# Patient Record
Sex: Female | Born: 1945 | Race: White | Hispanic: No | Marital: Married | Smoking: Never smoker
Health system: Southern US, Community
[De-identification: ages and names within clinical notes are randomized; demographics above are authoritative.]

## PROBLEM LIST (undated history)

## (undated) DIAGNOSIS — F0390 Unspecified dementia without behavioral disturbance: Secondary | ICD-10-CM

## (undated) DIAGNOSIS — D61818 Other pancytopenia: Secondary | ICD-10-CM

## (undated) DIAGNOSIS — F319 Bipolar disorder, unspecified: Secondary | ICD-10-CM

## (undated) DIAGNOSIS — I639 Cerebral infarction, unspecified: Secondary | ICD-10-CM

## (undated) HISTORY — PX: CHOLECYSTECTOMY: SHX55

## (undated) HISTORY — DX: Other pancytopenia: D61.818

---

## 1997-11-01 ENCOUNTER — Ambulatory Visit (HOSPITAL_COMMUNITY): Admission: RE | Admit: 1997-11-01 | Discharge: 1997-11-01 | Payer: Self-pay | Admitting: Specialist

## 1997-11-19 ENCOUNTER — Ambulatory Visit (HOSPITAL_COMMUNITY): Admission: RE | Admit: 1997-11-19 | Discharge: 1997-11-19 | Payer: Self-pay | Admitting: Specialist

## 1997-11-22 ENCOUNTER — Ambulatory Visit (HOSPITAL_COMMUNITY): Admission: RE | Admit: 1997-11-22 | Discharge: 1997-11-22 | Payer: Self-pay | Admitting: Specialist

## 1999-12-29 ENCOUNTER — Emergency Department (HOSPITAL_COMMUNITY): Admission: EM | Admit: 1999-12-29 | Discharge: 1999-12-29 | Payer: Self-pay | Admitting: Emergency Medicine

## 1999-12-29 ENCOUNTER — Encounter: Payer: Self-pay | Admitting: Emergency Medicine

## 2002-09-15 ENCOUNTER — Encounter: Payer: Self-pay | Admitting: Internal Medicine

## 2002-09-15 ENCOUNTER — Encounter: Admission: RE | Admit: 2002-09-15 | Discharge: 2002-09-15 | Payer: Self-pay | Admitting: Internal Medicine

## 2002-11-08 ENCOUNTER — Emergency Department (HOSPITAL_COMMUNITY): Admission: EM | Admit: 2002-11-08 | Discharge: 2002-11-08 | Payer: Self-pay | Admitting: Emergency Medicine

## 2003-03-08 ENCOUNTER — Inpatient Hospital Stay (HOSPITAL_COMMUNITY): Admission: EM | Admit: 2003-03-08 | Discharge: 2003-03-19 | Payer: Self-pay | Admitting: Psychiatry

## 2004-10-16 ENCOUNTER — Ambulatory Visit: Payer: Self-pay | Admitting: Oncology

## 2005-10-24 ENCOUNTER — Ambulatory Visit: Payer: Self-pay | Admitting: Oncology

## 2005-11-01 LAB — CBC WITH DIFFERENTIAL/PLATELET
BASO%: 0.2 % (ref 0.0–2.0)
Basophils Absolute: 0 10*3/uL (ref 0.0–0.1)
MCH: 28.7 pg (ref 26.0–34.0)
MCHC: 34.1 g/dL (ref 32.0–36.0)
MCV: 84.1 fL (ref 81.0–101.0)
MONO#: 0.3 10*3/uL (ref 0.1–0.9)
NEUT#: 3.7 10*3/uL (ref 1.5–6.5)
NEUT%: 80.4 % — ABNORMAL HIGH (ref 39.6–76.8)
Platelets: 139 10*3/uL — ABNORMAL LOW (ref 145–400)
RBC: 4.2 10*6/uL (ref 3.70–5.32)
RDW: 14.4 % (ref 11.3–14.5)

## 2005-11-01 LAB — COMPREHENSIVE METABOLIC PANEL
CO2: 24 mEq/L (ref 19–32)
Calcium: 9 mg/dL (ref 8.4–10.5)
Chloride: 102 mEq/L (ref 96–112)
Creatinine, Ser: 0.9 mg/dL (ref 0.4–1.2)
Glucose, Bld: 158 mg/dL — ABNORMAL HIGH (ref 70–99)
Total Bilirubin: 0.4 mg/dL (ref 0.3–1.2)
Total Protein: 6.6 g/dL (ref 6.0–8.3)

## 2005-11-01 LAB — VITAMIN B12: Vitamin B-12: 430 pg/mL (ref 211–911)

## 2005-11-01 LAB — LACTATE DEHYDROGENASE: LDH: 167 U/L (ref 94–250)

## 2005-11-13 ENCOUNTER — Ambulatory Visit (HOSPITAL_BASED_OUTPATIENT_CLINIC_OR_DEPARTMENT_OTHER): Admission: RE | Admit: 2005-11-13 | Discharge: 2005-11-13 | Payer: Self-pay | Admitting: Podiatry

## 2006-10-28 ENCOUNTER — Ambulatory Visit: Payer: Self-pay | Admitting: Oncology

## 2006-10-31 LAB — COMPREHENSIVE METABOLIC PANEL
ALT: 10 U/L (ref 0–35)
AST: 11 U/L (ref 0–37)
Albumin: 4.5 g/dL (ref 3.5–5.2)
Alkaline Phosphatase: 67 U/L (ref 39–117)
BUN: 26 mg/dL — ABNORMAL HIGH (ref 6–23)
Calcium: 9.2 mg/dL (ref 8.4–10.5)
Chloride: 106 mEq/L (ref 96–112)
Potassium: 4.9 mEq/L (ref 3.5–5.3)
Sodium: 143 mEq/L (ref 135–145)
Total Protein: 6.8 g/dL (ref 6.0–8.3)

## 2006-10-31 LAB — CBC WITH DIFFERENTIAL/PLATELET
Basophils Absolute: 0 10*3/uL (ref 0.0–0.1)
EOS%: 0.1 % (ref 0.0–7.0)
HGB: 11.9 g/dL (ref 11.6–15.9)
MCH: 29.3 pg (ref 26.0–34.0)
MONO%: 9.7 % (ref 0.0–13.0)
NEUT#: 1.3 10*3/uL — ABNORMAL LOW (ref 1.5–6.5)
RBC: 4.07 10*6/uL (ref 3.70–5.32)
RDW: 14.5 % (ref 11.3–14.5)
lymph#: 0.5 10*3/uL — ABNORMAL LOW (ref 0.9–3.3)

## 2006-10-31 LAB — VITAMIN B12: Vitamin B-12: 464 pg/mL (ref 211–911)

## 2007-07-28 ENCOUNTER — Encounter: Admission: RE | Admit: 2007-07-28 | Discharge: 2007-07-28 | Payer: Self-pay | Admitting: Internal Medicine

## 2007-10-28 ENCOUNTER — Ambulatory Visit: Payer: Self-pay | Admitting: Oncology

## 2007-10-30 LAB — LACTATE DEHYDROGENASE: LDH: 159 U/L (ref 94–250)

## 2007-10-30 LAB — COMPREHENSIVE METABOLIC PANEL
ALT: 19 U/L (ref 0–35)
AST: 10 U/L (ref 0–37)
Albumin: 4.6 g/dL (ref 3.5–5.2)
CO2: 25 mEq/L (ref 19–32)
Calcium: 9.3 mg/dL (ref 8.4–10.5)
Chloride: 108 mEq/L (ref 96–112)
Potassium: 4 mEq/L (ref 3.5–5.3)
Total Protein: 7 g/dL (ref 6.0–8.3)

## 2007-10-30 LAB — CBC WITH DIFFERENTIAL/PLATELET
BASO%: 0.6 % (ref 0.0–2.0)
EOS%: 0.4 % (ref 0.0–7.0)
HCT: 34 % — ABNORMAL LOW (ref 34.8–46.6)
HGB: 12 g/dL (ref 11.6–15.9)
MCH: 29.3 pg (ref 26.0–34.0)
MCHC: 35.3 g/dL (ref 32.0–36.0)
MONO#: 0.1 10*3/uL (ref 0.1–0.9)
NEUT%: 59.8 % (ref 39.6–76.8)
RDW: 14.5 % (ref 11.3–14.5)
WBC: 1.7 10*3/uL — ABNORMAL LOW (ref 3.9–10.0)
lymph#: 0.5 10*3/uL — ABNORMAL LOW (ref 0.9–3.3)

## 2007-10-30 LAB — VITAMIN B12: Vitamin B-12: 436 pg/mL (ref 211–911)

## 2008-08-17 ENCOUNTER — Ambulatory Visit: Payer: Self-pay | Admitting: Oncology

## 2008-08-19 LAB — CBC WITH DIFFERENTIAL/PLATELET
BASO%: 0.1 % (ref 0.0–2.0)
EOS%: 0.3 % (ref 0.0–7.0)
LYMPH%: 37.9 % (ref 14.0–48.0)
MCH: 28.7 pg (ref 26.0–34.0)
MCHC: 34.4 g/dL (ref 32.0–36.0)
MCV: 83.5 fL (ref 81.0–101.0)
MONO%: 7.8 % (ref 0.0–13.0)
Platelets: 111 10*3/uL — ABNORMAL LOW (ref 145–400)
RBC: 4.02 10*6/uL (ref 3.70–5.32)
RDW: 13.9 % (ref 11.3–14.5)

## 2008-08-19 LAB — COMPREHENSIVE METABOLIC PANEL
AST: 23 U/L (ref 0–37)
Albumin: 4.3 g/dL (ref 3.5–5.2)
Alkaline Phosphatase: 59 U/L (ref 39–117)
Glucose, Bld: 82 mg/dL (ref 70–99)
Potassium: 3.9 mEq/L (ref 3.5–5.3)
Sodium: 144 mEq/L (ref 135–145)
Total Bilirubin: 0.5 mg/dL (ref 0.3–1.2)
Total Protein: 6.6 g/dL (ref 6.0–8.3)

## 2008-08-19 LAB — VITAMIN B12: Vitamin B-12: 393 pg/mL (ref 211–911)

## 2008-09-05 ENCOUNTER — Inpatient Hospital Stay: Payer: Self-pay | Admitting: Unknown Physician Specialty

## 2008-09-15 ENCOUNTER — Ambulatory Visit: Payer: Self-pay | Admitting: Internal Medicine

## 2008-10-04 ENCOUNTER — Ambulatory Visit: Payer: Self-pay | Admitting: Unknown Physician Specialty

## 2008-10-16 ENCOUNTER — Inpatient Hospital Stay: Payer: Self-pay | Admitting: Unknown Physician Specialty

## 2008-10-21 ENCOUNTER — Ambulatory Visit: Payer: Self-pay | Admitting: Unknown Physician Specialty

## 2008-10-27 ENCOUNTER — Ambulatory Visit: Payer: Self-pay | Admitting: Oncology

## 2008-11-15 ENCOUNTER — Ambulatory Visit: Payer: Self-pay | Admitting: Unknown Physician Specialty

## 2008-11-20 ENCOUNTER — Ambulatory Visit: Payer: Self-pay | Admitting: Unknown Physician Specialty

## 2008-12-21 ENCOUNTER — Ambulatory Visit: Payer: Self-pay | Admitting: Unknown Physician Specialty

## 2009-01-26 ENCOUNTER — Ambulatory Visit: Payer: Self-pay | Admitting: Cardiovascular Disease

## 2009-01-26 ENCOUNTER — Inpatient Hospital Stay: Payer: Self-pay | Admitting: Unknown Physician Specialty

## 2009-02-11 ENCOUNTER — Ambulatory Visit: Payer: Self-pay | Admitting: Unknown Physician Specialty

## 2009-02-20 ENCOUNTER — Ambulatory Visit: Payer: Self-pay | Admitting: Unknown Physician Specialty

## 2009-03-23 ENCOUNTER — Ambulatory Visit: Payer: Self-pay | Admitting: Unknown Physician Specialty

## 2009-04-22 ENCOUNTER — Ambulatory Visit: Payer: Self-pay | Admitting: Unknown Physician Specialty

## 2009-05-24 ENCOUNTER — Inpatient Hospital Stay: Payer: Self-pay | Admitting: Unknown Physician Specialty

## 2009-06-15 ENCOUNTER — Ambulatory Visit: Payer: Self-pay | Admitting: Unknown Physician Specialty

## 2009-06-22 ENCOUNTER — Ambulatory Visit: Payer: Self-pay | Admitting: Unknown Physician Specialty

## 2009-07-25 ENCOUNTER — Ambulatory Visit: Payer: Self-pay | Admitting: Unknown Physician Specialty

## 2009-08-17 ENCOUNTER — Ambulatory Visit: Payer: Self-pay | Admitting: Oncology

## 2009-08-19 LAB — CBC WITH DIFFERENTIAL/PLATELET
BASO%: 0.2 % (ref 0.0–2.0)
Basophils Absolute: 0 10*3/uL (ref 0.0–0.1)
EOS%: 0.3 % (ref 0.0–7.0)
Eosinophils Absolute: 0 10*3/uL (ref 0.0–0.5)
HCT: 33 % — ABNORMAL LOW (ref 34.8–46.6)
HGB: 11.4 g/dL — ABNORMAL LOW (ref 11.6–15.9)
LYMPH%: 28 % (ref 14.0–49.7)
MCH: 29.8 pg (ref 25.1–34.0)
MCHC: 34.4 g/dL (ref 31.5–36.0)
MCV: 86.5 fL (ref 79.5–101.0)
MONO#: 0.1 10*3/uL (ref 0.1–0.9)
MONO%: 8.8 % (ref 0.0–14.0)
NEUT#: 1.1 10*3/uL — ABNORMAL LOW (ref 1.5–6.5)
NEUT%: 62.7 % (ref 38.4–76.8)
Platelets: 113 10*3/uL — ABNORMAL LOW (ref 145–400)
RBC: 3.82 10*6/uL (ref 3.70–5.45)
RDW: 13.8 % (ref 11.2–14.5)
WBC: 1.7 10*3/uL — ABNORMAL LOW (ref 3.9–10.3)
lymph#: 0.5 10*3/uL — ABNORMAL LOW (ref 0.9–3.3)

## 2009-08-19 LAB — COMPREHENSIVE METABOLIC PANEL
ALT: 29 U/L (ref 0–35)
AST: 14 U/L (ref 0–37)
Albumin: 4.5 g/dL (ref 3.5–5.2)
CO2: 26 mEq/L (ref 19–32)
Calcium: 9.1 mg/dL (ref 8.4–10.5)
Chloride: 106 mEq/L (ref 96–112)
Creatinine, Ser: 0.77 mg/dL (ref 0.40–1.20)
Potassium: 4.2 mEq/L (ref 3.5–5.3)

## 2009-08-19 LAB — LACTATE DEHYDROGENASE: LDH: 127 U/L (ref 94–250)

## 2009-08-23 ENCOUNTER — Ambulatory Visit: Payer: Self-pay | Admitting: Unknown Physician Specialty

## 2009-09-20 ENCOUNTER — Ambulatory Visit: Payer: Self-pay | Admitting: Unknown Physician Specialty

## 2009-10-21 ENCOUNTER — Ambulatory Visit: Payer: Self-pay | Admitting: Unknown Physician Specialty

## 2009-11-22 ENCOUNTER — Ambulatory Visit: Payer: Self-pay | Admitting: Unknown Physician Specialty

## 2009-12-21 ENCOUNTER — Ambulatory Visit: Payer: Self-pay | Admitting: Unknown Physician Specialty

## 2010-02-20 ENCOUNTER — Ambulatory Visit: Payer: Self-pay | Admitting: Unknown Physician Specialty

## 2010-04-24 ENCOUNTER — Ambulatory Visit: Payer: Self-pay | Admitting: Unknown Physician Specialty

## 2010-06-22 ENCOUNTER — Ambulatory Visit: Payer: Self-pay | Admitting: Unknown Physician Specialty

## 2010-08-13 ENCOUNTER — Encounter: Payer: Self-pay | Admitting: Internal Medicine

## 2010-08-16 ENCOUNTER — Ambulatory Visit: Payer: Self-pay | Admitting: Oncology

## 2010-08-18 LAB — CBC WITH DIFFERENTIAL/PLATELET
BASO%: 0.2 % (ref 0.0–2.0)
Basophils Absolute: 0 10*3/uL (ref 0.0–0.1)
EOS%: 0.6 % (ref 0.0–7.0)
Eosinophils Absolute: 0 10*3/uL (ref 0.0–0.5)
HCT: 35.6 % (ref 34.8–46.6)
HGB: 12.1 g/dL (ref 11.6–15.9)
LYMPH%: 33.8 % (ref 14.0–49.7)
MCH: 28.5 pg (ref 25.1–34.0)
MCHC: 34 g/dL (ref 31.5–36.0)
MCV: 83.8 fL (ref 79.5–101.0)
MONO#: 0.2 10*3/uL (ref 0.1–0.9)
MONO%: 7.3 % (ref 0.0–14.0)
NEUT#: 1.3 10*3/uL — ABNORMAL LOW (ref 1.5–6.5)
NEUT%: 58.1 % (ref 38.4–76.8)
Platelets: 96 10*3/uL — ABNORMAL LOW (ref 145–400)
RBC: 4.24 10*6/uL (ref 3.70–5.45)
RDW: 14.7 % — ABNORMAL HIGH (ref 11.2–14.5)
WBC: 2.2 10*3/uL — ABNORMAL LOW (ref 3.9–10.3)
lymph#: 0.7 10*3/uL — ABNORMAL LOW (ref 0.9–3.3)

## 2010-08-18 LAB — COMPREHENSIVE METABOLIC PANEL
ALT: 18 U/L (ref 0–35)
AST: 14 U/L (ref 0–37)
Albumin: 4.4 g/dL (ref 3.5–5.2)
Alkaline Phosphatase: 82 U/L (ref 39–117)
BUN: 24 mg/dL — ABNORMAL HIGH (ref 6–23)
CO2: 25 mEq/L (ref 19–32)
Calcium: 9.5 mg/dL (ref 8.4–10.5)
Chloride: 105 mEq/L (ref 96–112)
Creatinine, Ser: 0.85 mg/dL (ref 0.40–1.20)
Glucose, Bld: 103 mg/dL — ABNORMAL HIGH (ref 70–99)
Potassium: 4.3 mEq/L (ref 3.5–5.3)
Sodium: 140 mEq/L (ref 135–145)
Total Bilirubin: 0.3 mg/dL (ref 0.3–1.2)
Total Protein: 6.8 g/dL (ref 6.0–8.3)

## 2010-08-18 LAB — VITAMIN B12: Vitamin B-12: 263 pg/mL (ref 211–911)

## 2010-08-18 LAB — LACTATE DEHYDROGENASE: LDH: 126 U/L (ref 94–250)

## 2010-08-23 ENCOUNTER — Ambulatory Visit: Payer: Self-pay | Admitting: Unknown Physician Specialty

## 2010-11-21 ENCOUNTER — Ambulatory Visit: Payer: Self-pay | Admitting: Unknown Physician Specialty

## 2010-12-08 NOTE — Op Note (Signed)
[name], [name]                  ACCOUNT NO.:  [id number]   MEDICAL RECORD NO.:  [id number]          PATIENT TYPE:  AMB   LOCATION:  DSC                          FACILITY:  [facility]   PHYSICIAN:  Ezequiel Kayser. Ajlouny, D.P.M.DATE OF BIRTH:  1946/01/25   DATE OF PROCEDURE:  11/13/2005  DATE OF DISCHARGE:                                 OPERATIVE REPORT   PREOPERATIVE DIAGNOSIS:  Hallux limitus, left foot.   POSTOPERATIVE DIAGNOSIS:  Hallux limitus, left foot.   OPERATION PERFORMED:  First metatarsophalangeal joint implant, replacement,  left foot.   SURGEON:  Ezequiel Kayser. Ajlouny, D.P.M.   ANESTHESIA:  MAC with local.   COMPLICATIONS:  None.   DESCRIPTION OF PROCEDURE:  The patient was brought to the operating room and  placed in supine position at which time monitored anesthesia care was  administered.  The anesthesiologist administered local block.  A well-padded  pneumatic tourniquet was applied superior to the medial malleolus.  The  patient was prepped and draped in the usual aseptic manner.  The foot was  exsanguinated with an Esmarch bandage and the previously applied tourniquet  inflated to 250 mmHg.   Attention was directed first right where a dorsilinear incision was made.  The incision was deepened via sharp and blunt modality, taking care to clamp  and cauterize all bleeding vessels and ensuring retraction of all  neurovascular structures encountered.  The deep and superficial fascia were  separated medially and laterally the length of the incision.  Care was taken  to clamp, cauterize all bleeding vessels and ensure retraction of all  neurovascular structures throughout the entire case.  A linear incision was  made through the capsule.  The capsule was thin and in poor condition.  This  was reflected off the head of the first metatarsal and the base of the  proximal phalanx.  There was a moderate amount of hypertrophic bone lipping  both at the head of the first  metatarsal and even more so at the base of the  proximal phalanx.  There were erosions noted, the first MTP joint.  Once  periosteum and capsule were reflected, the medial eminence of the head of  the metatarsal  was resected.  Next, one third of the proximal phalanx at  the proximal end was resected and care was taken not to disturb the flexor  apparatus.  Next, the head of the first metatarsal was remodeled to be round  and congruous with the implant.  Using a Biopro hemi implant, a size small  implant was determined after the sizers were used to find the appropriate  size.  This was predrilled and placement of the size small Biopro implant  was performed using intraoperative radiographs for confirming placement of  the implant.  Range of motion was found to be excellent and there were no  points of crepitus.  The surgical wound was irrigated before and after  placement of the implant.  The remaining capsule was reapproximated  utilizing 3-0 Vicryl although much of it was very thin and atrophic.  Subcutaneous closure was accomplished using  4-0 Vicryl and skin closure was  accomplished using 4-0 Monocryl.  Postoperatively 5 mL of 0.5% Marcaine  plain and 1 mL of Depo-Medrol was injected around the surgical site.  Foot  was dressed with Steri-Strips, Adaptic, 4 x 4s, Kling and Coban.  The tourniquet was deflated and vascular status returned to all digits.  The  patient was then transferred to the recovery room with vital signs stable  with capillary refill time __________ .  Both written and oral postoperative  instructions were given.  Postoperative shoe was dispensed.  No guarantees  given.  All questions answered.           ______________________________  Ezequiel Kayser. Harriet Pho, D.P.M.     MJA/MEDQ  D:  11/13/2005  T:  11/14/2005  Job:  [id number]
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
## 2011-02-21 ENCOUNTER — Ambulatory Visit: Payer: Self-pay | Admitting: Unknown Physician Specialty

## 2011-05-24 ENCOUNTER — Ambulatory Visit: Payer: Self-pay | Admitting: Unknown Physician Specialty

## 2011-07-16 ENCOUNTER — Telehealth: Payer: Self-pay | Admitting: Oncology

## 2011-07-16 NOTE — Telephone Encounter (Signed)
1/28 appt converted to epic and moved to 1/31 @ 10 am. Not able to reach pt re appt. Jan schedule mailed today.

## 2011-07-25 ENCOUNTER — Ambulatory Visit: Payer: Self-pay | Admitting: Unknown Physician Specialty

## 2011-08-23 ENCOUNTER — Ambulatory Visit: Payer: Self-pay | Admitting: Physician Assistant

## 2011-08-23 ENCOUNTER — Other Ambulatory Visit: Payer: Self-pay | Admitting: Lab

## 2011-08-24 ENCOUNTER — Ambulatory Visit: Payer: Self-pay | Admitting: Unknown Physician Specialty

## 2011-09-21 ENCOUNTER — Ambulatory Visit: Payer: Self-pay | Admitting: Unknown Physician Specialty

## 2011-10-22 ENCOUNTER — Ambulatory Visit: Payer: Self-pay | Admitting: Unknown Physician Specialty

## 2011-11-21 ENCOUNTER — Ambulatory Visit: Payer: Self-pay | Admitting: Unknown Physician Specialty

## 2012-01-21 ENCOUNTER — Ambulatory Visit: Payer: Self-pay | Admitting: Unknown Physician Specialty

## 2012-02-21 ENCOUNTER — Ambulatory Visit: Payer: Self-pay | Admitting: Unknown Physician Specialty

## 2013-03-23 ENCOUNTER — Ambulatory Visit: Payer: Self-pay | Admitting: Psychiatry

## 2013-12-10 ENCOUNTER — Emergency Department (HOSPITAL_COMMUNITY): Payer: Medicare Other

## 2013-12-10 ENCOUNTER — Emergency Department (INDEPENDENT_AMBULATORY_CARE_PROVIDER_SITE_OTHER): Payer: Medicare Other

## 2013-12-10 ENCOUNTER — Emergency Department (INDEPENDENT_AMBULATORY_CARE_PROVIDER_SITE_OTHER)
Admission: EM | Admit: 2013-12-10 | Discharge: 2013-12-10 | Disposition: A | Discharge status: Home / Self Care | Payer: Medicare Other | Source: Home / Self Care | Attending: Family Medicine | Admitting: Family Medicine

## 2013-12-10 ENCOUNTER — Encounter (HOSPITAL_COMMUNITY): Payer: Self-pay | Admitting: Emergency Medicine

## 2013-12-10 DIAGNOSIS — W19XXXA Unspecified fall, initial encounter: Secondary | ICD-10-CM

## 2013-12-10 DIAGNOSIS — Y92009 Unspecified place in unspecified non-institutional (private) residence as the place of occurrence of the external cause: Secondary | ICD-10-CM

## 2013-12-10 DIAGNOSIS — M549 Dorsalgia, unspecified: Secondary | ICD-10-CM

## 2013-12-10 HISTORY — DX: Unspecified dementia, unspecified severity, without behavioral disturbance, psychotic disturbance, mood disturbance, and anxiety: F03.90

## 2013-12-10 HISTORY — DX: Bipolar disorder, unspecified: F31.9

## 2013-12-10 MED ORDER — HYDROCODONE-ACETAMINOPHEN 5-325 MG PO TABS: 0.5000 | ORAL_TABLET | Freq: Four times a day (QID) | ORAL | Status: DC | PRN

## 2013-12-10 NOTE — Discharge Instructions (Signed)
Back Pain, Adult °Low back pain is very common. About 1 in 5 people have back pain. The cause of low back pain is rarely dangerous. The pain often gets better over time. About half of people with a sudden onset of back pain feel better in just 2 weeks. About 8 in 10 people feel better by 6 weeks.  °CAUSES °Some common causes of back pain include: °· Strain of the muscles or ligaments supporting the spine. °· Wear and tear (degeneration) of the spinal discs. °· Arthritis. °· Direct injury to the back. °DIAGNOSIS °Most of the time, the direct cause of low back pain is not known. However, back pain can be treated effectively even when the exact cause of the pain is unknown. Answering your caregiver's questions about your overall health and symptoms is one of the most accurate ways to make sure the cause of your pain is not dangerous. If your caregiver needs more information, he or she may order lab work or imaging tests (X-rays or MRIs). However, even if imaging tests show changes in your back, this usually does not require surgery. °HOME CARE INSTRUCTIONS °For many people, back pain returns. Since low back pain is rarely dangerous, it is often a condition that people can learn to manage on their own.  °· Remain active. It is stressful on the back to sit or stand in one place. Do not sit, drive, or stand in one place for more than 30 minutes at a time. Take short walks on level surfaces as soon as pain allows. Try to increase the length of time you walk each day. °· Do not stay in bed. Resting more than 1 or 2 days can delay your recovery. °· Do not avoid exercise or work. Your body is made to move. It is not dangerous to be active, even though your back may hurt. Your back will likely heal faster if you return to being active before your pain is gone. °· Pay attention to your body when you  bend and lift. Many people have less discomfort when lifting if they bend their knees, keep the load close to their bodies, and  avoid twisting. Often, the most comfortable positions are those that put less stress on your recovering back. °· Find a comfortable position to sleep. Use a firm mattress and lie on your side with your knees slightly bent. If you lie on your back, put a pillow under your knees. °· Only take over-the-counter or prescription medicines as directed by your caregiver. Over-the-counter medicines to reduce pain and inflammation are often the most helpful. Your caregiver may prescribe muscle relaxant drugs. These medicines help dull your pain so you can more quickly return to your normal activities and healthy exercise. °· Put ice on the injured area. °· Put ice in a plastic bag. °· Place a towel between your skin and the bag. °· Leave the ice on for 15-20 minutes, 03-04 times a day for the first 2 to 3 days. After that, ice and heat may be alternated to reduce pain and spasms. °· Ask your caregiver about trying back exercises and gentle massage. This may be of some benefit. °· Avoid feeling anxious or stressed. Stress increases muscle tension and can worsen back pain. It is important to recognize when you are anxious or stressed and learn ways to manage it. Exercise is a great option. °SEEK MEDICAL CARE IF: °· You have pain that is not relieved with rest or medicine. °· You have pain that does not improve in 1 week. °· You have new symptoms. °· You are generally not feeling well. °SEEK   IMMEDIATE MEDICAL CARE IF:  °· You have pain that radiates from your back into your legs. °· You develop new bowel or bladder control problems. °· You have unusual weakness or numbness in your arms or legs. °· You develop nausea or vomiting. °· You develop abdominal pain. °· You feel faint. °Document Released: 07/09/2005 Document Revised: 01/08/2012 Document Reviewed: 11/27/2010 °ExitCare® Patient Information ©2014 [employer]. ° ° °Fall Prevention and Home Safety °Falls cause injuries and can affect all age groups. It is possible to use  preventive measures to significantly decrease the likelihood of falls. There are many simple measures which can make your home safer and prevent falls. °OUTDOORS °· Repair cracks and edges of walkways and driveways. °· Remove high doorway thresholds. °· Trim shrubbery on the main path into your home. °· Have good outside lighting. °· Clear walkways of tools, rocks, debris, and clutter. °· Check that handrails are not broken and are securely fastened. Both sides of steps should have handrails. °· Have leaves, snow, and ice cleared regularly. °· Use sand or salt on walkways during winter months. °· In the garage, clean up grease or oil spills. °BATHROOM °· Install night lights. °· Install grab bars by the toilet and in the tub and shower. °· Use non-skid mats or decals in the tub or shower. °· Place a plastic non-slip stool in the shower to sit on, if needed. °· Keep floors dry and clean up all water on the floor immediately. °· Remove soap buildup in the tub or shower on a regular basis. °· Secure bath mats with non-slip, double-sided rug tape. °· Remove throw rugs and tripping hazards from the floors. °BEDROOMS °· Install night lights. °· Make sure a bedside light is easy to reach. °· Do not use oversized bedding. °· Keep a telephone by your bedside. °· Have a firm chair with side arms to use for getting dressed. °· Remove throw rugs and tripping hazards from the floor. °KITCHEN °· Keep handles on pots and pans turned toward the center of the stove. Use back burners when possible. °· Clean up spills quickly and allow time for drying. °· Avoid walking on wet floors. °· Avoid hot utensils and knives. °· Position shelves so they are not too high or low. °· Place commonly used objects within easy reach. °· If necessary, use a sturdy step stool with a grab bar when reaching. °· Keep electrical cables out of the way. °· Do not use floor polish or wax that makes floors slippery. If you must use wax, use non-skid floor  wax. °· Remove throw rugs and tripping hazards from the floor. °STAIRWAYS °· Never leave objects on stairs. °· Place handrails on both sides of stairways and use them. Fix any loose handrails. Make sure handrails on both sides of the stairways are as long as the stairs. °· Check carpeting to make sure it is firmly attached along stairs. Make repairs to worn or loose carpet promptly. °· Avoid placing throw rugs at the top or bottom of stairways, or properly secure the rug with carpet tape to prevent slippage. Get rid of throw rugs, if possible. °· Have an electrician put in a light switch at the top and bottom of the stairs. °OTHER FALL PREVENTION TIPS °· Wear low-heel or rubber-soled shoes that are supportive and fit well. Wear closed toe shoes. °· When using a stepladder, make sure it is fully opened and both spreaders are firmly locked. Do not climb a closed stepladder. °·   Add color or contrast paint or tape to grab bars and handrails in your home. Place contrasting color strips on first and last steps. °· Learn and use mobility aids as needed. Install an electrical emergency response system. °· Turn on lights to avoid dark areas. Replace light bulbs that burn out immediately. Get light switches that glow. °· Arrange furniture to create clear pathways. Keep furniture in the same place. °· Firmly attach carpet with non-skid or double-sided tape. °· Eliminate uneven floor surfaces. °· Select a carpet pattern that does not visually hide the edge of steps. °· Be aware of all pets. °OTHER HOME SAFETY TIPS °· Set the water temperature for 120° F (48.8° C). °· Keep emergency numbers on or near the telephone. °· Keep smoke detectors on every level of the home and near sleeping areas. °Document Released: 06/29/2002 Document Revised: 01/08/2012 Document Reviewed: 09/28/2011 °ExitCare® Patient Information ©2014 [employer]. ° °

## 2013-12-10 NOTE — ED Notes (Signed)
Fall reported 5/20.  Reports pain in mid back and pain across waist line.  Patient has been using heating pad, soaking in hot water, and lying down

## 2013-12-10 NOTE — ED Provider Notes (Signed)
CSN: [id number]     Arrival date & time 12/10/13  1610 History   First MD Initiated Contact with Patient 12/10/13 1716     Chief Complaint  Patient presents with  . Fall   (Consider location/radiation/quality/duration/timing/severity/associated sxs/prior Treatment) HPI Comments: 68 year old female with history of polio, bipolar disorder, dementia is brought in by her husband for evaluation of back pain after a fall. She had an unwitnessed fall in her kitchen 2 days ago. Since then she has constant pain. It is worse with any weightbearing activity. No loss of bowel or bladder control, no extremity weakness or numbness.  Patient is a 68 y.o. female presenting with fall.  Fall    Past Medical History  Diagnosis Date  . Bipolar 1 disorder   . Dementia    Past Surgical History  Procedure Laterality Date  . Cholecystectomy     No family history on file. History  Substance Use Topics  . Smoking status: Never Smoker   . Smokeless tobacco: Not on file  . Alcohol Use: No   OB History   Grav Para Term Preterm Abortions TAB SAB Ect Mult Living                 Review of Systems  Unable to perform ROS: Dementia  Musculoskeletal: Positive for back pain and gait problem.  All other systems reviewed and are negative.   Allergies  Review of patient's allergies indicates no known allergies.  Home Medications   Prior to Admission medications   Medication Sig Start Date End Date Taking? Authorizing Provider  OVER THE COUNTER MEDICATION "don't have them with us"- "to many to recall"   Yes Historical Provider, MD   BP 121/60  Pulse 79  Temp(Src) 98.8 F (37.1 C) (Oral)  Resp 12  SpO2 99% Physical Exam  Nursing note and vitals reviewed. Constitutional: She is oriented to person, place, and time. Vital signs are normal. She appears well-developed and well-nourished. No distress.  HENT:  Head: Normocephalic and atraumatic.  Pulmonary/Chest: Effort normal. No respiratory distress.   Musculoskeletal:       Thoracic back: She exhibits bony tenderness (diffuse). She exhibits no deformity.       Lumbar back: She exhibits bony tenderness (Diffuse, nonfocal). She exhibits no deformity.  Neurological: She is alert and oriented to person, place, and time. She has normal strength. Coordination normal.  Skin: Skin is warm and dry. No rash noted. She is not diaphoretic.  Psychiatric: She has a normal mood and affect. Judgment normal.    ED Course  Procedures (including critical care time) Labs Review Labs Reviewed - No data to display  Imaging Review Dg Thoracic Spine 2 View  12/10/2013   CLINICAL DATA:  Pain post trauma  EXAM: THORACIC SPINE - 3 VIEW  COMPARISON:  July 28, 2007  FINDINGS: Frontal, lateral, and swimmer's views were obtained. There is chronic anterior wedging of the T5 vertebral body. There is mild endplate concavity at several lower thoracic and upper lumbar levels consistent with osteoporosis. No new fracture is seen. There is no spondylolisthesis. There is upper thoracic levoscoliosis. There is disk space narrowing at all levels. No erosive change.  IMPRESSION: Changes of osteoporosis. Stable anterior wedging the T5 vertebral body. No acute fracture or spondylolisthesis. Multilevel osteoarthritic change.   Electronically Signed   By: Bretta BangWilliam  Woodruff M.D.   On: 12/10/2013 18:27   Dg Lumbar Spine Complete  12/10/2013   CLINICAL DATA:  Pain status post trauma  EXAM: LUMBAR  SPINE - COMPLETE 4+ VIEW  COMPARISON:  None.  FINDINGS: There is no evidence of lumbar spine fracture. Alignment is normal. Intervertebral disc spaces are maintained. Atherosclerotic calcification in the aorta.  IMPRESSION: Negative.   Electronically Signed   By: Salome HolmesHector  Cooper M.D.   On: 12/10/2013 18:36     MDM   1. Fall   2. Back pain     Patient is ambulatory, making any acute fracture involving the hips or pelvis very unlikely. We'll get x-rays to rule out any type of compression  fracture.  X-rays negative. We'll treat with a low dose of Norco as needed for pain. Followup with primary care as needed  Meds ordered this encounter  Medications  . OVER THE COUNTER MEDICATION    Sig: "don't have them with us"- "to many to recall"  . HYDROcodone-acetaminophen (NORCO) 5-325 MG per tablet    Sig: Take 0.5-1 tablets by mouth every 6 (six) hours as needed for moderate pain.    Dispense:  20 tablet    Refill:  0    Order Specific Question:  Supervising Provider    Answer:  Clementeen GrahamOREY, EVAN, Kathie RhodesS [[id number]]     Graylon GoodZachary H Chadwick Reiswig, PA-C 12/10/13 [phone]

## 2013-12-14 NOTE — ED Provider Notes (Signed)
Medical screening examination/treatment/procedure(s) were performed by a resident physician or non-physician practitioner and as the supervising physician I was immediately available for consultation/collaboration.  Emersyn Wyss, MD    Bryanne Riquelme S Daneka Lantigua, MD 12/14/13 0926 

## 2014-01-12 ENCOUNTER — Other Ambulatory Visit: Payer: Self-pay | Admitting: Family Medicine

## 2014-01-12 DIAGNOSIS — R140 Abdominal distension (gaseous): Secondary | ICD-10-CM

## 2014-01-12 DIAGNOSIS — R634 Abnormal weight loss: Secondary | ICD-10-CM

## 2014-01-14 ENCOUNTER — Encounter (INDEPENDENT_AMBULATORY_CARE_PROVIDER_SITE_OTHER): Payer: Self-pay

## 2014-01-14 ENCOUNTER — Other Ambulatory Visit: Payer: Self-pay | Admitting: Family Medicine

## 2014-01-14 ENCOUNTER — Ambulatory Visit
Admission: RE | Admit: 2014-01-14 | Discharge: 2014-01-14 | Disposition: A | Discharge status: Home / Self Care | Payer: Medicare Other | Source: Ambulatory Visit | Attending: Family Medicine | Admitting: Family Medicine

## 2014-01-14 DIAGNOSIS — R140 Abdominal distension (gaseous): Secondary | ICD-10-CM

## 2014-01-14 DIAGNOSIS — R634 Abnormal weight loss: Secondary | ICD-10-CM

## 2014-04-20 ENCOUNTER — Other Ambulatory Visit (HOSPITAL_COMMUNITY): Payer: Self-pay | Admitting: Respiratory Therapy

## 2014-04-20 ENCOUNTER — Other Ambulatory Visit: Payer: Self-pay | Admitting: Internal Medicine

## 2014-04-20 DIAGNOSIS — R2681 Unsteadiness on feet: Secondary | ICD-10-CM

## 2014-04-20 DIAGNOSIS — R569 Unspecified convulsions: Secondary | ICD-10-CM

## 2014-04-20 DIAGNOSIS — R296 Repeated falls: Secondary | ICD-10-CM

## 2014-04-21 ENCOUNTER — Ambulatory Visit
Admission: RE | Admit: 2014-04-21 | Discharge: 2014-04-21 | Disposition: A | Discharge status: Home / Self Care | Payer: Medicare Other | Source: Ambulatory Visit | Attending: Internal Medicine | Admitting: Internal Medicine

## 2014-04-21 DIAGNOSIS — R2681 Unsteadiness on feet: Secondary | ICD-10-CM

## 2014-04-21 DIAGNOSIS — R296 Repeated falls: Secondary | ICD-10-CM

## 2014-04-30 ENCOUNTER — Ambulatory Visit (HOSPITAL_COMMUNITY)
Admission: RE | Admit: 2014-04-30 | Discharge: 2014-04-30 | Disposition: A | Discharge status: Home / Self Care | Payer: Medicare Other | Source: Ambulatory Visit | Attending: Internal Medicine | Admitting: Internal Medicine

## 2014-04-30 DIAGNOSIS — R569 Unspecified convulsions: Secondary | ICD-10-CM

## 2014-04-30 NOTE — Progress Notes (Signed)
EEG completed; results pending.    

## 2014-04-30 NOTE — Procedures (Signed)
ELECTROENCEPHALOGRAM REPORT   Patient: Sharon [name]       Room #: OP EEG No. ID: [id number] Age: 68 y.o.        Sex: female Referring Physician: Timothy Lassousso Report Date:  04/30/2014        Interpreting Physician: Thana FarrEYNOLDS,Traquan Duarte D  History: Sharon [name] is an 68 y.o. female with repetitive falls of unknown etiology  Medications:  Tropium chloride, Quetiapine fumarate, Tamsulosin, Citalopram  Conditions of Recording:  This is a 16 channel EEG carried out with the patient in the awake and drowsy states.  Description:  The waking background activity consists of a low voltage, symmetrical, fairly well organized, 10 Hz alpha activity, seen from the parieto-occipital and posterior temporal regions.  Low voltage fast activity, poorly organized, is seen anteriorly and is at times superimposed on more posterior regions.  A mixture of theta and alpha rhythms are seen from the central and temporal regions.  Frontal activity is marred by motion artifact from persistent eye movements.  They are present throughout the recording.   The patient drowses with slowing to irregular, low voltage theta and beta activity.   Stage II sleep is not obtained. Hyperventilation and intermittent photic stimulation were not performed.  IMPRESSION: This is a normal awake and drowsy electroencephalogram despite artifact.  No epileptiform activity is noted.      Thana FarrLeslie Domonick Sittner, MD [facility] [phone] 04/30/2014, 3:59 PM

## 2014-06-03 ENCOUNTER — Telehealth: Payer: Self-pay | Admitting: Hematology

## 2014-06-03 ENCOUNTER — Encounter: Payer: Self-pay | Admitting: Hematology

## 2014-06-03 ENCOUNTER — Ambulatory Visit (HOSPITAL_BASED_OUTPATIENT_CLINIC_OR_DEPARTMENT_OTHER): Payer: Medicare Other | Admitting: Hematology

## 2014-06-03 ENCOUNTER — Ambulatory Visit (HOSPITAL_BASED_OUTPATIENT_CLINIC_OR_DEPARTMENT_OTHER): Payer: Medicare Other

## 2014-06-03 VITALS — BP 111/51 | HR 76 | Temp 98.0°F | Resp 18 | Ht 64.0 in | Wt 112.3 lb

## 2014-06-03 DIAGNOSIS — D72819 Decreased white blood cell count, unspecified: Secondary | ICD-10-CM

## 2014-06-03 DIAGNOSIS — F319 Bipolar disorder, unspecified: Secondary | ICD-10-CM

## 2014-06-03 DIAGNOSIS — D649 Anemia, unspecified: Secondary | ICD-10-CM

## 2014-06-03 DIAGNOSIS — D61818 Other pancytopenia: Secondary | ICD-10-CM

## 2014-06-03 HISTORY — DX: Other pancytopenia: D61.818

## 2014-06-03 LAB — CBC & DIFF AND RETIC
BASO%: 0 % (ref 0.0–2.0)
Basophils Absolute: 0 10*3/uL (ref 0.0–0.1)
EOS%: 0 % (ref 0.0–7.0)
Eosinophils Absolute: 0 10*3/uL (ref 0.0–0.5)
HEMATOCRIT: 30.2 % — AB (ref 34.8–46.6)
HGB: 9.9 g/dL — ABNORMAL LOW (ref 11.6–15.9)
IMMATURE RETIC FRACT: 6.4 % (ref 1.60–10.00)
LYMPH#: 0.4 10*3/uL — AB (ref 0.9–3.3)
LYMPH%: 28.4 % (ref 14.0–49.7)
MCH: 28.9 pg (ref 25.1–34.0)
MCHC: 32.8 g/dL (ref 31.5–36.0)
MCV: 88 fL (ref 79.5–101.0)
MONO#: 0.2 10*3/uL (ref 0.1–0.9)
MONO%: 12.9 % (ref 0.0–14.0)
NEUT#: 0.9 10*3/uL — ABNORMAL LOW (ref 1.5–6.5)
NEUT%: 58.7 % (ref 38.4–76.8)
Platelets: 106 10*3/uL — ABNORMAL LOW (ref 145–400)
RBC: 3.43 10*6/uL — AB (ref 3.70–5.45)
RDW: 13.8 % (ref 11.2–14.5)
RETIC %: 2.2 % — AB (ref 0.70–2.10)
Retic Ct Abs: 75.46 10*3/uL (ref 33.70–90.70)
WBC: 1.6 10*3/uL — AB (ref 3.9–10.3)

## 2014-06-03 LAB — LACTATE DEHYDROGENASE (CC13): LDH: 155 U/L (ref 125–245)

## 2014-06-03 NOTE — Progress Notes (Signed)
[place] CONSULT NOTE  Patient Care Team: Precious Reel, MD as PCP - General (Internal Medicine)  Dr. Sheralyn Boatman (psychiatrist)   CHIEF COMPLAINTS/PURPOSE OF CONSULTATION:  Pancytopenia   HISTORY OF PRESENTING ILLNESS:  Sharon [name] is a  68 y.o. female with past medical history significant for long-standing bipolar and mild pancytopenia, who is being referred by her primary care physician Dr. Harrington Challenger for follow-up her cytopenia. She was previously seen by our hematologist Dr. Ralene Ok with the last visit in 2012.  She has strong family history of bipolar, and was diagnosed with bipolar about 36 years ago after she gave birth to her son. She has been on antipsychotic medication since then. 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 This was switched to another cycle medication including Depakote, which was switched to [facility] about 4-5 years ago.Per Dr. Nolon Lennert note and patient's husband, she was initially noticed to have mild leukopenia and anemia in 1999. At that time she was noticed to have low B12 level, a bone marrow biopsy was done which was unremarkable. She was suggested to take B12 supplement. I do not have all her previous blood counts available, but she did have a normal white blood cell counts in April 2007, and her white counts has been around 1.5-2 since 2008. Her hemoglobin has been around 11-12. She also has mild borderline thrombocytopenia intermittently. She had multiple episodes of urinary tract infection in the past decade, however no other history of severe infections. She has never required blood transfusion. No history of bleeding. She was recently seen by her primary care physician Dr. Harrington Challenger, and hope blood work on 04/20/2014 showed WBC 1.9K/ul ANC 1.0K/ul, lymphocytes 0.8K/ul,  hemoglobin 10.3g/dl HCT 29.5% and MCV 88.6 MCHC 34.9 and platelet count 157K. Complete metabolic panel was unremarkable. Vitamin B12  level was 1245 pg/ml which is above the upper limit of normal range. TSH was normal vitamin D level was normal.  Per her husband, she has been followed up by her psychiatrist. She does have mild episodes of manic, but overall mood is stable. She has had multiple fall and unstable gait lately. This was evaluated by a neurologist, brain MRI showedmild generalized atrophy and white matter disease. 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  Patient states she feels well overall. She denies any pain, cough, fever, nausea, or change of her bowel habits. She has good appetite and her weight has been stable.  MEDICAL HISTORY:  Past Medical History  Diagnosis Date  . Bipolar 1 disorder   . Dementia   . Other pancytopenia 06/03/2014    SURGICAL HISTORY: Past Surgical History  Procedure Laterality Date  . Cholecystectomy      SOCIAL HISTORY: History   Social History  . Marital Status: Married    Spouse Name: N/A    Number of Children: N/A  . Years of Education: N/A   Occupational History  . Not on file.   Social History Main Topics  . Smoking status: Never Smoker   . Smokeless tobacco: Never Used  . Alcohol Use: No  . Drug Use: No  . Sexual Activity: Not on file   Other Topics Concern  . Not on file   Social History Narrative    FAMILY HISTORY: Family History  Problem Relation Age of Onset  . Cancer Mother   . Heart attack Father     ALLERGIES:  has No Known Allergies.  MEDICATIONS:  Current  Outpatient Prescriptions  Medication Sig Dispense Refill  . Cholecalciferol (VITAMIN D3) 1000 UNITS CAPS Take 1 capsule by mouth daily.    . citalopram (CELEXA) 40 MG tablet Take 20 mg by mouth.  1  . cyanocobalamin 1000 MCG tablet Take 100 mcg by mouth at bedtime.    . Fish Oil-Cholecalciferol (FISH OIL + D3) 1200-1000 MG-UNIT CAPS Take 1 tablet by mouth daily.    Marland Kitchen FLUZONE HIGH-DOSE 0.5 ML SUSY   0  . ibuprofen (ADVIL,MOTRIN) 600 MG tablet Take 600 mg by mouth at  bedtime as needed.    Marland Kitchen QUEtiapine (SEROQUEL) 300 MG tablet     . tamsulosin (FLOMAX) 0.4 MG CAPS capsule   3  . trospium (SANCTURA) 20 MG tablet   11   No current facility-administered medications for this visit.    REVIEW OF SYSTEMS:   Constitutional: Denies fevers, chills or abnormal night sweats Eyes: Denies blurriness of vision, double vision or watery eyes Ears, nose, mouth, throat, and face: Denies mucositis or sore throat Respiratory: Denies cough, dyspnea or wheezes Cardiovascular: Denies palpitation, chest discomfort or lower extremity swelling Gastrointestinal:  Denies nausea, heartburn or change in bowel habits Skin: Denies abnormal skin rashes Lymphatics: Denies new lymphadenopathy or easy bruising Neurological:Denies numbness, tingling or new weaknesses Behavioral/Psych: positive for history of bipolar. Mood is stable, mild manic episodes,no new changes  All other systems were reviewed with the patient and are negative.  PHYSICAL EXAMINATION:   Filed Vitals:   06/03/14 1221  BP: 111/51  Pulse: 76  Temp: 98 F (36.7 C)  Resp: 18   Filed Weights   06/03/14 1221  Weight: 112 lb 4.8 oz (50.939 kg)    GENERAL:alert, no distress and comfortable SKIN: appears pale, skin texture, turgor are normal, no rashes or significant lesions EYES: normal, conjunctiva are pink and non-injected, sclera clear OROPHARYNX:no exudate, no erythema and lips, buccal mucosa, and tongue normal  NECK: supple, thyroid normal size, non-tender, without nodularity LYMPH:  no palpable lymphadenopathy in the cervical, axillary or inguinal LUNGS: clear to auscultation and percussion with normal breathing effort HEART: regular rate & rhythm and no murmurs and no lower extremity edema ABDOMEN:abdomen soft, non-tender and normal bowel sounds Musculoskeletal:no cyanosis of digits and no clubbing  PSYCH: alert & oriented x 3 with fluent speech,  NEURO: no focal motor/sensory deficits, her gait is  slightly unsteady.  LABORATORY DATA:  I have reviewed the data as listed in history     RADIOGRAPHIC STUDIES: BRAIN MRI 04/21/14 IMPRESSION: 1. No acute or focal lesion to explain the patient's symptoms. 2. Mild generalized atrophy and white matter disease is somewhat advanced. This likely reflects the sequela of chronic microvascular ischemia. 3. 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  ASSESSMENT & PLAN:  1. 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 Her leukopenia (neutropenia) has been stable and mild overall. This has not been changed over the past 16 years. She has not had severe infection episodes except some UTI. Anemia has appeared slightly worse compared to 3 years ago. This is normocytic anemia and she has been taking B12 supplement. Her B12 level is slightly above normal. I think this is overall likely related to her psychiatry medication. Depakote and Seroquel can certainly cause cytopenia. Given her long-standing history of mild pancytopenia, this appears to be a benign process, very unlikely to be malignant bone marrow disease. She did have a normal bone marrow biopsy in 1999. I do not feel we need  to repeat a marrow biopsy at this point.  However, other causes of cytopenia are not excluded. I would like to obtain a iron study, particularly count, folic acid level, LDH, haptoglobin, SPEP for her anemia worup, and I'll review her peripheral blood smear. I'll also discuss with her psychiatrist, to see if her Seroquel can be switched to another medication which will not affect her blood counts. However giving her long-standing history of bipolar and intermittent symptoms, she is unlikely to be taken off psych medication.  I plan to see her back in 3 months, with a repeat CBC to see the trend of her cytopenia.  2. Bipolar: She will follow up with her psychiatrist [name].   Orders Placed This Encounter  Procedures  . US Abdomen  Complete    Standing Status: Future     Number of Occurrences:      Standing Expiration Date: 06/03/2015    Order Specific Question:  Reason for Exam (SYMPTOM  OR DIAGNOSIS REQUIRED)    Answer:  pancytopenia, evaluate liver and spleen    Order Specific Question:  Preferred imaging location?    Answer:  [facility]  . Ferritin    Standing Status: Future     Number of Occurrences: 1     Standing Expiration Date: 06/03/2015  . Iron and TIBC [facility]    Standing Status: Future     Number of Occurrences: 1     Standing Expiration Date: 06/03/2015  . Lactate dehydrogenase (LDH) - [facility]    Standing Status: Future     Number of Occurrences: 1     Standing Expiration Date: 06/03/2015  . CBC & Diff and Retic    Standing Status: Future     Number of Occurrences: 1     Standing Expiration Date: 06/03/2015  . Methylmalonic acid, serum    Standing Status: Future     Number of Occurrences: 1     Standing Expiration Date: 06/03/2015  . Folate RBC    Standing Status: Future     Number of Occurrences: 1     Standing Expiration Date: 06/03/2015  . SPEP with reflex to IFE    Standing Status: Future     Number of Occurrences: 1     Standing Expiration Date: 06/03/2015  . CBC & Diff and Retic    Standing Status: Future     Number of Occurrences:      Standing Expiration Date: 06/03/2015    All questions were answered. The patient knows to call the clinic with any problems, questions or concerns. I spent 20 minutes counseling the patient face to face. The total time spent in the appointment was 40 minutes and more than 50% was on counseling.     Truitt Merle, MD 06/03/2014 10:09 PM

## 2014-06-03 NOTE — Telephone Encounter (Signed)
Gave avs & cal for Feb 2016. °

## 2014-06-03 NOTE — Telephone Encounter (Signed)
Delivered chart 06/03/14

## 2014-06-04 LAB — IRON AND TIBC CHCC
%SAT: 9 % — AB (ref 21–57)
Iron: 27 ug/dL — ABNORMAL LOW (ref 41–142)
TIBC: 291 ug/dL (ref 236–444)
UIBC: 264 ug/dL (ref 120–384)

## 2014-06-04 LAB — FERRITIN CHCC: Ferritin: 31 ng/ml (ref 9–269)

## 2014-06-07 ENCOUNTER — Ambulatory Visit (HOSPITAL_COMMUNITY): Payer: Medicare Other

## 2014-06-07 ENCOUNTER — Ambulatory Visit (HOSPITAL_COMMUNITY)
Admission: RE | Admit: 2014-06-07 | Discharge: 2014-06-07 | Disposition: A | Discharge status: Home / Self Care | Payer: Medicare Other | Source: Ambulatory Visit | Attending: Hematology | Admitting: Hematology

## 2014-06-07 DIAGNOSIS — D649 Anemia, unspecified: Secondary | ICD-10-CM | POA: Insufficient documentation

## 2014-06-07 DIAGNOSIS — D61818 Other pancytopenia: Secondary | ICD-10-CM | POA: Insufficient documentation

## 2014-06-07 LAB — PROTEIN ELECTROPHORESIS, SERUM, WITH REFLEX
ALPHA-1-GLOBULIN: 4.8 % (ref 2.9–4.9)
Albumin ELP: 64.6 % (ref 55.8–66.1)
Alpha-2-Globulin: 8.2 % (ref 7.1–11.8)
BETA 2: 4.7 % (ref 3.2–6.5)
Beta Globulin: 6.2 % (ref 4.7–7.2)
GAMMA GLOBULIN: 11.5 % (ref 11.1–18.8)
Total Protein, Serum Electrophoresis: 6.4 g/dL (ref 6.0–8.3)

## 2014-06-07 LAB — METHYLMALONIC ACID, SERUM: METHYLMALONIC ACID, QUANT: 143 nmol/L (ref 87–318)

## 2014-06-07 LAB — FOLATE RBC: RBC Folate: 893 ng/mL (ref >280)

## 2014-06-11 LAB — TECHNOLOGIST REVIEW

## 2014-06-14 ENCOUNTER — Other Ambulatory Visit: Payer: Self-pay | Admitting: Hematology

## 2014-06-14 DIAGNOSIS — D61818 Other pancytopenia: Secondary | ICD-10-CM

## 2014-06-17 ENCOUNTER — Other Ambulatory Visit: Payer: Self-pay | Admitting: Hematology

## 2014-06-17 DIAGNOSIS — D61818 Other pancytopenia: Secondary | ICD-10-CM

## 2014-06-18 ENCOUNTER — Telehealth: Payer: Self-pay | Admitting: Hematology

## 2014-06-18 NOTE — Telephone Encounter (Signed)
per 11/23 pof moved appt to 1-2 wks after bx. bx on schedule for 12/10. feb appt cxd and moved to 12/18 - date due to YF out wk of 12/22. s/w pt husband he is aware. also confimed w/husband that he has been contacted re bx appt.

## 2014-06-28 ENCOUNTER — Other Ambulatory Visit: Payer: Self-pay | Admitting: Hematology

## 2014-06-29 ENCOUNTER — Other Ambulatory Visit: Payer: Self-pay | Admitting: Radiology

## 2014-06-30 ENCOUNTER — Encounter (HOSPITAL_COMMUNITY): Payer: Self-pay

## 2014-06-30 ENCOUNTER — Ambulatory Visit (HOSPITAL_COMMUNITY)
Admission: RE | Admit: 2014-06-30 | Discharge: 2014-06-30 | Disposition: A | Discharge status: Home / Self Care | Payer: Medicare Other | Source: Ambulatory Visit | Attending: Hematology | Admitting: Hematology

## 2014-06-30 DIAGNOSIS — Z7901 Long term (current) use of anticoagulants: Secondary | ICD-10-CM | POA: Insufficient documentation

## 2014-06-30 DIAGNOSIS — D61818 Other pancytopenia: Secondary | ICD-10-CM | POA: Diagnosis present

## 2014-06-30 HISTORY — DX: Cerebral infarction, unspecified: I63.9

## 2014-06-30 LAB — CBC
HEMATOCRIT: 29.1 % — AB (ref 36.0–46.0)
Hemoglobin: 9.6 g/dL — ABNORMAL LOW (ref 12.0–15.0)
MCH: 28.7 pg (ref 26.0–34.0)
MCHC: 33 g/dL (ref 30.0–36.0)
MCV: 87.1 fL (ref 78.0–100.0)
Platelets: 117 10*3/uL — ABNORMAL LOW (ref 150–400)
RBC: 3.34 MIL/uL — ABNORMAL LOW (ref 3.87–5.11)
RDW: 13.4 % (ref 11.5–15.5)
WBC: 1.6 10*3/uL — ABNORMAL LOW (ref 4.0–10.5)

## 2014-06-30 LAB — PROTIME-INR
INR: 0.98 (ref 0.00–1.49)
PROTHROMBIN TIME: 13.1 s (ref 11.6–15.2)

## 2014-06-30 LAB — BONE MARROW EXAM

## 2014-06-30 LAB — APTT: aPTT: 27 seconds (ref 24–37)

## 2014-06-30 MED ORDER — SODIUM CHLORIDE 0.9 % IV SOLN
INTRAVENOUS | Status: DC
  Administered 2014-06-30: 20 mL/h via INTRAVENOUS

## 2014-06-30 MED ORDER — FENTANYL CITRATE 0.05 MG/ML IJ SOLN
INTRAMUSCULAR | Status: AC | PRN
  Administered 2014-06-30: 25 ug via INTRAVENOUS

## 2014-06-30 MED ORDER — FENTANYL CITRATE 0.05 MG/ML IJ SOLN
INTRAMUSCULAR | Status: AC
  Filled 2014-06-30: qty 4

## 2014-06-30 MED ORDER — HYDROCODONE-ACETAMINOPHEN 5-325 MG PO TABS
1.0000 | ORAL_TABLET | ORAL | Status: DC | PRN
  Filled 2014-06-30: qty 2

## 2014-06-30 MED ORDER — MIDAZOLAM HCL 2 MG/2ML IJ SOLN
INTRAMUSCULAR | Status: AC
  Filled 2014-06-30: qty 4

## 2014-06-30 MED ORDER — MIDAZOLAM HCL 2 MG/2ML IJ SOLN
INTRAMUSCULAR | Status: AC | PRN
  Administered 2014-06-30: 1 mg via INTRAVENOUS

## 2014-06-30 NOTE — Discharge Instructions (Signed)
Bone Biopsy, Needle, Care After Read the instructions outlined below and refer to this sheet in the next few weeks. These discharge instructions provide you with general information on caring for yourself after you leave the hospital. Your caregiver may also give you specific instructions. While your treatment has been planned according to the most current medical practices available, unavoidable complications sometimes occur. If you have any problems or questions after discharge, call your caregiver. Finding out the results of your test Not all test results are available during your visit. If your test results are not back during the visit, make an appointment with your caregiver to find out the results. Do not assume everything is normal if you have not heard from your caregiver or the medical facility. It is important for you to follow up on all of your test results.  May bath tomorrow and remove dressing. Keep dressing dry today SEEK MEDICAL CARE IF:   You have redness, swelling, or increasing pain at the site of the biopsy.  You have pus coming from the biopsy site.  You have drainage from the biopsy site lasting longer than 1 day.  You notice a bad smell coming from the biopsy site or dressing.  You develop persistent nausea or vomiting. SEEK IMMEDIATE MEDICAL CARE IF:  You have a fever.  You develop a rash.  You have difficulty breathing.  You develop any reaction or side effects to medicines given. Document Released: 01/26/2005 Document Revised: 04/29/2013 Document Reviewed: 12/14/2008 [facility] Patient Information 2015 [place], [place]. This information is not intended to replace advice given to you by your health care provider. Make sure you discuss any questions you have with your health care provider. Conscious Sedation, Adult, Care After Refer to this sheet in the next few weeks. These instructions provide you with information on caring for yourself after your procedure. Your  health care provider may also give you more specific instructions. Your treatment has been planned according to current medical practices, but problems sometimes occur. Call your health care provider if you have any problems or questions after your procedure. WHAT TO EXPECT AFTER THE PROCEDURE  After your procedure:  You may feel sleepy, clumsy, and have poor balance for several hours.  Vomiting may occur if you eat too soon after the procedure. HOME CARE INSTRUCTIONS  Do not participate in any activities where you could become injured for at least 24 hours. Do not:  Drive.  Swim.  Ride a bicycle.  Operate heavy machinery.  Cook.  Use power tools.  Climb ladders.  Work from a high place.  Do not make important decisions or sign legal documents until you are improved.  If you vomit, drink water, juice, or soup when you can drink without vomiting. Make sure you have little or no nausea before eating solid foods.  Only take over-the-counter or prescription medicines for pain, discomfort, or fever as directed by your health care provider.  Make sure you and your family fully understand everything about the medicines given to you, including what side effects may occur.  You should not drink alcohol, take sleeping pills, or take medicines that cause drowsiness for at least 24 hours.  If you smoke, do not smoke without supervision.  If you are feeling better, you may resume normal activities 24 hours after you were sedated.  Keep all appointments with your health care provider. SEEK MEDICAL CARE IF:  Your skin is pale or bluish in color.  You continue to feel nauseous or  vomit.  Your pain is getting worse and is not helped by medicine.  You have bleeding or swelling.  You are still sleepy or feeling clumsy after 24 hours. SEEK IMMEDIATE MEDICAL CARE IF:  You develop a rash.  You have difficulty breathing.  You develop any type of allergic problem.  You have a  fever. MAKE SURE YOU:  Understand these instructions.  Will watch your condition.  Will get help right away if you are not doing well or get worse. Document Released: 04/29/2013 Document Reviewed: 04/29/2013 [facility] Patient Information 2015 [place], [place]. This information is not intended to replace advice given to you by your health care provider. Make sure you discuss any questions you have with your health care provider.

## 2014-06-30 NOTE — H&P (Signed)
Chief Complaint: "I am here for a bone marrow biopsy."  Referring Physician(s): Feng,Yan  History of Present Illness: Sharon [name] is a 68 y.o. female with pancytopenia who has been seen by Dr. Burr Medico and scheduled today for an image guided bone marrow biopsy. She denies any chest pain, shortness of breath or palpitations. She denies any active signs of bleeding or excessive bruising. She denies any recent fever or chills. The patient denies any history of sleep apnea or chronic oxygen use. She has previously tolerated sedation without complications.    Past Medical History  Diagnosis Date  . Bipolar 1 disorder   . Dementia   . Other pancytopenia 06/03/2014  . Stroke     Past Surgical History  Procedure Laterality Date  . Cholecystectomy      Allergies: Review of patient's allergies indicates no known allergies.  Medications: Prior to Admission medications   Medication Sig Start Date End Date Taking? Authorizing Provider  acetaminophen (TYLENOL) 500 MG tablet Take 500 mg by mouth at bedtime as needed for moderate pain.   Yes Historical Provider, MD  aspirin EC 81 MG tablet Take 81 mg by mouth at bedtime.   Yes Historical Provider, MD  Cholecalciferol (VITAMIN D3) 1000 UNITS CAPS Take 1 capsule by mouth at bedtime as needed.    Yes Historical Provider, MD  citalopram (CELEXA) 40 MG tablet Take 20 mg by mouth at bedtime.  05/04/14  Yes Historical Provider, MD  Fish Oil-Cholecalciferol (FISH OIL + D3) 1200-1000 MG-UNIT CAPS Take 1,200 mg by mouth at bedtime.    Yes Historical Provider, MD  QUEtiapine (SEROQUEL) 300 MG tablet Take 300 mg by mouth 2 (two) times daily.  03/22/14  Yes Historical Provider, MD  tamsulosin (FLOMAX) 0.4 MG CAPS capsule Take 0.4 mg by mouth at bedtime.  03/12/14  Yes Historical Provider, MD  trospium (SANCTURA) 20 MG tablet Take 20 mg by mouth 2 (two) times daily.  05/17/14  Yes Historical Provider, MD  vitamin B-12 (CYANOCOBALAMIN) 500 MCG tablet Take  1,000 mcg by mouth at bedtime.   Yes Historical Provider, MD  FLUZONE HIGH-DOSE 0.5 ML SUSY Inject 1 each into the skin once.  04/08/14   Historical Provider, MD    Family History  Problem Relation Age of Onset  . Cancer Mother   . Heart attack Father     History   Social History  . Marital Status: Married    Spouse Name: N/A    Number of Children: N/A  . Years of Education: N/A   Social History Main Topics  . Smoking status: Never Smoker   . Smokeless tobacco: Never Used  . Alcohol Use: No  . Drug Use: No  . Sexual Activity: None   Other Topics Concern  . None   Social History Narrative    Review of Systems: A 12 point ROS discussed and pertinent positives are indicated in the HPI above.  All other systems are negative.  Review of Systems  Vital Signs: BP 102/73 mmHg  Pulse 70  Temp(Src) 97.7 F (36.5 C) (Oral)  Resp 16  SpO2 100%  Physical Exam  Constitutional: She is oriented to person, place, and time. No distress.  HENT:  Head: Normocephalic and atraumatic.  Neck: No tracheal deviation present.  Cardiovascular: Normal rate and regular rhythm.  Exam reveals no gallop and no friction rub.   No murmur heard. Pulmonary/Chest: Effort normal and breath sounds normal. No respiratory distress. She has no wheezes. She has  no rales.  Abdominal: Soft. Bowel sounds are normal. She exhibits no distension. There is no tenderness.  Neurological: She is alert and oriented to person, place, and time.  Skin: She is not diaphoretic.  Psychiatric: She has a normal mood and affect. Her behavior is normal.    Imaging: US Abdomen Complete  06/07/2014   CLINICAL DATA:  Anemia, pancytopenia.  EXAM: ULTRASOUND ABDOMEN COMPLETE  COMPARISON:  None.  FINDINGS: Gallbladder: Surgically absent.  Common bile duct: Diameter: 5 mm, within normal limits.  Liver: No focal lesion identified. Within normal limits in parenchymal echogenicity.  IVC: No abnormality visualized.  Pancreas:  Visualized portion unremarkable.  Spleen: 13.1 cm (volume 654 cubic cm).  Right Kidney: Length: 9.4 cm. Parenchymal echogenicity is within normal limits. No hydronephrosis. A 0.8 x 1.5 x 1.0 cm anechoic lesion with increased through transmission is consistent with a cyst.  Left Kidney: Length: 10.0 cm. Parenchymal echogenicity is within normal limits. No hydronephrosis. No focal lesion.  Abdominal aorta: No aneurysm visualized.  Other findings: None.  IMPRESSION: 1. No acute findings. 2. Splenomegaly.   Electronically Signed   By: Lorin Picket M.D.   On: 06/07/2014 14:26   Labs:  CBC:  Recent Labs  06/03/14 1429 06/30/14 1040  WBC 1.6* 1.6*  HGB 9.9* 9.6*  HCT 30.2* 29.1*  PLT 106* 117*    COAGS:  Recent Labs  06/30/14 1040  INR 0.98  APTT 27    Assessment and Plan: Pancytopenia Scheduled for image guided bone marrow biopsy with moderate sedation Patient has been NPO, labs reviewed Risks and Benefits discussed with the patient. All of the patient's questions were answered, patient is agreeable to proceed. Consent signed and in chart.     SignedHedy Jacob 06/30/2014, 1:12 PM

## 2014-06-30 NOTE — Procedures (Signed)
Interventional Radiology Procedure Note  Procedure: CT guided aspirate and core biopsy of right iliac bone Complications: None Recommendations: - Bedrest supine x 2 hrs - Hydrocodone PRN  Pain - Follow biopsy results  Signed,  Natalio Salois K. Ilze Roselli, MD   

## 2014-07-01 ENCOUNTER — Other Ambulatory Visit (HOSPITAL_COMMUNITY): Payer: Medicare Other

## 2014-07-01 ENCOUNTER — Ambulatory Visit (HOSPITAL_COMMUNITY): Admission: RE | Admit: 2014-07-01 | Payer: Medicare Other | Source: Ambulatory Visit

## 2014-07-08 ENCOUNTER — Other Ambulatory Visit: Payer: Self-pay | Admitting: *Deleted

## 2014-07-08 DIAGNOSIS — D61818 Other pancytopenia: Secondary | ICD-10-CM

## 2014-07-09 ENCOUNTER — Ambulatory Visit (HOSPITAL_BASED_OUTPATIENT_CLINIC_OR_DEPARTMENT_OTHER): Payer: Medicare Other | Admitting: Hematology

## 2014-07-09 ENCOUNTER — Other Ambulatory Visit (HOSPITAL_BASED_OUTPATIENT_CLINIC_OR_DEPARTMENT_OTHER): Payer: Medicare Other

## 2014-07-09 ENCOUNTER — Telehealth: Payer: Self-pay | Admitting: Hematology

## 2014-07-09 VITALS — BP 113/56 | HR 78 | Resp 18 | Ht 64.0 in | Wt 108.4 lb

## 2014-07-09 DIAGNOSIS — D61818 Other pancytopenia: Secondary | ICD-10-CM

## 2014-07-09 DIAGNOSIS — F319 Bipolar disorder, unspecified: Secondary | ICD-10-CM

## 2014-07-09 LAB — CBC WITH DIFFERENTIAL/PLATELET
BASO%: 0.1 % (ref 0.0–2.0)
Basophils Absolute: 0 10*3/uL (ref 0.0–0.1)
EOS%: 0.3 % (ref 0.0–7.0)
Eosinophils Absolute: 0 10*3/uL (ref 0.0–0.5)
HCT: 32.5 % — ABNORMAL LOW (ref 34.8–46.6)
HGB: 10.3 g/dL — ABNORMAL LOW (ref 11.6–15.9)
LYMPH#: 0.5 10*3/uL — AB (ref 0.9–3.3)
LYMPH%: 23.9 % (ref 14.0–49.7)
MCH: 27.7 pg (ref 25.1–34.0)
MCHC: 31.6 g/dL (ref 31.5–36.0)
MCV: 87.5 fL (ref 79.5–101.0)
MONO#: 0.1 10*3/uL (ref 0.1–0.9)
MONO%: 7 % (ref 0.0–14.0)
NEUT%: 68.7 % (ref 38.4–76.8)
NEUTROS ABS: 1.3 10*3/uL — AB (ref 1.5–6.5)
Platelets: 114 10*3/uL — ABNORMAL LOW (ref 145–400)
RBC: 3.71 10*6/uL (ref 3.70–5.45)
RDW: 14 % (ref 11.2–14.5)
WBC: 2 10*3/uL — ABNORMAL LOW (ref 3.9–10.3)

## 2014-07-09 MED ORDER — FERROUS SULFATE 325 (65 FE) MG PO TBEC: 325.0000 mg | DELAYED_RELEASE_TABLET | Freq: Two times a day (BID) | ORAL | Status: DC

## 2014-07-09 NOTE — Telephone Encounter (Signed)
Gave avs & cal for March. °

## 2014-07-10 ENCOUNTER — Encounter: Payer: Self-pay | Admitting: Hematology

## 2014-07-10 NOTE — Progress Notes (Signed)
[facility] FOLLOW UP NOTE  Patient Care Team: Precious Reel, MD as PCP - General (Internal Medicine)  Dr. Sheralyn Boatman (psychiatrist)   CHIEF COMPLAINS Follow up Pancytopenia   HISTORY OF INITIAL PRESENTING ILLNESS:  Sharon [name] is a  68 y.o. female with past medical history significant for long-standing bipolar and mild pancytopenia, who is being referred by her primary care physician Dr. Harrington Challenger for follow-up her cytopenia. She was previously seen by our hematologist Dr. Ralene Ok with the last visit in 2012.  She has strong family history of bipolar, and was diagnosed with bipolar about 36 years ago after she gave birth to her son. She has been on antipsychotic medication since then. 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 This was switched to another cycle medication including Depakote, which was switched to [facility] about 4-5 years ago.Per Dr. Nolon Lennert note and patient's husband, she was initially noticed to have mild leukopenia and anemia in 1999. At that time she was noticed to have low B12 level, a bone marrow biopsy was done which was unremarkable. She was suggested to take B12 supplement. I do not have all her previous blood counts available, but she did have a normal white blood cell counts in April 2007, and her white counts has been around 1.5-2 since 2008. Her hemoglobin has been around 11-12. She also has mild borderline thrombocytopenia intermittently. She had multiple episodes of urinary tract infection in the past decade, however no other history of severe infections. She has never required blood transfusion. No history of bleeding. She was recently seen by her primary care physician Dr. Harrington Challenger, and hope blood work on 04/20/2014 showed WBC 1.9K/ul ANC 1.0K/ul, lymphocytes 0.8K/ul,  hemoglobin 10.3g/dl HCT 29.5% and MCV 88.6 MCHC 34.9 and platelet count 157K. Complete metabolic panel was unremarkable. Vitamin B12 level was  1245 pg/ml which is above the upper limit of normal range. TSH was normal vitamin D level was normal.  Per her husband, she has been followed up by her psychiatrist. She does have mild episodes of manic, but overall mood is stable. She has had multiple fall and unstable gait lately. This was evaluated by a neurologist, brain MRI showedmild generalized atrophy and white matter disease. 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  INTERIM HISTORY [name] returns for follow up and discuss bone marrow biopsy result. Patient states she feels well overall. She denies any pain, cough, fever, nausea, or change of her bowel habits. She has good appetite and her weight has been stable. No new change since her last visit one month ago.   MEDICAL HISTORY:  Past Medical History  Diagnosis Date  . Bipolar 1 disorder   . Dementia   . Other pancytopenia 06/03/2014  . Stroke     SURGICAL HISTORY: Past Surgical History  Procedure Laterality Date  . Cholecystectomy      SOCIAL HISTORY: History   Social History  . Marital Status: Married    Spouse Name: N/A    Number of Children: N/A  . Years of Education: N/A   Occupational History  . Not on file.   Social History Main Topics  . Smoking status: Never Smoker   . Smokeless tobacco: Never Used  . Alcohol Use: No  . Drug Use: No  . Sexual Activity: Not on file   Other Topics Concern  . Not on file   Social History Narrative    FAMILY HISTORY: Family History  Problem Relation Age of Onset  . Cancer Mother   . Heart attack Father     ALLERGIES:  has No Known Allergies.  MEDICATIONS:  Current Outpatient Prescriptions  Medication Sig Dispense Refill  . acetaminophen (TYLENOL) 500 MG tablet Take 500 mg by mouth at bedtime as needed for moderate pain.    Marland Kitchen aspirin EC 81 MG tablet Take 81 mg by mouth at bedtime.    . Cholecalciferol (VITAMIN D3) 1000 UNITS CAPS Take 1 capsule by mouth at bedtime as needed.     . citalopram  (CELEXA) 40 MG tablet Take 20 mg by mouth at bedtime.   1  . Fish Oil-Cholecalciferol (FISH OIL + D3) 1200-1000 MG-UNIT CAPS Take 1,200 mg by mouth at bedtime.     Marland Kitchen FLUZONE HIGH-DOSE 0.5 ML SUSY Inject 1 each into the skin once.   0  . QUEtiapine (SEROQUEL) 300 MG tablet Take 300 mg by mouth 2 (two) times daily.     . tamsulosin (FLOMAX) 0.4 MG CAPS capsule Take 0.4 mg by mouth at bedtime.   3  . trospium (SANCTURA) 20 MG tablet Take 20 mg by mouth 2 (two) times daily.   11  . vitamin B-12 (CYANOCOBALAMIN) 500 MCG tablet Take 1,000 mcg by mouth at bedtime.    . ferrous sulfate 325 (65 FE) MG EC tablet Take 1 tablet (325 mg total) by mouth 2 (two) times daily after a meal. 60 tablet 3   No current facility-administered medications for this visit.    REVIEW OF SYSTEMS:   Constitutional: Denies fevers, chills or abnormal night sweats Eyes: Denies blurriness of vision, double vision or watery eyes Ears, nose, mouth, throat, and face: Denies mucositis or sore throat Respiratory: Denies cough, dyspnea or wheezes Cardiovascular: Denies palpitation, chest discomfort or lower extremity swelling Gastrointestinal:  Denies nausea, heartburn or change in bowel habits Skin: Denies abnormal skin rashes Lymphatics: Denies new lymphadenopathy or easy bruising Neurological:Denies numbness, tingling or new weaknesses Behavioral/Psych: positive for history of bipolar. Mood is stable, mild manic episodes,no new changes  All other systems were reviewed with the patient and are negative.  PHYSICAL EXAMINATION:   Filed Vitals:   07/09/14 1112  BP: 113/56  Pulse: 78  Resp: 18   Filed Weights   07/09/14 1112  Weight: 108 lb 6.4 oz (49.17 kg)    GENERAL:alert, no distress and comfortable SKIN: appears pale, skin texture, turgor are normal, no rashes or significant lesions EYES: normal, conjunctiva are pink and non-injected, sclera clear OROPHARYNX:no exudate, no erythema and lips, buccal mucosa, and  tongue normal  NECK: supple, thyroid normal size, non-tender, without nodularity LYMPH:  no palpable lymphadenopathy in the cervical, axillary or inguinal LUNGS: clear to auscultation and percussion with normal breathing effort HEART: regular rate & rhythm and no murmurs and no lower extremity edema ABDOMEN:abdomen soft, non-tender and normal bowel sounds Musculoskeletal:no cyanosis of digits and no clubbing  PSYCH: alert & oriented x 3 with fluent speech,  NEURO: no focal motor/sensory deficits, her gait is slightly unsteady.  LABORATORY DATA:  I have reviewed the data as listed in history  CBC Latest Ref Rng 07/09/2014 06/30/2014 06/03/2014  WBC 3.9 - 10.3 10e3/uL 2.0(L) 1.6(L) 1.6(L)  Hemoglobin 11.6 - 15.9 g/dL 10.3(L) 9.6(L) 9.9(L)  Hematocrit 34.8 - 46.6 % 32.5(L) 29.1(L) 30.2(L)  Platelets 145 - 400 10e3/uL 114(L) 117(L) 106(L)    CMP Latest Ref Rng 08/18/2010 08/19/2009 08/19/2008  Glucose 70 - 99 mg/dL 103(H) 81 82  BUN 6 -  23 mg/dL 24(H) 23 14  Creatinine 0.40 - 1.20 mg/dL 0.85 0.77 0.71  Sodium 135 - 145 mEq/L 140 142 144  Potassium 3.5 - 5.3 mEq/L 4.3 4.2 3.9  Chloride 96 - 112 mEq/L 105 106 108  CO2 19 - 32 mEq/L 25 26 24   Calcium 8.4 - 10.5 mg/dL 9.5 9.1 9.0  Total Protein 6.0 - 8.3 g/dL 6.8 6.6 6.6  Total Bilirubin 0.3 - 1.2 mg/dL 0.3 0.4 0.5  Alkaline Phos 39 - 117 U/L 82 105 59  AST 0 - 37 U/L 14 14 23   ALT 0 - 35 U/L 18 29 33    PATHOLOGY REPORT Bone Marrow, Aspirate,Biopsy, and Clot, right iliac - HYPERCELLULAR BONE MARROW FOR AGE WITH TRILINEAGE HEMATOPOIESIS. - SEE COMMENT. PERIPHERAL BLOOD: - PANCYTOPENIA. Diagnosis Note The bone marrow is hypercellular for age with trilineage hematopoiesis and nonspecific changes. Significant dyspoiesis is not present but correlation with cytogenetic and FISH studies is recommended. (BNS:ecj 07-19-2014) Susanne Greenhouse MD Pathologist, Electronic Signature (Case signed 07/02/2014) GROSS AND MICROSCOPIC  INFORMATION Specimen Clinical Information Pancytopenia (je) Source Bone Marrow, Aspirate,Biopsy, and Clot, right iliac Microscopic LAB DATA: CBC performed on 06/30/2014 shows: WBC 1.6 K/ul Neutrophils 57% HB 9.6 g/dl Lymphocytes 32% HCT 29.1 % Monocytes 10% MCV 87.1 fL Eosinophils 1% RDW 13.4 % Basophils 0% PLT 117 K/ul 1 of 3 FINAL for Herford, Jennene L ([id number]) Microscopic(continued) PERIPHERAL BLOOD SMEAR: The red blood cells display mild anisopoikilocytosis with mild polychromasia. The white blood cells are decreased in number. The lymphocytic cells mostly display reactive features. No neutrophilic left shift or blasts seen on scan. The platelets are decreased in number. BONE MARROW ASPIRATE: Erythroid precursors: Orderly and progressive maturation. Granulocytic precursors: Orderly and progressive maturation for the most part. Scattered maturing cells are hypolobated. Megakaryocytes: Abundant with predominantly normal morphology. Lymphocytes/plasma cells: Large aggregates not present. TOUCH PREPARATIONS: A mixture of myeloid cell types present. CLOT and BIOPSY: The sections show 40 to 60% cellularity with a mixture of myeloid cell types. An occasional small interstitial and well circumscribed lymphoid aggregate composed of small lymphocytes is seen. IRON STAIN: Iron stains are performed on a bone marrow aspirate smear and section of clot. The controls stained appropriately. Storage Iron: Present. Ringed Sideroblasts: Absent. ADDITIONAL DATA / TESTING: The specimen was sent for cytogenetic analysis FISH for MDS and a separate report will follow. Specimen Table Bone Marrow count performed on 500 cells shows: Blasts: 2% Myeloid 60% Promyelocyts: 1% Myelocytes: 12% Erythroid 28% Metamyelocyts: 3% Bands: 15% Lymphocytes: 7% Neutrophils: 19% Eosinophils: 8% Plasma Cells: 3% Basophils: 0% Monocytes: 2% M:E ratio: 2.1     RADIOGRAPHIC STUDIES: BRAIN MRI  04/21/14 IMPRESSION: 1. No acute or focal lesion to explain the patient's symptoms. 2. Mild generalized atrophy and white matter disease is somewhat advanced. This likely reflects the sequela of chronic microvascular ischemia. 3. 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  ASSESSMENT & PLAN:  1. PANCYTOPENIA ( Leukopenia, mild thrombocytopenia  and normocytic anemia) -I reviewed her bone marrow aspiration and biopsy result. She has mild hypercellular marrow (40-60%), but no significant dyspoiesis. The morphology is unremarkable. Her  MDS fish and cytogenetics results are still pending.  This is a long-standing chronic issue. Her leukopenia (neutropenia and lymphopenia) has been stable and mild overall. This has not been changed over the past 16 years. She has not had severe infection episodes except some UTI.  -She has low iron and saturation , ferritin 31, which supports iron defficent anemia. I suggest her  take oral iron twice daily. Prescription was given today.  -her overall cytopenia is probably related to her psychiatry medication. Depakote and Seroquel can certainly cause cytopenia. Given her long-standing history of mild pancytopenia, this appears to be a benign process. I do not think she need GCSF for now.   2. Bipolar -She will continue follow up with her psychiatrist [name].   I plan to see her back in 3 months, with a repeat CBC and ferritin level.      Orders Placed This Encounter  Procedures  . CBC & Diff and Retic    Standing Status: Standing     Number of Occurrences: 10     Standing Expiration Date: 07/09/2016  . Comprehensive metabolic panel (Cmet) - [facility]    Standing Status: Standing     Number of Occurrences: 10     Standing Expiration Date: 07/09/2016  . Erythropoietin    Standing Status: Future     Number of Occurrences:      Standing Expiration Date: 07/10/2015    All questions were answered. The patient knows to call the clinic with any  problems, questions or concerns. I spent 20 minutes counseling the patient face to face. The total time spent in the appointment was 30 minutes and more than 50% was on counseling.     Truitt Merle, MD 07/09/2014 4:00 PM

## 2014-07-12 LAB — TISSUE HYBRIDIZATION (BONE MARROW)-NCBH

## 2014-07-12 LAB — CHROMOSOME ANALYSIS, BONE MARROW

## 2014-07-22 ENCOUNTER — Encounter (HOSPITAL_COMMUNITY): Payer: Self-pay

## 2014-08-13 ENCOUNTER — Telehealth: Payer: Self-pay | Admitting: Hematology

## 2014-08-13 NOTE — Telephone Encounter (Signed)
Confirm appointment rescheduled from 3/23 to 3/24

## 2014-09-02 ENCOUNTER — Other Ambulatory Visit: Payer: Medicare Other

## 2014-09-02 ENCOUNTER — Ambulatory Visit: Payer: Medicare Other | Admitting: Hematology

## 2014-10-13 ENCOUNTER — Ambulatory Visit: Payer: Medicare Other | Admitting: Hematology

## 2014-10-13 ENCOUNTER — Other Ambulatory Visit: Payer: Medicare Other

## 2014-10-14 ENCOUNTER — Inpatient Hospital Stay (HOSPITAL_COMMUNITY)
Admission: EM | Admit: 2014-10-14 | Discharge: 2014-10-25 | DRG: 085 | Disposition: A | Discharge status: Rehabilitation Facility | Payer: Medicare Other | Attending: Internal Medicine | Admitting: Internal Medicine

## 2014-10-14 ENCOUNTER — Emergency Department (HOSPITAL_COMMUNITY): Payer: Medicare Other

## 2014-10-14 ENCOUNTER — Encounter (HOSPITAL_COMMUNITY): Payer: Self-pay | Admitting: *Deleted

## 2014-10-14 ENCOUNTER — Other Ambulatory Visit: Payer: Medicare Other

## 2014-10-14 ENCOUNTER — Ambulatory Visit: Payer: Medicare Other | Admitting: Hematology

## 2014-10-14 DIAGNOSIS — R739 Hyperglycemia, unspecified: Secondary | ICD-10-CM | POA: Diagnosis present

## 2014-10-14 DIAGNOSIS — I5022 Chronic systolic (congestive) heart failure: Secondary | ICD-10-CM | POA: Diagnosis present

## 2014-10-14 DIAGNOSIS — R569 Unspecified convulsions: Secondary | ICD-10-CM | POA: Diagnosis present

## 2014-10-14 DIAGNOSIS — S06369D Traumatic hemorrhage of cerebrum, unspecified, with loss of consciousness of unspecified duration, subsequent encounter: Secondary | ICD-10-CM | POA: Diagnosis not present

## 2014-10-14 DIAGNOSIS — S065X1A Traumatic subdural hemorrhage with loss of consciousness of 30 minutes or less, initial encounter: Secondary | ICD-10-CM | POA: Diagnosis present

## 2014-10-14 DIAGNOSIS — D638 Anemia in other chronic diseases classified elsewhere: Secondary | ICD-10-CM | POA: Diagnosis present

## 2014-10-14 DIAGNOSIS — S06321A Contusion and laceration of left cerebrum with loss of consciousness of 30 minutes or less, initial encounter: Secondary | ICD-10-CM | POA: Diagnosis not present

## 2014-10-14 DIAGNOSIS — E876 Hypokalemia: Secondary | ICD-10-CM | POA: Diagnosis not present

## 2014-10-14 DIAGNOSIS — S066X1A Traumatic subarachnoid hemorrhage with loss of consciousness of 30 minutes or less, initial encounter: Secondary | ICD-10-CM | POA: Diagnosis present

## 2014-10-14 DIAGNOSIS — J810 Acute pulmonary edema: Secondary | ICD-10-CM | POA: Diagnosis not present

## 2014-10-14 DIAGNOSIS — B9620 Unspecified Escherichia coli [E. coli] as the cause of diseases classified elsewhere: Secondary | ICD-10-CM | POA: Diagnosis not present

## 2014-10-14 DIAGNOSIS — I493 Ventricular premature depolarization: Secondary | ICD-10-CM | POA: Diagnosis not present

## 2014-10-14 DIAGNOSIS — Z79899 Other long term (current) drug therapy: Secondary | ICD-10-CM

## 2014-10-14 DIAGNOSIS — R471 Dysarthria and anarthria: Secondary | ICD-10-CM | POA: Diagnosis present

## 2014-10-14 DIAGNOSIS — S069X9A Unspecified intracranial injury with loss of consciousness of unspecified duration, initial encounter: Secondary | ICD-10-CM | POA: Diagnosis not present

## 2014-10-14 DIAGNOSIS — R0902 Hypoxemia: Secondary | ICD-10-CM | POA: Diagnosis not present

## 2014-10-14 DIAGNOSIS — Z7982 Long term (current) use of aspirin: Secondary | ICD-10-CM

## 2014-10-14 DIAGNOSIS — G9340 Encephalopathy, unspecified: Secondary | ICD-10-CM | POA: Diagnosis not present

## 2014-10-14 DIAGNOSIS — R561 Post traumatic seizures: Secondary | ICD-10-CM | POA: Diagnosis present

## 2014-10-14 DIAGNOSIS — S0633AA Contusion and laceration of cerebrum, unspecified, with loss of consciousness status unknown, initial encounter: Secondary | ICD-10-CM | POA: Insufficient documentation

## 2014-10-14 DIAGNOSIS — N390 Urinary tract infection, site not specified: Secondary | ICD-10-CM | POA: Diagnosis not present

## 2014-10-14 DIAGNOSIS — S06370D Contusion, laceration, and hemorrhage of cerebellum without loss of consciousness, subsequent encounter: Secondary | ICD-10-CM | POA: Diagnosis not present

## 2014-10-14 DIAGNOSIS — Z8249 Family history of ischemic heart disease and other diseases of the circulatory system: Secondary | ICD-10-CM | POA: Diagnosis not present

## 2014-10-14 DIAGNOSIS — W109XXA Fall (on) (from) unspecified stairs and steps, initial encounter: Secondary | ICD-10-CM | POA: Diagnosis present

## 2014-10-14 DIAGNOSIS — J811 Chronic pulmonary edema: Secondary | ICD-10-CM

## 2014-10-14 DIAGNOSIS — F22 Delusional disorders: Secondary | ICD-10-CM

## 2014-10-14 DIAGNOSIS — S0291XA Unspecified fracture of skull, initial encounter for closed fracture: Secondary | ICD-10-CM | POA: Diagnosis not present

## 2014-10-14 DIAGNOSIS — Z66 Do not resuscitate: Secondary | ICD-10-CM | POA: Diagnosis not present

## 2014-10-14 DIAGNOSIS — Z0189 Encounter for other specified special examinations: Secondary | ICD-10-CM

## 2014-10-14 DIAGNOSIS — I629 Nontraumatic intracranial hemorrhage, unspecified: Secondary | ICD-10-CM

## 2014-10-14 DIAGNOSIS — S06314S Contusion and laceration of right cerebrum with loss of consciousness of 6 hours to 24 hours, sequela: Secondary | ICD-10-CM | POA: Diagnosis not present

## 2014-10-14 DIAGNOSIS — J9600 Acute respiratory failure, unspecified whether with hypoxia or hypercapnia: Secondary | ICD-10-CM | POA: Diagnosis not present

## 2014-10-14 DIAGNOSIS — R1310 Dysphagia, unspecified: Secondary | ICD-10-CM | POA: Diagnosis present

## 2014-10-14 DIAGNOSIS — Z8673 Personal history of transient ischemic attack (TIA), and cerebral infarction without residual deficits: Secondary | ICD-10-CM

## 2014-10-14 DIAGNOSIS — S06324S Contusion and laceration of left cerebrum with loss of consciousness of 6 hours to 24 hours, sequela: Secondary | ICD-10-CM | POA: Diagnosis not present

## 2014-10-14 DIAGNOSIS — I619 Nontraumatic intracerebral hemorrhage, unspecified: Secondary | ICD-10-CM | POA: Diagnosis present

## 2014-10-14 DIAGNOSIS — J9601 Acute respiratory failure with hypoxia: Secondary | ICD-10-CM | POA: Diagnosis not present

## 2014-10-14 DIAGNOSIS — D649 Anemia, unspecified: Secondary | ICD-10-CM

## 2014-10-14 DIAGNOSIS — F319 Bipolar disorder, unspecified: Secondary | ICD-10-CM | POA: Diagnosis present

## 2014-10-14 DIAGNOSIS — S062X1A Diffuse traumatic brain injury with loss of consciousness of 30 minutes or less, initial encounter: Secondary | ICD-10-CM | POA: Diagnosis present

## 2014-10-14 DIAGNOSIS — F0390 Unspecified dementia without behavioral disturbance: Secondary | ICD-10-CM | POA: Diagnosis present

## 2014-10-14 DIAGNOSIS — S02119A Unspecified fracture of occiput, initial encounter for closed fracture: Secondary | ICD-10-CM | POA: Diagnosis present

## 2014-10-14 DIAGNOSIS — S06339A Contusion and laceration of cerebrum, unspecified, with loss of consciousness of unspecified duration, initial encounter: Secondary | ICD-10-CM | POA: Insufficient documentation

## 2014-10-14 LAB — URINALYSIS, ROUTINE W REFLEX MICROSCOPIC
Bilirubin Urine: NEGATIVE
GLUCOSE, UA: NEGATIVE mg/dL
Hgb urine dipstick: NEGATIVE
Ketones, ur: NEGATIVE mg/dL
Nitrite: NEGATIVE
Protein, ur: NEGATIVE mg/dL
SPECIFIC GRAVITY, URINE: 1.017 (ref 1.005–1.030)
Urobilinogen, UA: 0.2 mg/dL (ref 0.0–1.0)
pH: 6 (ref 5.0–8.0)

## 2014-10-14 LAB — COMPREHENSIVE METABOLIC PANEL
ALT: 23 U/L (ref 0–35)
ANION GAP: 10 (ref 5–15)
AST: 28 U/L (ref 0–37)
Albumin: 3.7 g/dL (ref 3.5–5.2)
Alkaline Phosphatase: 78 U/L (ref 39–117)
BILIRUBIN TOTAL: 0.6 mg/dL (ref 0.3–1.2)
BUN: 32 mg/dL — AB (ref 6–23)
CALCIUM: 9 mg/dL (ref 8.4–10.5)
CO2: 22 mmol/L (ref 19–32)
Chloride: 106 mmol/L (ref 96–112)
Creatinine, Ser: 0.86 mg/dL (ref 0.50–1.10)
GFR calc non Af Amer: 67 mL/min — ABNORMAL LOW (ref >90)
GFR, EST AFRICAN AMERICAN: 78 mL/min — AB (ref >90)
GLUCOSE: 161 mg/dL — AB (ref 70–99)
Potassium: 3.8 mmol/L (ref 3.5–5.1)
SODIUM: 138 mmol/L (ref 135–145)
TOTAL PROTEIN: 6.2 g/dL (ref 6.0–8.3)

## 2014-10-14 LAB — GLUCOSE, CAPILLARY: GLUCOSE-CAPILLARY: 169 mg/dL — AB (ref 70–99)

## 2014-10-14 LAB — CBC WITH DIFFERENTIAL/PLATELET
Basophils Absolute: 0 10*3/uL (ref 0.0–0.1)
Basophils Relative: 0 % (ref 0–1)
EOS ABS: 0 10*3/uL (ref 0.0–0.7)
EOS PCT: 0 % (ref 0–5)
HCT: 34 % — ABNORMAL LOW (ref 36.0–46.0)
Hemoglobin: 11.3 g/dL — ABNORMAL LOW (ref 12.0–15.0)
LYMPHS ABS: 0.7 10*3/uL (ref 0.7–4.0)
Lymphocytes Relative: 10 % — ABNORMAL LOW (ref 12–46)
MCH: 29.1 pg (ref 26.0–34.0)
MCHC: 33.2 g/dL (ref 30.0–36.0)
MCV: 87.6 fL (ref 78.0–100.0)
Monocytes Absolute: 0.5 10*3/uL (ref 0.1–1.0)
Monocytes Relative: 6 % (ref 3–12)
Neutro Abs: 6.3 10*3/uL (ref 1.7–7.7)
Neutrophils Relative %: 84 % — ABNORMAL HIGH (ref 43–77)
PLATELETS: 136 10*3/uL — AB (ref 150–400)
RBC: 3.88 MIL/uL (ref 3.87–5.11)
RDW: 14 % (ref 11.5–15.5)
WBC: 7.6 10*3/uL (ref 4.0–10.5)

## 2014-10-14 LAB — URINE MICROSCOPIC-ADD ON

## 2014-10-14 LAB — PROTIME-INR
INR: 0.99 (ref 0.00–1.49)
Prothrombin Time: 13.2 seconds (ref 11.6–15.2)

## 2014-10-14 LAB — RAPID URINE DRUG SCREEN, HOSP PERFORMED
AMPHETAMINES: NOT DETECTED
BENZODIAZEPINES: POSITIVE — AB
Barbiturates: NOT DETECTED
COCAINE: NOT DETECTED
OPIATES: NOT DETECTED
TETRAHYDROCANNABINOL: NOT DETECTED

## 2014-10-14 LAB — TYPE AND SCREEN
ABO/RH(D): O POS
Antibody Screen: NEGATIVE

## 2014-10-14 LAB — MRSA PCR SCREENING: MRSA BY PCR: NEGATIVE

## 2014-10-14 LAB — ABO/RH: ABO/RH(D): O POS

## 2014-10-14 LAB — TROPONIN I: Troponin I: 0.06 ng/mL — ABNORMAL HIGH (ref <0.031)

## 2014-10-14 MED ORDER — DEXTROSE 5 % IV SOLN: 500.0000 mg | Freq: Once | INTRAVENOUS | Status: DC

## 2014-10-14 MED ORDER — SODIUM CHLORIDE 0.9 % IV SOLN
INTRAVENOUS | Status: DC
  Administered 2014-10-14 – 2014-10-20 (×5): via INTRAVENOUS
  Filled 2014-10-14: qty 1000

## 2014-10-14 MED ORDER — LEVETIRACETAM IN NACL 500 MG/100ML IV SOLN
500.0000 mg | Freq: Two times a day (BID) | INTRAVENOUS | Status: DC
  Administered 2014-10-14 – 2014-10-19 (×11): 500 mg via INTRAVENOUS
  Filled 2014-10-14 (×15): qty 100

## 2014-10-14 MED ORDER — TAMSULOSIN HCL 0.4 MG PO CAPS
0.4000 mg | ORAL_CAPSULE | Freq: Every day | ORAL | Status: DC
  Administered 2014-10-16 – 2014-10-25 (×9): 0.4 mg via ORAL
  Filled 2014-10-14 (×14): qty 1

## 2014-10-14 MED ORDER — LEVETIRACETAM IN NACL 1500 MG/100ML IV SOLN
1500.0000 mg | Freq: Once | INTRAVENOUS | Status: AC
  Administered 2014-10-14: 1500 mg via INTRAVENOUS
  Filled 2014-10-14: qty 100

## 2014-10-14 MED ORDER — FERROUS SULFATE 325 (65 FE) MG PO TABS
325.0000 mg | ORAL_TABLET | Freq: Two times a day (BID) | ORAL | Status: DC
  Administered 2014-10-17 – 2014-10-20 (×5): 325 mg via ORAL
  Filled 2014-10-14 (×14): qty 1

## 2014-10-14 MED ORDER — CETYLPYRIDINIUM CHLORIDE 0.05 % MT LIQD
7.0000 mL | Freq: Two times a day (BID) | OROMUCOSAL | Status: DC
  Administered 2014-10-15 – 2014-10-25 (×19): 7 mL via OROMUCOSAL

## 2014-10-14 MED ORDER — LEVETIRACETAM 500 MG PO TABS
500.0000 mg | ORAL_TABLET | Freq: Two times a day (BID) | ORAL | Status: DC
Start: 2014-10-14 — End: 2014-10-14
  Filled 2014-10-14 (×2): qty 1

## 2014-10-14 MED ORDER — CYANOCOBALAMIN 500 MCG PO TABS
1000.0000 ug | ORAL_TABLET | Freq: Every day | ORAL | Status: DC
  Administered 2014-10-16 – 2014-10-25 (×9): 1000 ug via ORAL
  Filled 2014-10-14 (×14): qty 2

## 2014-10-14 MED ORDER — DARIFENACIN HYDROBROMIDE ER 7.5 MG PO TB24
7.5000 mg | ORAL_TABLET | Freq: Every day | ORAL | Status: DC
  Administered 2014-10-17 – 2014-10-25 (×9): 7.5 mg via ORAL
  Filled 2014-10-14 (×11): qty 1

## 2014-10-14 MED ORDER — QUETIAPINE FUMARATE 50 MG PO TABS
300.0000 mg | ORAL_TABLET | Freq: Two times a day (BID) | ORAL | Status: DC
  Administered 2014-10-16 – 2014-10-25 (×17): 300 mg via ORAL
  Filled 2014-10-14 (×4): qty 1
  Filled 2014-10-14: qty 2
  Filled 2014-10-14 (×2): qty 1
  Filled 2014-10-14 (×3): qty 2
  Filled 2014-10-14: qty 1
  Filled 2014-10-14: qty 2
  Filled 2014-10-14: qty 1
  Filled 2014-10-14: qty 2
  Filled 2014-10-14: qty 1
  Filled 2014-10-14: qty 2
  Filled 2014-10-14 (×4): qty 1
  Filled 2014-10-14: qty 2
  Filled 2014-10-14 (×2): qty 1
  Filled 2014-10-14: qty 2
  Filled 2014-10-14: qty 1
  Filled 2014-10-14: qty 2
  Filled 2014-10-14: qty 1
  Filled 2014-10-14: qty 2
  Filled 2014-10-14 (×2): qty 1
  Filled 2014-10-14: qty 2
  Filled 2014-10-14 (×2): qty 1

## 2014-10-14 MED ORDER — DEXTROSE 5 % IV SOLN
1.0000 g | Freq: Once | INTRAVENOUS | Status: AC
  Administered 2014-10-14: 1 g via INTRAVENOUS
  Filled 2014-10-14: qty 10

## 2014-10-14 MED ORDER — CITALOPRAM HYDROBROMIDE 10 MG PO TABS
20.0000 mg | ORAL_TABLET | Freq: Every day | ORAL | Status: DC
  Administered 2014-10-16 – 2014-10-25 (×9): 20 mg via ORAL
  Filled 2014-10-14: qty 2
  Filled 2014-10-14: qty 1
  Filled 2014-10-14 (×4): qty 2
  Filled 2014-10-14 (×2): qty 1
  Filled 2014-10-14: qty 2
  Filled 2014-10-14 (×4): qty 1
  Filled 2014-10-14: qty 2

## 2014-10-14 MED ORDER — ZINC SULFATE 220 (50 ZN) MG PO CAPS
220.0000 mg | ORAL_CAPSULE | Freq: Every day | ORAL | Status: DC
  Administered 2014-10-17 – 2014-10-25 (×8): 220 mg via ORAL
  Filled 2014-10-14 (×11): qty 1

## 2014-10-14 MED ORDER — LORAZEPAM 2 MG/ML IJ SOLN
0.5000 mg | Freq: Once | INTRAMUSCULAR | Status: AC
  Administered 2014-10-14: 0.5 mg via INTRAVENOUS
  Filled 2014-10-14: qty 1

## 2014-10-14 MED ORDER — LORAZEPAM 2 MG/ML IJ SOLN
0.5000 mg | INTRAMUSCULAR | Status: DC | PRN
  Administered 2014-10-15 – 2014-10-21 (×9): 0.5 mg via INTRAVENOUS
  Filled 2014-10-14 (×9): qty 1

## 2014-10-14 MED ORDER — VITAMIN D3 25 MCG (1000 UNIT) PO TABS
1000.0000 [IU] | ORAL_TABLET | Freq: Every day | ORAL | Status: DC
  Administered 2014-10-16 – 2014-10-25 (×9): 1000 [IU] via ORAL
  Filled 2014-10-14 (×19): qty 1

## 2014-10-14 MED ORDER — ACETAMINOPHEN 500 MG PO TABS
500.0000 mg | ORAL_TABLET | Freq: Every evening | ORAL | Status: DC | PRN
  Administered 2014-10-18: 500 mg via ORAL
  Filled 2014-10-14: qty 1

## 2014-10-14 NOTE — ED Notes (Signed)
Dr. Conchita ParisNundkumar called back and said to place pt in soft restraints.

## 2014-10-14 NOTE — ED Notes (Signed)
Neuro surgery at bedside.

## 2014-10-14 NOTE — ED Notes (Signed)
MD at bedside discussing CT results.

## 2014-10-14 NOTE — Progress Notes (Signed)
eLink Physician-Brief Progress Note Patient Name: Sharon [name] DOB: May 13, 1946 MRN: [id number]   Date of Service  10/14/2014  HPI/Events of Note  Admitted to NS service for observation in the ICU after fall and traumatic ICH. PMH relevant for dementia, psychiatric dz, CVA. Presently in no respiratory distress  eICU Interventions  Care plan reviewed eLink to continue to monitor     Intervention Category Evaluation Type: New Patient Evaluation  Sharon FischerDavid Donell [name] 10/14/2014, 5:56 PM

## 2014-10-14 NOTE — ED Notes (Signed)
Per EMS- pt had a witnessed fall at 7am. Pt noted to have a rt bloody nose. approx 1 hour after fall pt was witnessed to have "jerking" movements. Upon EMS arrival pt was contracted with rt focal gaze. Pt became postictal with purposeful movements. While en route pt had 2 more seizures lasting approx 1 minute. Pt received 2 of versed. Pt has baseline dementia.

## 2014-10-14 NOTE — ED Notes (Signed)
Pt had 3 episodes of vomitting MD aware. Pt suctioned and positioned upright.

## 2014-10-14 NOTE — ED Notes (Signed)
Pt increasingly agitated and trying to get out of bed. Dr. Conchita ParisNundkumar paged.

## 2014-10-14 NOTE — H&P (Signed)
CC:  Chief Complaint  Patient presents with  . Fall  . Seizures    HPI: Sharon [name] is a 69 y.o. female seen in the ED after suffering a fall and subsequent SZ. History is obtained from the patient's family at bedside. She has a history of advanced dementia and bipolar disorder, apparently fell on some stairs this am around 7. She then had a SZ about 20min later, and another en route to the hospital. She is not on any anticoagulants.  PMH: Past Medical History  Diagnosis Date  . Bipolar 1 disorder   . Dementia   . Other pancytopenia 06/03/2014  . Stroke     PSH: Past Surgical History  Procedure Laterality Date  . Cholecystectomy      SH: History  Substance Use Topics  . Smoking status: Never Smoker   . Smokeless tobacco: Never Used  . Alcohol Use: No    MEDS: Prior to Admission medications   Medication Sig Start Date End Date Taking? Authorizing Provider  acetaminophen (TYLENOL) 500 MG tablet Take 500 mg by mouth at bedtime as needed for moderate pain.   Yes Historical Provider, MD  aspirin EC 81 MG tablet Take 81 mg by mouth at bedtime.   Yes Historical Provider, MD  Cholecalciferol (VITAMIN D3) 1000 UNITS CAPS Take 1 capsule by mouth at bedtime as needed.    Yes Historical Provider, MD  citalopram (CELEXA) 40 MG tablet Take 20 mg by mouth at bedtime.  05/04/14  Yes Historical Provider, MD  Fish Oil-Cholecalciferol (FISH OIL + D3) 1200-1000 MG-UNIT CAPS Take 1,200 mg by mouth at bedtime.    Yes Historical Provider, MD  FLUZONE HIGH-DOSE 0.5 ML SUSY Inject 1 each into the skin once.  04/08/14  Yes Historical Provider, MD  QUEtiapine (SEROQUEL) 300 MG tablet Take 300 mg by mouth 2 (two) times daily.  03/22/14  Yes Historical Provider, MD  tamsulosin (FLOMAX) 0.4 MG CAPS capsule Take 0.4 mg by mouth at bedtime.  03/12/14  Yes Historical Provider, MD  trospium (SANCTURA) 20 MG tablet Take 20 mg by mouth 2 (two) times daily.  05/17/14  Yes Historical Provider, MD  vitamin B-12  (CYANOCOBALAMIN) 500 MCG tablet Take 1,000 mcg by mouth at bedtime.   Yes Historical Provider, MD  zinc sulfate 220 MG capsule Take 220 mg by mouth daily.   Yes Historical Provider, MD  ferrous sulfate 325 (65 FE) MG EC tablet Take 1 tablet (325 mg total) by mouth 2 (two) times daily after a meal. 07/09/14   Malachy MoodYan Feng, MD    ALLERGY: No Known Allergies  ROS: ROS  NEUROLOGIC EXAM: Awake, alert, disoriented Speech fluent CN grossly intact Motor exam:  Moves all extremities with good strength Unable to test sensation  IMGAING: CTH reviewed, with primary finding of a left fronto orbital contusion with mild surrounding edema. There local mass effect, but minimal MLS. No HCP.  IMPRESSION: - 69 y.o. female s/p fall with left frontal contusion and post traumatic SZ. Currently, neurologically well. She has baseline dementia, and current confusion is likely post-ictal.  PLAN: - Admit to ICU for observation - Will cont Keppra 500mg  BID - Stop ASA - Repeat CTH in am

## 2014-10-14 NOTE — ED Provider Notes (Signed)
CSN: [id number]     Arrival date & time 10/14/14  0906 History   First MD Initiated Contact with Patient 10/14/14 [phone]     Chief Complaint  Patient presents with  . Fall  . Seizures     Patient is a 69 y.o. female presenting with fall and seizures. The history is provided by the EMS personnel. No language interpreter was used.  Fall  Seizures  Sharon [name] presents for evaluation of fall and seizures.  Level V caveat due to confusion. Per EMS report she had a fall this morning at home and struck her face and had a nosebleed. This happened around 7:30. About an hour following the fall she developed seizure activity with her eyes gaze to the right. For EMS she had 2 additional seizures that lasted approximately 1 minute each. She received 2.5 mg of Versed prior to ED arrival, which ceased the seizures. 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 She does not take any anticonvulsants currently.  Past Medical History  Diagnosis Date  . Bipolar 1 disorder   . Dementia   . Other pancytopenia 06/03/2014  . Stroke    Past Surgical History  Procedure Laterality Date  . Cholecystectomy     Family History  Problem Relation Age of Onset  . Cancer Mother   . Heart attack Father    History  Substance Use Topics  . Smoking status: Never Smoker   . Smokeless tobacco: Never Used  . Alcohol Use: No   OB History    No data available     Review of Systems  Unable to perform ROS Neurological: Positive for seizures.      Allergies  Review of patient's allergies indicates no known allergies.  Home Medications   Prior to Admission medications   Medication Sig Start Date End Date Taking? Authorizing Provider  acetaminophen (TYLENOL) 500 MG tablet Take 500 mg by mouth at bedtime as needed for moderate pain.    Historical Provider, MD  aspirin EC 81 MG tablet Take 81 mg by mouth at bedtime.    Historical Provider, MD   Cholecalciferol (VITAMIN D3) 1000 UNITS CAPS Take 1 capsule by mouth at bedtime as needed.     Historical Provider, MD  citalopram (CELEXA) 40 MG tablet Take 20 mg by mouth at bedtime.  05/04/14   Historical Provider, MD  ferrous sulfate 325 (65 FE) MG EC tablet Take 1 tablet (325 mg total) by mouth 2 (two) times daily after a meal. 07/09/14   Malachy MoodYan Feng, MD  Fish Oil-Cholecalciferol (FISH OIL + D3) 1200-1000 MG-UNIT CAPS Take 1,200 mg by mouth at bedtime.     Historical Provider, MD  FLUZONE HIGH-DOSE 0.5 ML SUSY Inject 1 each into the skin once.  04/08/14   Historical Provider, MD  QUEtiapine (SEROQUEL) 300 MG tablet Take 300 mg by mouth 2 (two) times daily.  03/22/14   Historical Provider, MD  tamsulosin (FLOMAX) 0.4 MG CAPS capsule Take 0.4 mg by mouth at bedtime.  03/12/14   Historical Provider, MD  trospium (SANCTURA) 20 MG tablet Take 20 mg by mouth 2 (two) times daily.  05/17/14   Historical Provider, MD  vitamin B-12 (CYANOCOBALAMIN) 500 MCG tablet Take 1,000 mcg by mouth at bedtime.    Historical Provider, MD   BP 108/76 mmHg  Pulse 88  Temp(Src) 97.9 F (36.6 C) (Oral)  Resp 21  SpO2 100% Physical Exam  Constitutional: She appears  well-developed and well-nourished.  HENT:  Head: Normocephalic and atraumatic.  Eyes:  Right pupil 2mm and reactive, left pupil 3mm and reactive  Neck:  C collar in place, no C-spine tenderness to palpation  Cardiovascular: Normal rate and regular rhythm.   No murmur heard. Pulmonary/Chest: Effort normal. No respiratory distress.  Rhonchi bilaterally  Abdominal: Soft. There is no tenderness. There is no rebound and no guarding.  Musculoskeletal: She exhibits no edema or tenderness.  Neurological: She is alert.  Nonverbal, intermittently follow some commands. Prefers to gaze to the right but will look to the right and left. She moves all extremities but seems slightly weaker on the left upper left lower extremity.  Skin: Skin is warm and dry.   Psychiatric:  Unable to assess  Nursing note and vitals reviewed.   ED Course  Procedures (including critical care time) CRITICAL CARE Performed by: Tilden Fossa   Total critical care time: 30 minutes  Critical care time was exclusive of separately billable procedures and treating other patients.  Critical care was necessary to treat or prevent imminent or life-threatening deterioration.  Critical care was time spent personally by me on the following activities: development of treatment plan with patient and/or surrogate as well as nursing, discussions with consultants, evaluation of patient's response to treatment, examination of patient, obtaining history from patient or surrogate, ordering and performing treatments and interventions, ordering and review of laboratory studies, ordering and review of radiographic studies, pulse oximetry and re-evaluation of patient's condition.  Labs Review Labs Reviewed  URINALYSIS, ROUTINE W REFLEX MICROSCOPIC - Abnormal; Notable for the following:    APPearance CLOUDY (*)    Leukocytes, UA LARGE (*)    All other components within normal limits  URINE RAPID DRUG SCREEN (HOSP PERFORMED) - Abnormal; Notable for the following:    Benzodiazepines POSITIVE (*)    All other components within normal limits  COMPREHENSIVE METABOLIC PANEL - Abnormal; Notable for the following:    Glucose, Bld 161 (*)    BUN 32 (*)    GFR calc non Af Amer 67 (*)    GFR calc Af Amer 78 (*)    All other components within normal limits  CBC WITH DIFFERENTIAL/PLATELET - Abnormal; Notable for the following:    Hemoglobin 11.3 (*)    HCT 34.0 (*)    Platelets 136 (*)    Neutrophils Relative % 84 (*)    Lymphocytes Relative 10 (*)    All other components within normal limits  TROPONIN I - Abnormal; Notable for the following:    Troponin I 0.06 (*)    All other components within normal limits  URINE MICROSCOPIC-ADD ON - Abnormal; Notable for the following:     Squamous Epithelial / LPF FEW (*)    Bacteria, UA MANY (*)    All other components within normal limits  GLUCOSE, CAPILLARY - Abnormal; Notable for the following:    Glucose-Capillary 169 (*)    All other components within normal limits  URINE CULTURE  CULTURE, BLOOD (ROUTINE X 2)  CULTURE, BLOOD (ROUTINE X 2)  MRSA PCR SCREENING  PROTIME-INR  TYPE AND SCREEN  ABO/RH    Imaging Review Ct Head Wo Contrast  10/14/2014   CLINICAL DATA:  Fall down steps and hit head on concrete.  EXAM: CT HEAD WITHOUT CONTRAST  CT CERVICAL SPINE WITHOUT CONTRAST  TECHNIQUE: Multidetector CT imaging of the head and cervical spine was performed following the standard protocol without intravenous contrast. Multiplanar CT image reconstructions of  the cervical spine were also generated.  COMPARISON:  04/21/2014 brain MRI  FINDINGS: Motion degraded imaging  CT HEAD FINDINGS  Skull and Sinuses:There is a nondepressed fracture through the parasagittal right occipital bone, which reaches the right hypoglossal canal on cervical spine imaging.  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  Orbits: Motion degradation especially limits assessment of the orbits. No pathology detected.  Brain: There is extensive subarachnoid hemorrhage around the left more than right anterior cerebral convexities, with superimposed subdural hemorrhage in the anterior interhemispheric fissure (1 cm thickness) and likely along the left anterior and middle cranial fossa floors. There is edema in the inferior left frontal lobe with a 2.6 x 3 x 2 cm (8cc volume). Edema also present in the left temporal pole, consistent with nonhemorrhagic contusion. Brain swelling results in 4 mm rightward shift.  Critical Value/emergent results were called by telephone at the time of interpretation on 10/14/2014 at 10:21 am to  Dr. Tilden Fossa , who verbally acknowledged these results.  CT CERVICAL SPINE FINDINGS  Motion degraded imaging, especially affecting reformats from the occiput to C3. No acute fracture or traumatic malalignment is suspected. An apparent lucency on axial imaging through the base of the dens is not correlated on reformatted imaging, and is likely from motion. No prevertebral swelling or gross cervical canal hematoma.  IMPRESSION: 1. Best obtainable exam is moderately motion degraded. 2. Left inferior frontal and temporal lobe contusions with 3 cm left frontal hematoma. Swelling results in 4 mm midline shift. 3. Subarachnoid and thin subdural hemorrhage preferentially about the left frontal lobe. 4. Nondepressed right parasagittal occipital bone fracture. 5. No acute cervical spine injury is suspected, but there is motion degradation at the upper cervical spine. If ongoing neck pain or difficult clinical exam, suggest repeat cervical spine CT when intracranial hemorrhage is followed up.   Electronically Signed   By: Marnee Spring M.D.   On: 10/14/2014 10:40   Ct Cervical Spine Wo Contrast  10/14/2014   CLINICAL DATA:  Fall down steps and hit head on concrete.  EXAM: CT HEAD WITHOUT CONTRAST  CT CERVICAL SPINE WITHOUT CONTRAST  TECHNIQUE: Multidetector CT imaging of the head and cervical spine was performed following the standard protocol without intravenous contrast. Multiplanar CT image reconstructions of the cervical spine were also generated.  COMPARISON:  04/21/2014 brain MRI  FINDINGS: Motion degraded imaging  CT HEAD FINDINGS  Skull and Sinuses:There is a nondepressed fracture through the parasagittal right occipital bone, which reaches the right hypoglossal canal on cervical spine imaging.  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  Orbits: Motion  degradation especially limits assessment of the orbits. No pathology detected.  Brain: There is extensive subarachnoid hemorrhage around the left more than right anterior cerebral convexities, with superimposed subdural hemorrhage in the anterior interhemispheric fissure (1 cm thickness) and likely along the left anterior and middle cranial fossa floors. There is edema in the inferior left frontal lobe with a 2.6 x 3 x 2 cm (8cc volume). Edema also present in the left temporal pole, consistent with nonhemorrhagic contusion. Brain swelling results in 4 mm rightward shift.  Critical Value/emergent results were called by telephone at the time of interpretation on 10/14/2014 at 10:21 am to Dr. Tilden Fossa ,  who verbally acknowledged these results.  CT CERVICAL SPINE FINDINGS  Motion degraded imaging, especially affecting reformats from the occiput to C3. No acute fracture or traumatic malalignment is suspected. An apparent lucency on axial imaging through the base of the dens is not correlated on reformatted imaging, and is likely from motion. No prevertebral swelling or gross cervical canal hematoma.  IMPRESSION: 1. Best obtainable exam is moderately motion degraded. 2. Left inferior frontal and temporal lobe contusions with 3 cm left frontal hematoma. Swelling results in 4 mm midline shift. 3. Subarachnoid and thin subdural hemorrhage preferentially about the left frontal lobe. 4. Nondepressed right parasagittal occipital bone fracture. 5. No acute cervical spine injury is suspected, but there is motion degradation at the upper cervical spine. If ongoing neck pain or difficult clinical exam, suggest repeat cervical spine CT when intracranial hemorrhage is followed up.   Electronically Signed   By: Marnee Spring M.D.   On: 10/14/2014 10:40   Dg Chest Port 1 View  10/14/2014   CLINICAL DATA:  Fall following seizure  EXAM: PORTABLE CHEST - 1 VIEW  COMPARISON:  None.  FINDINGS: There is focal infiltrate in the left  base. Elsewhere lungs are clear. Heart size and pulmonary vascularity are normal. No adenopathy. No pneumothorax. No bone lesions.  IMPRESSION: Left base infiltrate.  Lungs otherwise clear.  No pneumothorax.   Electronically Signed   By: Bretta Bang III M.D.   On: 10/14/2014 09:40     EKG Interpretation   Date/Time:  Thursday October 14 2014 09:21:10 EDT Ventricular Rate:  93 PR Interval:  203 QRS Duration: 72 QT Interval:  370 QTC Calculation: 460 R Axis:   81 Text Interpretation:  Sinus rhythm Borderline right axis deviation Low  voltage, precordial leads Minimal ST depression, inferior leads Confirmed  by Lincoln Brigham [phone]) on 10/14/2014 9:40:13 AM      MDM   Final diagnoses:  Seizure  Frontal lobe contusion, left, with loss of consciousness of 30 minutes or less, initial encounter    Patient here for evaluation of new onset seizures following a fall. CT scan demonstrates intracranial hemorrhage. Discussed with neurosurgery who agrees to evaluate the patient and admit. Seizures likely secondary to acute hemorrhage, patient started on Keppra for seizures.    Tilden Fossa, MD 10/14/14 1705

## 2014-10-14 NOTE — ED Notes (Signed)
Attempted report x1. 

## 2014-10-14 NOTE — ED Notes (Signed)
Pt verbal with inappropriate words and agitated. Mittens placed on pt due to attempting to pull lines.

## 2014-10-15 ENCOUNTER — Inpatient Hospital Stay (HOSPITAL_COMMUNITY): Payer: Medicare Other

## 2014-10-15 LAB — GLUCOSE, CAPILLARY: GLUCOSE-CAPILLARY: 185 mg/dL — AB (ref 70–99)

## 2014-10-15 MED ORDER — MORPHINE SULFATE 2 MG/ML IJ SOLN
2.0000 mg | Freq: Once | INTRAMUSCULAR | Status: AC
  Administered 2014-10-15: 2 mg via INTRAVENOUS
  Filled 2014-10-15: qty 1

## 2014-10-15 MED ORDER — MIDAZOLAM HCL 2 MG/2ML IJ SOLN
1.0000 mg | INTRAMUSCULAR | Status: AC | PRN
  Administered 2014-10-15 (×2): 1 mg via INTRAVENOUS
  Filled 2014-10-15: qty 2

## 2014-10-15 MED ORDER — MIDAZOLAM HCL 2 MG/2ML IJ SOLN
5.0000 mg | Freq: Once | INTRAMUSCULAR | Status: AC
  Administered 2014-10-15: 5 mg via INTRAVENOUS
  Filled 2014-10-15: qty 6

## 2014-10-15 NOTE — Progress Notes (Signed)
Had physician on call for neurosurgery paged again. 1st page never returned. Still awaiting call back Juanda BondJames Kailea Dannemiller Critical Care Team  Registered Nurse

## 2014-10-15 NOTE — Progress Notes (Signed)
SLP Cancellation Note  Patient Details Name: Sharon [name] MRN: [id number] DOB: Mar 08, 1946   Cancelled treatment:       Reason Eval/Treat Not Completed: Medical issues which prohibited therapy.  Pt with agitation; not ready for assessment per RN.  Will f/u this afternoon.    Blenda MountsCouture, Teagon Kron Laurice 10/15/2014, 10:16 AM

## 2014-10-15 NOTE — Progress Notes (Signed)
Called neurosurgery office and had on call paged concerning patients CT scan scheduled for 0600. Patient unable to lie still in bed and likely not be able to lie still in the scanner. Awaiting call back. Juanda BondJames Iline Buchinger Critical Care Team  Registered Nurse

## 2014-10-15 NOTE — Progress Notes (Signed)
Utilization review completed. Skylin Kennerson, RN, BSN. 

## 2014-10-15 NOTE — Progress Notes (Signed)
Pt seen and examined. No issues overnight. Pt remains relatively agitated, unable to provide any history.  EXAM: Temp:  [97.9 F (36.6 C)-100.4 F (38 C)] 98.9 F (37.2 C) (03/25 0409) Pulse Rate:  [70-98] 93 (03/25 0725) Resp:  [17-33] 21 (03/25 0725) BP: (108-165)/(42-92) 124/57 mmHg (03/25 0725) SpO2:  [96 %-100 %] 96 % (03/25 0725) Weight:  [51.6 kg (113 lb 12.1 oz)] 51.6 kg (113 lb 12.1 oz) (03/24 1647) Intake/Output      03/24 0701 - 03/25 0700 03/25 0701 - 03/26 0700   I.V. (mL/kg) 1200 (23.3)    IV Piggyback 100    Total Intake(mL/kg) 1300 (25.2)    Urine (mL/kg/hr) 1300    Total Output 1300     Net 0           Awake, alert Speaks unintelligibly Not following commands Moves all extremities symmetrically, good strength  LABS: Lab Results  Component Value Date   CREATININE 0.86 10/14/2014   BUN 32* 10/14/2014   NA 138 10/14/2014   K 3.8 10/14/2014   CL 106 10/14/2014   CO2 22 10/14/2014   Lab Results  Component Value Date   WBC 7.6 10/14/2014   HGB 11.3* 10/14/2014   HCT 34.0* 10/14/2014   MCV 87.6 10/14/2014   PLT 136* 10/14/2014    IMAGING: Unable to perform CT yet due to agitation  IMPRESSION: - 69 y.o. female s/p fall with left frontal contusion, neurologically stable. Remains confused, unclear what her baseline actually is.  PLAN: - Repeat CTH with low-dose versed - Cont to monitor in ICU setting for now - Keppra 500mg  BID

## 2014-10-15 NOTE — Progress Notes (Signed)
Physician for neurosurgery returned page at this time. Orders for medication for CT given. Thanks Juanda BondJames Ramondo Dietze Critical Care Team  Registered Nurse

## 2014-10-15 NOTE — Progress Notes (Signed)
PT Cancellation Note  Patient Details Name: Sharon [name] MRN: [id number] DOB: 1945/12/03   Cancelled Treatment:    Reason Eval/Treat Not Completed: Medical issues which prohibited therapy (pt very restless trying to climb out of bed, given ativan in prep for CT and RN request hold at this point. Pt has not slept, agitated, not following commands and incomprehensible speech per RN Hermelinda DellenKeneisha will attempt next date)   Delorse Lekabor, Kerry Chisolm Beth 10/15/2014, 7:48 AM Delaney MeigsMaija Tabor Halaina Vanduzer, PT [phone]

## 2014-10-16 ENCOUNTER — Inpatient Hospital Stay (HOSPITAL_COMMUNITY): Payer: Medicare Other

## 2014-10-16 LAB — GLUCOSE, CAPILLARY
GLUCOSE-CAPILLARY: 121 mg/dL — AB (ref 70–99)
Glucose-Capillary: 138 mg/dL — ABNORMAL HIGH (ref 70–99)
Glucose-Capillary: 139 mg/dL — ABNORMAL HIGH (ref 70–99)

## 2014-10-16 LAB — URINE CULTURE: Colony Count: >=100000

## 2014-10-16 MED ORDER — SULFAMETHOXAZOLE-TRIMETHOPRIM 800-160 MG PO TABS
1.0000 | ORAL_TABLET | Freq: Two times a day (BID) | ORAL | Status: AC
  Administered 2014-10-16 – 2014-10-22 (×11): 1 via ORAL
  Filled 2014-10-16 (×17): qty 1

## 2014-10-16 NOTE — Progress Notes (Signed)
SLP Cancellation Note  Patient Details Name: Sharon GammaJoan L Rud MRN: [id number] DOB: 31-Oct-1945   Cancelled treatment:        Attempted swallow assessment. Repositioned pt multiple times with assist, pt constantly moving. SLP unable to sustain adequate and safe positioning to administer po's in addition to poor attention and awareness to surroundings/environment. Will continue efforts.   Royce MacadamiaLitaker, Randell Detter Willis 10/16/2014, 5:40 PM  Breck CoonsLisa Willis Lonell FaceLitaker M.Ed [employer] Pager [phone]

## 2014-10-16 NOTE — Evaluation (Signed)
Physical Therapy Evaluation Patient Details Name: Sharon [name] MRN: [id number] DOB: 05-Nov-1945 Today's Date: 10/16/2014   History of Present Illness  69 y.o. female s/p fall with left frontal contusion and post traumatic SZ; positive UTI  Past Medical History  Diagnosis Date  . Bipolar 1 disorder   . Dementia   . Other pancytopenia 06/03/2014  . Stroke    Past Surgical History  Procedure Laterality Date  . Cholecystectomy       Clinical Impression  Pt admitted with above diagnosis. Pt currently with functional limitations due to the deficits listed below (see PT Problem List).  Pt will benefit from skilled PT to increase their independence and safety with mobility to allow discharge to the venue listed below.    Hopeful for improvements in functional and cognition as her medical course progresses, and she will be better able to participate in therapies; Will also need more info related to her baseline cognition and function given her history of dementia; At this point, I favor dc to SNF for post-acute rehab to maximize independence and safety with mobility, and function.    Follow Up Recommendations SNF;Supervision/Assistance - 24 hour    Equipment Recommendations  Other (comment) (to be determined as pt progresses)    Recommendations for Other Services OT consult     Precautions / Restrictions Precautions Precautions: Fall Restrictions Weight Bearing Restrictions: Yes      Mobility  Bed Mobility Overal bed mobility: Needs Assistance;+2 for physical assistance;+ 2 for safety/equipment Bed Mobility: Supine to Sit;Sit to Supine     Supine to sit: +2 for physical assistance;+2 for safety/equipment;Total assist Sit to supine: +2 for physical assistance;+2 for safety/equipment;Mod assist   General bed mobility comments: Total assist to move pt from R sidelying to sit, with use of bed pad to cradle hips and support given at trunk and pelvic girdle; less assist needed to  help her back to lying down as she was actively trying to lie down almost as soon as we got her up to sitting  Transfers Overall transfer level: Needs assistance Equipment used: 2 person hand held assist (and bed pad) Transfers: Lateral/Scoot Transfers          Lateral/Scoot Transfers: Total assist;+2 safety/equipment;+2 physical assistance General transfer comment: Lateral scoot towards HOB while sitting EOB in prep for lying down; bil knees blocked and heavy use of bed pad to guide and scoot hips  Ambulation/Gait                Stairs            Wheelchair Mobility    Modified Rankin (Stroke Patients Only)       Balance Overall balance assessment: Needs assistance Sitting-balance support: Single extremity supported;Bilateral upper extremity supported;Feet unsupported Sitting balance-Leahy Scale: Zero Sitting balance - Comments: Sat Edge of bed for a very short amount of time before actively working to lay herself back down to sidelying; initially needed total assist to stabilize pelvis and lower trunk to prevent her from sliding forward off of the bed; did not follow any commands related to functional mobility and posture                                     Pertinent Vitals/Pain Pain Assessment: Faces Faces Pain Scale: Hurts even more Pain Location: pt unable to state, but did engage in the goal-directed behavior of actively pushing posteriorly to  lay back down Pain Descriptors / Indicators: Grimacing Pain Intervention(s): Repositioned    Home Living Family/patient expects to be discharged to:: Other (Comment)                 Additional Comments: Pt unable to provide information related to home situationa nd available assist; We do know that she lives with her husband, who was unavailable at time of eval; Given PMH and current functuional status, SNF for rehab is a safe, reasonable option    Prior Function           Comments: Need  more information re: PLOF     Hand Dominance        Extremity/Trunk Assessment   Upper Extremity Assessment: Difficult to assess due to impaired cognition (No gross assymetries in strength noted)           Lower Extremity Assessment: Difficult to assess due to impaired cognition (No gross assymetries in strength noted)      Cervical / Trunk Assessment: Normal  Communication   Communication: Expressive difficulties  Cognition Arousal/Alertness: Lethargic (Did not open eyes until sitting up on EOB) Behavior During Therapy: Restless Overall Cognitive Status: No family/caregiver present to determine baseline cognitive functioning Area of Impairment: Following commands     Memory: Decreased short-term memory (Noted history of advanced dementia) Following Commands: Follows one step commands inconsistently       General Comments: did not respond to simple one step commands ("open your eyes", "squeeze my hand"); Eyes closed initially, but she did open her eyes spontaneously once sitting up at [facility] of Bed; minimal vocalizations, none intelligible; pt did engage in the goal-directed behavior of bringing LEs back onto bed, and pushing, turning and pulling self to lay back down    General Comments General comments (skin integrity, edema, etc.): See vitals doc flowsheet for BPs taken during session    Exercises        Assessment/Plan    PT Assessment Patient needs continued PT services  PT Diagnosis Difficulty walking;Generalized weakness   PT Problem List Decreased strength;Decreased activity tolerance;Decreased balance;Decreased mobility;Decreased coordination;Decreased cognition;Decreased knowledge of use of DME;Decreased safety awareness;Decreased knowledge of precautions  PT Treatment Interventions DME instruction;Gait training;Stair training;Functional mobility training;Therapeutic activities;Therapeutic exercise;Balance training;Neuromuscular re-education;Cognitive  remediation;Patient/family education   PT Goals (Current goals can be found in the Care Plan section) Acute Rehab PT Goals PT Goal Formulation: Patient unable to participate in goal setting Time For Goal Achievement: 10/30/14 Potential to Achieve Goals: Fair    Frequency Min 3X/week   Barriers to discharge   Need more information re: availble caregiver assist and home situation    Co-evaluation               End of Session Equipment Utilized During Treatment:  (bed pad and mit restraints) Activity Tolerance: Patient limited by lethargy (limited also by restlessness) Patient left: in bed;with call bell/phone within reach;with bed alarm set Nurse Communication: Mobility status         Time: 1191-4782 PT Time Calculation (min) (ACUTE ONLY): 30 min   Charges:   PT Evaluation $Initial PT Evaluation Tier I: 1 Procedure PT Treatments $Therapeutic Activity: 8-22 mins   PT G Codes:        Olen Pel 10/16/2014, 9:31 AM  Van Clines, PT  Acute Rehabilitation Services Pager [phone] Office [phone]

## 2014-10-16 NOTE — Progress Notes (Signed)
Pt seen and examined. No issues overnight.  EXAM: Temp:  [98.2 F (36.8 C)-98.5 F (36.9 C)] 98.5 F (36.9 C) (03/25 1549) Pulse Rate:  [74-95] 74 (03/26 0500) Resp:  [16-32] 23 (03/26 0500) BP: (88-163)/(34-98) 111/57 mmHg (03/26 0500) SpO2:  [91 %-99 %] 91 % (03/26 0500) Intake/Output      03/25 0701 - 03/26 0700 03/26 0701 - 03/27 0700   I.V. (mL/kg) 1650 (32)    IV Piggyback 100    Total Intake(mL/kg) 1750 (33.9)    Urine (mL/kg/hr) 1230 (1)    Stool 0 (0)    Total Output 1230     Net +520          Stool Occurrence 3 x     Sleepy, but arouses easily Speaks unintelligibly Not following commands Moves all extremities Appears to move neck with good ROM and without discomfort  LABS: Lab Results  Component Value Date   CREATININE 0.86 10/14/2014   BUN 32* 10/14/2014   NA 138 10/14/2014   K 3.8 10/14/2014   CL 106 10/14/2014   CO2 22 10/14/2014   Lab Results  Component Value Date   WBC 7.6 10/14/2014   HGB 11.3* 10/14/2014   HCT 34.0* 10/14/2014   MCV 87.6 10/14/2014   PLT 136* 10/14/2014    IMAGING: CTH reviewed demonstrating relatively stable left frontal contusion, small right contusion also. Also seen in thin right convexity SDH, and a right cerebellar lobar contusion.  CT cspine although motion degraded appears negative for fx/sublux.  IMPRESSION: - 69 y.o. female with relatively severe TBI, appears neurologically stable - UA/Urine culture (+) - C-spine appears clear  PLAN: - Cont to monitor in ICU - Will add PO Bactrim DS x 7d - Cont Keppra - D/C collar

## 2014-10-16 NOTE — Progress Notes (Signed)
eLink Physician-Brief Progress Note Patient Name: Sharon [name] DOB: [birth year] MRN: [id number]   Date of Service  10/16/2014  HPI/Events of Note  Nurse requests restraint to keep NGT in place.   eICU Interventions  Will order bilateral wrist restraints.      Intervention Category Major Interventions: Delirium, psychosis, severe agitation - evaluation and management  Sommer,Steven Eugene 10/16/2014, 9:00 PM

## 2014-10-16 NOTE — Progress Notes (Signed)
NG Tube was ordered for pt. Pt is restless in the bed at this time. Pt was given Ativan 0.5 mg. MD was paged to advise about pt's status. Will wait to see if MD has any suggestions to get NG Tube in and keep pt from pulling NG Tube out.

## 2014-10-16 NOTE — Progress Notes (Signed)
eLink Physician-Brief Progress Note Patient Name: Sharon [name] DOB: 1945/11/03 MRN: [id number]   Date of Service  10/16/2014  HPI/Events of Note  Patient failed swallow study. Currently on several oral medications.   eICU Interventions  Will order NGT placed.      Intervention Category Minor Interventions: Agitation / anxiety - evaluation and management;Routine modifications to care plan (e.g. PRN medications for pain, fever)  Sommer,Steven Eugene 10/16/2014, 7:35 PM

## 2014-10-17 MED ORDER — WHITE PETROLATUM GEL
1.0000 "application " | Status: DC | PRN
  Administered 2014-10-17 – 2014-10-21 (×2): 1 via TOPICAL

## 2014-10-17 NOTE — Progress Notes (Signed)
Patient ID: Sharon [name], female   DOB: 1945-10-30, 69 y.o.   MRN: [id number] Vital signs are stable Neurologic function remains poor Patient disoriented Moans and groans incoherently Continue supportive care

## 2014-10-17 NOTE — Progress Notes (Deleted)
eLink Physician-Brief Progress Note Patient Name: Elenora GammaJoan L Scritchfield DOB: [birth year] MRN: [id number]   Date of Service  10/17/2014  HPI/Events of Note  Pt intubated.  eICU Interventions  Order lovenox for DVT proph, and protonix for SUP.     Intervention Category Major Interventions: Other:  Suman Trivedi 10/17/2014, 11:40 PM

## 2014-10-17 NOTE — Progress Notes (Signed)
SLP Cancellation Note  Patient Details Name: [name] MRN: [id number] DOB: 23-Feb-1946   Cancelled treatment:       Reason Eval/Treat Not Completed: Medical issues which prohibited therapy. Spoke with RN; pt not yet ready for SLP evaluation.  Will follow for readiness.    Blenda MountsCouture, Shan Padgett Laurice 10/17/2014, 10:49 AM

## 2014-10-18 DIAGNOSIS — G9340 Encephalopathy, unspecified: Secondary | ICD-10-CM

## 2014-10-18 DIAGNOSIS — I629 Nontraumatic intracranial hemorrhage, unspecified: Secondary | ICD-10-CM

## 2014-10-18 LAB — GLUCOSE, CAPILLARY
GLUCOSE-CAPILLARY: 137 mg/dL — AB (ref 70–99)
Glucose-Capillary: 145 mg/dL — ABNORMAL HIGH (ref 70–99)
Glucose-Capillary: 143 mg/dL — ABNORMAL HIGH (ref 70–99)

## 2014-10-18 LAB — BASIC METABOLIC PANEL
Anion gap: 10 (ref 5–15)
BUN: 16 mg/dL (ref 6–23)
CALCIUM: 8.1 mg/dL — AB (ref 8.4–10.5)
CO2: 18 mmol/L — ABNORMAL LOW (ref 19–32)
Chloride: 119 mmol/L — ABNORMAL HIGH (ref 96–112)
Creatinine, Ser: 0.69 mg/dL (ref 0.50–1.10)
GFR calc Af Amer: >90 mL/min (ref >90)
GFR calc non Af Amer: 86 mL/min — ABNORMAL LOW (ref >90)
Glucose, Bld: 135 mg/dL — ABNORMAL HIGH (ref 70–99)
Potassium: 3.4 mmol/L — ABNORMAL LOW (ref 3.5–5.1)
Sodium: 147 mmol/L — ABNORMAL HIGH (ref 135–145)

## 2014-10-18 LAB — CBC
HCT: 24.5 % — ABNORMAL LOW (ref 36.0–46.0)
Hemoglobin: 8.2 g/dL — ABNORMAL LOW (ref 12.0–15.0)
MCH: 29.1 pg (ref 26.0–34.0)
MCHC: 33.5 g/dL (ref 30.0–36.0)
MCV: 86.9 fL (ref 78.0–100.0)
PLATELETS: 96 10*3/uL — AB (ref 150–400)
RBC: 2.82 MIL/uL — AB (ref 3.87–5.11)
RDW: 14.3 % (ref 11.5–15.5)
WBC: 2.8 10*3/uL — ABNORMAL LOW (ref 4.0–10.5)

## 2014-10-18 LAB — PHOSPHORUS: Phosphorus: 3.3 mg/dL (ref 2.3–4.6)

## 2014-10-18 LAB — MAGNESIUM
Magnesium: 1.9 mg/dL (ref 1.5–2.5)
Magnesium: 1.9 mg/dL (ref 1.5–2.5)

## 2014-10-18 MED ORDER — IPRATROPIUM-ALBUTEROL 0.5-2.5 (3) MG/3ML IN SOLN
3.0000 mL | Freq: Four times a day (QID) | RESPIRATORY_TRACT | Status: DC
Start: 2014-10-18 — End: 2014-10-21
  Administered 2014-10-18 – 2014-10-21 (×9): 3 mL via RESPIRATORY_TRACT
  Filled 2014-10-18 (×9): qty 3

## 2014-10-18 MED ORDER — PANTOPRAZOLE SODIUM 40 MG PO PACK
40.0000 mg | PACK | Freq: Every day | ORAL | Status: DC
  Administered 2014-10-19 – 2014-10-25 (×6): 40 mg
  Filled 2014-10-18 (×8): qty 20

## 2014-10-18 MED ORDER — PRO-STAT SUGAR FREE PO LIQD
30.0000 mL | Freq: Every day | ORAL | Status: DC
  Administered 2014-10-19: 30 mL
  Filled 2014-10-18 (×2): qty 30

## 2014-10-18 MED ORDER — VITAL HIGH PROTEIN PO LIQD
1000.0000 mL | ORAL | Status: DC
  Filled 2014-10-18 (×3): qty 1000

## 2014-10-18 MED ORDER — PRO-STAT SUGAR FREE PO LIQD
30.0000 mL | Freq: Two times a day (BID) | ORAL | Status: AC
  Administered 2014-10-18: 30 mL
  Filled 2014-10-18 (×3): qty 30

## 2014-10-18 MED ORDER — IPRATROPIUM-ALBUTEROL 0.5-2.5 (3) MG/3ML IN SOLN
3.0000 mL | RESPIRATORY_TRACT | Status: DC | PRN
  Administered 2014-10-18: 3 mL via RESPIRATORY_TRACT
  Filled 2014-10-18: qty 3

## 2014-10-18 MED ORDER — JEVITY 1.2 CAL PO LIQD
1000.0000 mL | ORAL | Status: DC
  Administered 2014-10-18: 25 mL/h
  Filled 2014-10-18: qty 1000
  Filled 2014-10-18: qty 237
  Filled 2014-10-18 (×2): qty 1000

## 2014-10-18 MED ORDER — VITAL HIGH PROTEIN PO LIQD: 1000.0000 mL | ORAL | Status: DC

## 2014-10-18 MED ORDER — FREE WATER
200.0000 mL | Freq: Three times a day (TID) | Status: DC
  Administered 2014-10-18 – 2014-10-20 (×6): 200 mL

## 2014-10-18 MED ORDER — PRO-STAT SUGAR FREE PO LIQD: 30.0000 mL | Freq: Two times a day (BID) | ORAL | Status: DC

## 2014-10-18 NOTE — Progress Notes (Signed)
Pt seen and examined. No issues overnight.  EXAM: Temp:  [97 F (36.1 C)-98.3 F (36.8 C)] 97 F (36.1 C) (03/28 1208) Pulse Rate:  [78-102] 88 (03/28 1200) Resp:  [17-28] 19 (03/28 1200) BP: (87-152)/(56-116) 144/63 mmHg (03/28 1200) SpO2:  [92 %-100 %] 95 % (03/28 1208) Intake/Output      03/27 0701 - 03/28 0700 03/28 0701 - 03/29 0700   I.V. (mL/kg) 1800 (34.9) 375 (7.3)   NG/GT  120   IV Piggyback 200 100   Total Intake(mL/kg) 2000 (38.8) 595 (11.5)   Urine (mL/kg/hr)     Total Output       Net +2000 +595        Urine Occurrence 3 x 1 x   Stool Occurrence 1 x     Sleepy but easily arousable Does says she's "okay" Does follow simple commands Moves all extremities   LABS: Lab Results  Component Value Date   CREATININE 0.86 10/14/2014   BUN 32* 10/14/2014   NA 138 10/14/2014   K 3.8 10/14/2014   CL 106 10/14/2014   CO2 22 10/14/2014   Lab Results  Component Value Date   WBC 7.6 10/14/2014   HGB 11.3* 10/14/2014   HCT 34.0* 10/14/2014   MCV 87.6 10/14/2014   PLT 136* 10/14/2014    IMPRESSION: - 69 y.o. female s/p fall with severe TBI in setting of baseline dementia, psychiatric disorder.   PLAN: - Can transfer to floor - Code status changed to DNR after PCCM had discussion with pts husband - Will likely need placement

## 2014-10-18 NOTE — Progress Notes (Signed)
Physical Therapy Treatment Patient Details Name: Sharon [name] MRN: [id number] DOB: 1945-12-30 Today's Date: 10/18/2014    History of Present Illness 69 y.o. female s/p fall with left frontal bleed, R cerebellar contusion, R basal skull fx,  and post traumatic seizures; positive UTI    PT Comments    Noting improvements in responsiveness to commands, and increased arousal compared to session on 3/26; continues with restless, writhing movements, but  Able to organize and stabilize trunk and center of mass during transition to standing when hips translated forward over feet; Behaviors consistent with Rancho III (localized response) with noted emerging Rancho IV (confused, agitated) behaviors (pulling at mitts, pushing to return to laying in bed);   Husband present during session, and stated that prior to this incident, pt had been falling at home over the past few months; she was undergoing outpatient workup for balance which had revealed a prior stroke, and dementia; He also stated he had been considering transitioning her to a higher level of care (ALF versus SNF) prior to this incident   Follow Up Recommendations  SNF;Supervision/Assistance - 24 hour     Equipment Recommendations  Other (comment) (to be determined as pt progresses)    Recommendations for Other Services OT consult     Precautions / Restrictions Restrictions Weight Bearing Restrictions: No    Mobility  Bed Mobility Overal bed mobility: Needs Assistance;+2 for physical assistance;+ 2 for safety/equipment Bed Mobility: Supine to Sit;Sit to Supine     Supine to sit: +2 for physical assistance;+2 for safety/equipment;Mod assist Sit to supine: +2 for safety/equipment;+2 for physical assistance;Total assist   General bed mobility comments: initially following commands related to getting up to sitting, moving towards EOB; assist to clear feet from EOB and elevate trunk to sitting; second person needed for safety and  stability in transition to sit and initially once sitting up; After standing trial, pt sat back to bed and pushed back and to the right to lay back down, resulting in her laying almost perpendicular in the bed, and requiring total assist to acheive optimal, safe positioning in bed  Transfers Overall transfer level: Needs assistance Equipment used: 2 person hand held assist Transfers: Sit to/from Stand Sit to Stand: +2 safety/equipment;+2 physical assistance;Mod assist         General transfer comment: Light mod assist to power-up to stand and for steadiness once standing; Close guard of knees in case of buckling; Stood approx 15 seconds before sitting back down  Ambulation/Gait                 Stairs            Wheelchair Mobility    Modified Rankin (Stroke Patients Only)       Balance Overall balance assessment: Needs assistance Sitting-balance support: Single extremity supported;Bilateral upper extremity supported;Feet supported Sitting balance-Leahy Scale: Poor (approaching Fair)     Standing balance support: Bilateral upper extremity supported Standing balance-Leahy Scale: Poor Standing balance comment: able to organize center of mass and trunk  over feet (base of support) during dynamic transition to standing                    Cognition Arousal/Alertness: Lethargic (But improved from last session on 3/26) Behavior During Therapy: Restless Overall Cognitive Status: Impaired/Different from baseline Area of Impairment: Following commands     Memory: Decreased short-term memory (Noted history of advanced dementia) Following Commands: Follows one step commands inconsistently  General Comments: Eyes opened to greeting this session; Able to attempt to answer the question "what is your birthday"; responded to her husband calling her nickname, "Snook", but scanning room to find him; sat back down and pushed back to lying down after approx 15  seconds of standing    Exercises      General Comments        Pertinent Vitals/Pain Pain Assessment: Faces Faces Pain Scale: Hurts a little bit Pain Location: General discomfort with sitting up EOB and standing; Unable to specify pain, but clearly wanting to lay back down Pain Intervention(s): Repositioned    Home Living     Available Help at Discharge: Family Type of Home: House              Prior Function            PT Goals (current goals can now be found in the care plan section) Acute Rehab PT Goals PT Goal Formulation: Patient unable to participate in goal setting Time For Goal Achievement: 10/30/14 Potential to Achieve Goals: Fair Additional Goals Additional Goal #1: Pt will follow at least 75% of simple commands related to mobility Progress towards PT goals: Progressing toward goals    Frequency  Min 3X/week    PT Plan Current plan remains appropriate    Co-evaluation             End of Session Equipment Utilized During Treatment: Gait belt Activity Tolerance: Other (comment) (Restless and actively working to lay back down in bed) Patient left: in bed;with call bell/phone within reach;with family/visitor present     Time: 1610-9604 PT Time Calculation (min) (ACUTE ONLY): 16 min  Charges:  $Therapeutic Activity: 8-22 mins                    G Codes:      Olen Pel 10/18/2014, 2:53 PM  [place] Cl[place], [place]  Acute Rehabilitation Services Pager [phone] Office [phone]

## 2014-10-18 NOTE — Consult Note (Signed)
Name: Sharon [name] MRN: [id number] DOB: [birth year]    ADMISSION DATE:  10/14/2014 CONSULTATION DATE:  10/18/2014   REFERRING MD :  Conchita Paris  CHIEF COMPLAINT:  fall  BRIEF PATIENT DESCRIPTION: 69 y.o with bipolar x 40 urs & dementia x 89yr adm 3/24 s/p fall in garage. Head CT showed skull fractures, left frontal & temporal contusion & occipital ICH, extensive SAh & mild SDH - 4mm midline shift. No cervical injury was noted. She was admitted for observation.  Did not clear swallow eval, PCCM consulted 3/28 for med mx &  PVCs on monitor  SIGNIFICANT EVENTS    STUDIES:  Head CT 3/25 Newly seen hemorrhagic contusion in the right cerebellum. No change in right-sided skullbase and skull fractures.  No appreciable change in hemorrhagic contusion most pronounced in the left frontal lobe but also affecting the right frontal lobe and the left temporal tip. Subarachnoid hemorrhage remains visible, left more than right. 3 mm left-to-right shift, not significantly changed.    PAST MEDICAL HISTORY :   has a past medical history of Bipolar 1 disorder; Dementia; Other pancytopenia (06/03/2014); and Stroke.  has past surgical history that includes Cholecystectomy. Prior to Admission medications   Medication Sig Start Date End Date Taking? Authorizing Provider  acetaminophen (TYLENOL) 500 MG tablet Take 500 mg by mouth at bedtime as needed for moderate pain.   Yes Historical Provider, MD  aspirin EC 81 MG tablet Take 81 mg by mouth at bedtime.   Yes Historical Provider, MD  Cholecalciferol (VITAMIN D3) 1000 UNITS CAPS Take 1 capsule by mouth at bedtime as needed.    Yes Historical Provider, MD  citalopram (CELEXA) 40 MG tablet Take 20 mg by mouth at bedtime.  05/04/14  Yes Historical Provider, MD  Fish Oil-Cholecalciferol (FISH OIL + D3) 1200-1000 MG-UNIT CAPS Take 1,200 mg by mouth at bedtime.    Yes Historical Provider, MD  FLUZONE HIGH-DOSE 0.5 ML SUSY Inject 1 each into the skin once.  04/08/14   Yes Historical Provider, MD  QUEtiapine (SEROQUEL) 300 MG tablet Take 300 mg by mouth 2 (two) times daily.  03/22/14  Yes Historical Provider, MD  tamsulosin (FLOMAX) 0.4 MG CAPS capsule Take 0.4 mg by mouth at bedtime.  03/12/14  Yes Historical Provider, MD  trospium (SANCTURA) 20 MG tablet Take 20 mg by mouth 2 (two) times daily.  05/17/14  Yes Historical Provider, MD  vitamin B-12 (CYANOCOBALAMIN) 500 MCG tablet Take 1,000 mcg by mouth at bedtime.   Yes Historical Provider, MD  zinc sulfate 220 MG capsule Take 220 mg by mouth daily.   Yes Historical Provider, MD  ferrous sulfate 325 (65 FE) MG EC tablet Take 1 tablet (325 mg total) by mouth 2 (two) times daily after a meal. 07/09/14   Malachy Mood, MD   No Known Allergies  FAMILY HISTORY:  family history includes Cancer in her mother; Heart attack in her father. SOCIAL HISTORY:  reports that she has never smoked. She has never used smokeless tobacco. She reports that she does not drink alcohol or use illicit drugs.  REVIEW OF SYSTEMS:   Unable to obtain since altered  SUBJECTIVE:   VITAL SIGNS: Temp:  [97 F (36.1 C)-98.3 F (36.8 C)] 97 F (36.1 C) (03/28 1208) Pulse Rate:  [78-102] 88 (03/28 1200) Resp:  [17-28] 19 (03/28 1200) BP: (87-152)/(56-116) 144/63 mmHg (03/28 1200) SpO2:  [92 %-100 %] 95 % (03/28 1208)  PHYSICAL EXAMINATION: General:  Chr ill appearing Neuro:  RASS -  1, follows 1 step commands, HEENT:  Pupils 3mm RTL, no JVD Cardiovascular:  s1s2 nml Lungs:  clear Abdomen:  Soft, non tender Musculoskeletal:  No edema Skin:  No rash   Recent Labs Lab 10/14/14 1030  NA 138  K 3.8  CL 106  CO2 22  BUN 32*  CREATININE 0.86  GLUCOSE 161*    Recent Labs Lab 10/14/14 1030  HGB 11.3*  HCT 34.0*  WBC 7.6  PLT 136*   Dg Abd Portable 1v  10/16/2014   CLINICAL DATA:  NG tube placement  EXAM: PORTABLE ABDOMEN - 1 VIEW  COMPARISON:  None.  FINDINGS: NG tube tip is in the mid portion of the stomach.  Nonobstructive bowel gas pattern.  IMPRESSION: NG tube tip in the mid stomach.   Electronically Signed   By: Charlett NoseKevin  Dover M.D.   On: 10/16/2014 22:18    ASSESSMENT / PLAN:  Acute encephalopathy TBI with skull fracture, brain contusion, ICHH Baseline dementia PVCs  Recommend - Good discussion with husband, she had a poor baseline & was deteriorating pre -fall. He was contemplating placement in a NH. Agreed to DNR, prognosis discussed Will check lytes this am & correct as needed Start TFs, keep npo until cleared by swallow . She  Will need placment eventually Ok to transfer to SDU   Cyril Mourningakesh Tericka Devincenzi MD. Tonny BollmanFCCP. [facility] Pager [phone] If no response call [phone]    10/18/2014, 12:32 PM

## 2014-10-18 NOTE — Evaluation (Signed)
Speech Language Pathology Evaluation Patient Details Name: JAMA MCMILLER MRN: [id number] DOB: 1945-12-24 Today's Date: 10/18/2014 Time: 4098-1191 SLP Time Calculation (min) (ACUTE ONLY): 27 min  Problem List:  Patient Active Problem List   Diagnosis Date Noted  . Intracranial hemorrhage 10/14/2014  . Other pancytopenia 06/03/2014   Past Medical History:  Past Medical History  Diagnosis Date  . Bipolar 1 disorder   . Dementia   . Other pancytopenia 06/03/2014  . Stroke    Past Surgical History:  Past Surgical History  Procedure Laterality Date  . Cholecystectomy     HPI:  Pt is a 69 y/o female with PMH of advanced dementia and bipolar disorder who came in to [facility] following a fall at home and seizure activity. Ct revealed hemorrhagic contusion in the R cerebellum. Pt has no prior hx of dysphagia.   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 Behavior consistent with Rancho III (Localized response) with many emerging Rancho IV behaviors (confused, agitated) depending on level of arousal and stimuli. Today pt was seen in the bed with intermittent focused to briefly sustained attention to verbal and functional tasks. Pt was able to follow commands and participate in simple ADL's with max verbal and hand over hand assist. Pt able to track and reach for objects in central visual field, search for husband on the left. Likely pt will show increased responsiveness with mobilization with TBI team. Plan to return with PT or OT to maximize pts opportunities for cognitive recovery. Recommend CIR consult.     SLP Assessment  Patient needs continued Speech Lanaguage Pathology Services    Follow Up Recommendations  Inpatient Rehab    Frequency and Duration min 3x week  2 weeks   Pertinent Vitals/Pain Pain Assessment: Faces Faces Pain Scale: No hurt   SLP Goals  Potential to Achieve Goals (ACUTE ONLY):  Good Potential Considerations (ACUTE ONLY): Co-morbidities;Ability to learn/carryover information;Previous level of function  SLP Evaluation Prior Functioning  Cognitive/Linguistic Baseline: Baseline deficits Baseline deficit details: dementia Type of Home: House  Lives With: Spouse Available Help at Discharge: Family   Cognition  Overall Cognitive Status: Impaired/Different from baseline Arousal/Alertness: Lethargic Orientation Level: Oriented to person;Oriented to place;Disoriented to time;Disoriented to situation Attention: Focused;Sustained Focused Attention: Impaired Focused Attention Impairment: Verbal basic;Functional basic Sustained Attention: Impaired Sustained Attention Impairment: Verbal basic;Functional basic Memory: Impaired Memory Impairment: Retrieval deficit;Decreased recall of new information Awareness: Impaired Awareness Impairment: Intellectual impairment;Emergent impairment;Anticipatory impairment Problem Solving: Impaired Problem Solving Impairment: Verbal basic;Functional basic Executive Function: Initiating Initiating: Impaired Initiating Impairment: Verbal basic;Functional basic Behaviors: Other (comment) (RN reports pt intermittently restless) Safety/Judgment: Impaired Rancho [employer] Scales of Cognitive Functioning: Localized response (Emerging IV)    Comprehension  Auditory Comprehension Overall Auditory Comprehension: Impaired Yes/No Questions: Impaired Basic Biographical Questions: 26-50% accurate Commands: Impaired One Step Basic Commands: 25-49% accurate Two Step Basic Commands: 0-24% accurate Conversation: Simple Interfering Components: Attention EffectiveTechniques: Increased volume;Repetition Visual Recognition/Discrimination Discrimination: Not tested Reading Comprehension Reading Status: Not tested    Expression Expression Primary Mode of Expression: Verbal Verbal Expression Overall Verbal Expression: Impaired Initiation:  Impaired Automatic Speech: Name Level of Generative/Spontaneous Verbalization: Word Repetition: Impaired Naming: Not tested Interfering Components: Attention;Speech intelligibility   Oral / Motor Oral Motor/Sensory Function Overall Oral Motor/Sensory Function: Other (comment) (limited participation, no focal weakness, left eye ptosis) Labial ROM: Within Functional Limits Lingual ROM: Other (Comment) (Unable to assess) Lingual Symmetry: Within Functional Limits Facial ROM: Within Functional  Limits Motor Speech Overall Motor Speech: Impaired Respiration: Within functional limits Phonation: Low vocal intensity Resonance: Within functional limits Articulation: Impaired Level of Impairment: Word Intelligibility: Intelligibility reduced Word: 25-49% accurate Phrase: 0-24% accurate Sentence: Not tested Conversation: Not tested   GO    Harlon DittyBonnie Levia Waltermire, MA CCC-SLP [phone]  [name], Dayle Sherpa Caroline 10/18/2014, 12:30 PM

## 2014-10-18 NOTE — Progress Notes (Deleted)
SLP Cancellation Note  Patient Details Name: Elenora GammaJoan L Metzner MRN: [id number] DOB: 1946/03/24   Cancelled treatment:       Reason Eval/Treat Not Completed: Medical issues which prohibited therapy.    Dhwani Venkatesh, Riley NearingBonnie Caroline 10/18/2014, 8:38 AM

## 2014-10-18 NOTE — Progress Notes (Signed)
Patient arrived to room from 57M accompanied by husband. Safety precautions and orders reviewed with patient/family. Patient with restraints in place. She appears in no distress at this time with the exception of attempting to removed NG tube. Awaiting for feeding supplement to start on tube feeding. Will continue to monitor.  Mavric Cortright, RN.

## 2014-10-18 NOTE — Clinical Social Work Note (Signed)
CSW left the pt's husband Sharon [name] a voice message regarding SNF placement. CSW [place] a [place] call back from [place]. CSW will follow and assist with discharge needs.   Sharon [name], MSW, LCSWA [phone]

## 2014-10-18 NOTE — Progress Notes (Signed)
Writer explained risks versus benefits of tube feeding to family. Since patient able to wiggle and slide down making her flat on back. Staffs repositioned patient multiple times during shift since 1430. MD also aware of her impulsiveness. Safety sitter at bedside. Will continue to repositioned patient. Will continue to monitor.   Sim BoastHavy, RN

## 2014-10-18 NOTE — Evaluation (Signed)
Clinical/Bedside Swallow Evaluation Patient Details  Name: Sharon [name] MRN: [id number] Date of Birth: 1946/06/14  Today's Date: 10/18/2014 Time: SLP Start Time (ACUTE ONLY): 1055 SLP Stop Time (ACUTE ONLY): 1122 SLP Time Calculation (min) (ACUTE ONLY): 27 min  Past Medical History:  Past Medical History  Diagnosis Date  . Bipolar 1 disorder   . Dementia   . Other pancytopenia 06/03/2014  . Stroke    Past Surgical History:  Past Surgical History  Procedure Laterality Date  . Cholecystectomy     HPI:  Pt is a 69 y/o female with PMH of advanced dementia and bipolar disorder who [facility] following a fall at home and seizure activity. Ct revealed hemorrhagic contusion in the R cerebellum. Pt has no prior hx of dysphagia.   Assessment / Plan / Recommendation Clinical Impression   Pt was seen for a swallow evaluation. Pt was lethargic and required max cues to participate during session. SLP provided hand over hand assist during PO trials. PO trials with thin liquids and purees did not elicit s/s of aspiration. Given pt's reduced level of alertness, recommend pt stay NPO until pt's mentation improves. Given pt's current mentation, pt unable to [facility] maintain appropriate level of nutrition. Recommend continued nutrition via alternate means. Hopeful pt will be able to begin oral intake without difficulty with improved mentation. Speech will follow-up with PO trials to continue to assess pt readiness for POs.     Aspiration Risk  Mild    Diet Recommendation NPO   Medication Administration: Via alternative means    Other  Recommendations     Follow Up Recommendations  Inpatient Rehab    Frequency and Duration min 2x/week  2 weeks   Pertinent Vitals/Pain     SLP Swallow Goals     Swallow Study Prior Functional Status  Cognitive/Linguistic Baseline: Baseline deficits    General HPI: Pt is a 69 y/o female with PMH of advanced dementia and bipolar disorder who [facility] following a fall at home and seizure activity. Ct revealed hemorrhagic contusion in the R cerebellum. Pt has no prior hx of dysphagia. Type of Study: Bedside swallow evaluation Respiratory Status: Nasal cannula History of Recent Intubation: No Behavior/Cognition: Requires cueing;Lethargic Oral Cavity - Dentition: Edentulous Self-Feeding Abilities: Needs assist Patient Positioning: Upright in bed Baseline Vocal Quality: Clear;Low vocal intensity Volitional Swallow: Unable to elicit    Oral/Motor/Sensory Function Overall Oral Motor/Sensory Function: Appears within functional limits for tasks assessed Labial ROM: Within Functional Limits Lingual ROM: Other (Comment) (Unable to assess) Facial ROM: Within Functional Limits   Ice Chips Ice chips: Within functional limits   Thin Liquid Thin Liquid: Within functional limits    Nectar Thick Nectar Thick Liquid: Not tested   Honey Thick Honey Thick Liquid: Not tested   Puree Puree: Within functional limits   Solid   GO    Solid: Not tested       Sharon [name] 10/18/2014,11:45 AM

## 2014-10-18 NOTE — Progress Notes (Signed)
Called 4N to give report. Nurse not available. Will call back in 10 min.

## 2014-10-18 NOTE — Evaluation (Signed)
TBI TEAM EVALUATION                             Precautions:   None  NA  ICP pressures NA  DNR yes  KI NA  Weightbearing NA  Sternal NA  Contact Precautions NA  Falls yes  Other: NA   Cause of injury: Sharon [name] is a 69 y.o. female suffered a fall and subsequent seizure.  She has a history of advanced dementia and bipolar disorder, apparently fell on some stairs.   Date of injury: 10/14/14  Medical complications: Pt had a seizure about 20min after fall, and another en route to the hospital. Pt sustained left frontal contusion, CT revealed hemorrhagic contusion in the R cerebellum and right convexity SDH.   Was patient intubated? no   Did loss of conscious occur? No, post ictal per chart   MRI: N/A CT: hemorrhagic contusion in the R cerebellum and right convexity SDH. Chest xray: Left base infiltrate. Lungs otherwise clear. No pneumothorax GCS score (initial and follow up): 13 score 3/24 date   Response to lifting of sedation: DATE3/26 Response Given for agitation, pt sedated        DATE3.28 Response no sedation, pt lethargic      Occupation: NA. Pt with dementia Primary Language: English  Pupil Appearance (size, shape) : N/T  Response to Sensory Testing: (for example: pinprick, temperature, noxious, visual, auditory olfactory) responds to verbal and tactile stimuli with localized response, follows commands             Reflexes: Check if present:  (chart only if present below)  None    grasp   snout   bite   Tongue thrust   sucking   rooting   Flexor withdrawal   Extensor thrust   palmonmental   babinski   Asymmetrical tonic neck reflex   glabellar    Additional Skilled Neurobehavioral observations:  No abnormalities observed    Decerebrate      Posturing

## 2014-10-18 NOTE — Progress Notes (Addendum)
INITIAL NUTRITION ASSESSMENT/CONSULT  DOCUMENTATION CODES Per approved criteria  -Not Applicable   INTERVENTION: Initiate TF via NGT with Jevity 1.2 at 25 ml/h and Prostat 30 ml BID on day 1; on day 2, increase to goal rate of 50 ml/h (1200 ml per day) and provide 30 ml Pro-stat once daily to provide 1540 kcals, 82 gm protein, 972 ml free water daily.   Diet advancement per MD   NUTRITION DIAGNOSIS: Inadequate oral intake related to patient being disoriented as evidenced by clear liquid diet (x 4 days).   Goal: Pt to meet >/= 90% of their estimated nutrition needs   Monitor:  PO intake, (?)TF initiation, weight, labs  Reason for Assessment: Malnutrition Screening Tool/ Consult for TF initiation and management  69 y.o. female  Admitting Dx: <principal problem not specified>  ASSESSMENT: 69 y.o. female seen in the ED after suffering a fall and subsequent SZ. She has a history of advanced dementia and bipolar disorder, apparently fell on some stairs. Per MD note, relatively severe TBI.   Patient has been on clear liquids for the past 4 days. Pt asleep at time of visit with safety mitts on. Per chart, pt remains disoriented and has been moaning and groaning. No evidence of weight loss per weight history. NGT in place at time of visit, unattached to suction or TF.    Labs reviewed.   Height: Ht Readings from Last 1 Encounters:  10/14/14  (1.626 m)    Weight: Wt Readings from Last 1 Encounters:  10/14/14 113 lb 12.1 oz (51.6 kg)    Ideal Body Weight: 120 lbs  % Ideal Body Weight: 94%  Wt Readings from Last 10 Encounters:  10/14/14 113 lb 12.1 oz (51.6 kg)  07/09/14 108 lb 6.4 oz (49.17 kg)  06/03/14 112 lb 4.8 oz (50.939 kg)    Usual Body Weight: unknown  % Usual Body Weight: NA  BMI:  Body mass index is 19.52 kg/(m^2).  Estimated Nutritional Needs: Kcal: 1350-1550 Protein: 65-80 grams Fluid: 1.5 L/day  Skin: intact  Diet Order: Diet clear liquid Room  service appropriate?: Yes; Fluid consistency:: Thin  EDUCATION NEEDS: -No education needs identified at this time   Intake/Output Summary (Last 24 hours) at 10/18/14 1037 Last data filed at 10/18/14 0800  Gross per 24 hour  Intake   1925 ml  Output      0 ml  Net   1925 ml    Last BM: 3/26   Labs:   Recent Labs Lab 10/14/14 1030  NA 138  K 3.8  CL 106  CO2 22  BUN 32*  CREATININE 0.86  CALCIUM 9.0  GLUCOSE 161*    CBG (last 3)   Recent Labs  10/16/14 0830 10/16/14 1201 10/16/14 1611  GLUCAP 138* 139* 121*    Scheduled Meds: . antiseptic oral rinse  7 mL Mouth Rinse BID  . cholecalciferol  1,000 Units Oral QHS  . citalopram  20 mg Oral QHS  . cyanocobalamin  1,000 mcg Oral QHS  . darifenacin  7.5 mg Oral Daily  . ferrous sulfate  325 mg Oral BID PC  . levETIRAcetam  500 mg Intravenous Q12H  . QUEtiapine  300 mg Oral BID  . sulfamethoxazole-trimethoprim  1 tablet Oral Q12H  . tamsulosin  0.4 mg Oral QHS  . zinc sulfate  220 mg Oral Daily    Continuous Infusions: . sodium chloride 0.9 % 1,000 mL infusion 75 mL/hr at 10/16/14 2152    Past Medical  History  Diagnosis Date  . Bipolar 1 disorder   . Dementia   . Other pancytopenia 06/03/2014  . Stroke     Past Surgical History  Procedure Laterality Date  . Cholecystectomy      Ian Malkineanne Barnett RD, LDN Inpatient Clinical Dietitian Pager: [phone] After Hours Pager: [phone]

## 2014-10-18 NOTE — Progress Notes (Signed)
Still waiting for feeding supplement to start feeding.  Sim BoastHavy, RN

## 2014-10-19 LAB — GLUCOSE, CAPILLARY
GLUCOSE-CAPILLARY: 177 mg/dL — AB (ref 70–99)
GLUCOSE-CAPILLARY: 183 mg/dL — AB (ref 70–99)
GLUCOSE-CAPILLARY: 141 mg/dL — AB (ref 70–99)
Glucose-Capillary: 165 mg/dL — ABNORMAL HIGH (ref 70–99)
Glucose-Capillary: 183 mg/dL — ABNORMAL HIGH (ref 70–99)
Glucose-Capillary: 207 mg/dL — ABNORMAL HIGH (ref 70–99)

## 2014-10-19 LAB — BASIC METABOLIC PANEL
Anion gap: 10 (ref 5–15)
BUN: 16 mg/dL (ref 6–23)
CALCIUM: 8.2 mg/dL — AB (ref 8.4–10.5)
CO2: 18 mmol/L — ABNORMAL LOW (ref 19–32)
Chloride: 118 mmol/L — ABNORMAL HIGH (ref 96–112)
Creatinine, Ser: 0.66 mg/dL (ref 0.50–1.10)
GFR calc non Af Amer: 87 mL/min — ABNORMAL LOW (ref >90)
GLUCOSE: 163 mg/dL — AB (ref 70–99)
POTASSIUM: 3.4 mmol/L — AB (ref 3.5–5.1)
Sodium: 146 mmol/L — ABNORMAL HIGH (ref 135–145)

## 2014-10-19 LAB — PHOSPHORUS: Phosphorus: 2.7 mg/dL (ref 2.3–4.6)

## 2014-10-19 LAB — CBC
HCT: 23.1 % — ABNORMAL LOW (ref 36.0–46.0)
HEMOGLOBIN: 7.9 g/dL — AB (ref 12.0–15.0)
MCH: 29.4 pg (ref 26.0–34.0)
MCHC: 34.2 g/dL (ref 30.0–36.0)
MCV: 85.9 fL (ref 78.0–100.0)
Platelets: 109 10*3/uL — ABNORMAL LOW (ref 150–400)
RBC: 2.69 MIL/uL — ABNORMAL LOW (ref 3.87–5.11)
RDW: 14.3 % (ref 11.5–15.5)
WBC: 3.4 10*3/uL — AB (ref 4.0–10.5)

## 2014-10-19 LAB — MAGNESIUM: Magnesium: 1.8 mg/dL (ref 1.5–2.5)

## 2014-10-19 MED ORDER — JEVITY 1.2 CAL PO LIQD
237.0000 mL | Freq: Every day | ORAL | Status: DC
  Administered 2014-10-19 – 2014-10-20 (×3): 237 mL

## 2014-10-19 MED ORDER — PRO-STAT SUGAR FREE PO LIQD
30.0000 mL | Freq: Every day | ORAL | Status: DC
  Administered 2014-10-20: 30 mL
  Filled 2014-10-19 (×2): qty 30

## 2014-10-19 MED ORDER — POTASSIUM CHLORIDE 20 MEQ/15ML (10%) PO SOLN
40.0000 meq | Freq: Once | ORAL | Status: AC
  Administered 2014-10-19: 40 meq
  Filled 2014-10-19: qty 30

## 2014-10-19 NOTE — Progress Notes (Deleted)
CARE MANAGEMENT NOTE 10/19/2014  Patient:  Sharon [name],Sharon [name]   Account Number:  [id number]  Date Initiated:    Documentation initiated by:    Subjective/Objective Assessment:     Action/Plan:   Anticipated DC Date:     Anticipated DC Plan:           Choice offered to / List presented to:             Status of service:   Medicare Important Message given?  YES (If response is "NO", the following Medicare IM given date fields will be blank) Date Medicare IM given:  10/19/2014 Medicare IM given by:  Lawerance SabalSWIST,DEBBIE Date Additional Medicare IM given:   Additional Medicare IM given by:    Discharge Disposition:    Per UR Regulation:    If discussed at Long Length of Stay Meetings, dates discussed:    Comments:  10/19/14 10:45 IM letter provided. Lawerance Sabalebbie Swist RN BSN CM

## 2014-10-19 NOTE — Progress Notes (Signed)
   Name: Sharon [name] MRN: [id number] DOB: [birth year]    ADMISSION DATE:  10/14/2014 CONSULTATION DATE:  10/19/2014   REFERRING MD :  Conchita ParisNundkumar  CHIEF COMPLAINT:  fall  BRIEF PATIENT DESCRIPTION: 69 y.o with bipolar x 40 urs & dementia x [age]yr adm 3/24 s/p fall in garage. Head CT showed skull fractures, left frontal & temporal contusion & occipital ICH, extensive SAh & mild SDH - 4mm midline shift. No cervical injury was noted. She was admitted for observation.  Did not clear swallow eval, PCCM consulted 3/28 for med mx &  PVCs on monitor  SIGNIFICANT EVENTS    STUDIES:  Head CT 3/25 Newly seen hemorrhagic contusion in the right cerebellum. No change in right-sided skullbase and skull fractures.  No appreciable change in hemorrhagic contusion most pronounced in the left frontal lobe but also affecting the right frontal lobe and the left temporal tip. Subarachnoid hemorrhage remains visible, left more than right. 3 mm left-to-right shift, not significantly changed.  SUBJECTIVE:  Some garbled speech, moving bed Failed swallowing eval  VITAL SIGNS: Temp:  [97 F (36.1 C)-98.8 F (37.1 C)] 97.6 F (36.4 C) (03/29 0622) Pulse Rate:  [50-102] 98 (03/29 0622) Resp:  [19-27] 20 (03/29 0622) BP: (87-161)/(52-84) 133/84 mmHg (03/29 0622) SpO2:  [91 %-99 %] 91 % (03/29 0622) Weight:  [117 lb 8.1 oz (53.3 kg)-121 lb (54.885 kg)] 117 lb 8.1 oz (53.3 kg) (03/29 0622)  PHYSICAL EXAMINATION: General:  Chronic  ill appearing, in mitts, agitated at times,  Neuro:  RASS -1, sleeping, moves spont, some garbled speech HEENT:  Some agitated tongue movements, OP clear Cardiovascular:  s1s2 nml Lungs:  clear Musculoskeletal:  No edema Skin:  No rash   Recent Labs Lab 10/14/14 1030 10/18/14 1141 10/19/14 0140  NA 138 147* 146*  K 3.8 3.4* 3.4*  CL 106 119* 118*  CO2 22 18* 18*  BUN 32* 16 16  CREATININE 0.86 0.69 0.66  GLUCOSE 161* 135* 163*    Recent Labs Lab 10/14/14 1030  10/18/14 1141 10/19/14 0140  HGB 11.3* 8.2* 7.9*  HCT 34.0* 24.5* 23.1*  WBC 7.6 2.8* 3.4*  PLT 136* 96* 109*   No results found.  ASSESSMENT / PLAN:  Acute encephalopathy TBI with skull fracture, brain contusion, ICH Baseline dementia PVCs   Agreed to DNR status and conservative care Anticipate that she will recover but is not safe anymore at home. Agree with efforts to secure SNF placement Lytes correct as needed Continue TFs, keep npo until cleared by speech therapy PCCM will sign off. Please call us if we can help    [facility] Minor ACNP [facility] PCCM Pager [phone] till 3 pm If no answer page [phone] 10/19/2014, 9:57 AM  Attending Note:  I have examined patient, reviewed labs, studies and notes. I have discussed the case with S Minor, and I agree with the data and plans as amended above.   Levy Pupaobert Dannika Hilgeman, MD, PhD 10/19/2014, 3:42 PM  Pulmonary and Critical Care [phone] or if no answer [phone]

## 2014-10-19 NOTE — Progress Notes (Signed)
Speech Language Pathology Treatment: Cognitive-Linquistic  Patient Details Name: Sharon [name] MRN: [id number] DOB: 1946-04-08 Today's Date: 10/19/2014 Time: 1150-1205 SLP Time Calculation (min) (ACUTE ONLY): 15 min  Assessment / Plan / Recommendation Clinical Impression  Pt seen for follow-up for dysphagia and cognitive tx. Pt continues to be lethargic with poor participation despite max verbal and tactile cues. Pt continues to demonstrate behavior consistent with Rancho III (Localized Response) depending on level of arousal and stimuli. SLP sat pt up in bed and used rocking motion to facilitate arousal. SLP also used pt's family as therapeutic agents. PO trials were not attempted given pt's mentation and inability to participate. Recommend pt stay NPO until mentation improves. Speech will follow-up with continued PO trials to determine readiness for oral intake and cognitive tx.    HPI HPI: Pt is a 69 y/o female with PMH of advanced dementia and bipolar disorder who [facility] a fall at home and seizure activity. Ct revealed hemorrhagic contusion in the R cerebellum. Pt has no prior hx of dysphagia.   Pertinent Vitals    SLP Plan  Continue with current plan of care    Recommendations Diet recommendations: NPO Medication Administration: Via alternative means              Plan: Continue with current plan of care    GO     Bermudez-Bosch, Arafat Cocuzza 10/19/2014, 1:39 PM

## 2014-10-19 NOTE — Progress Notes (Signed)
CARE MANAGEMENT NOTE 10/19/2014  Patient:  Sharon [name],Sharon [name]   Account Number:  [id number]  Date Initiated:    Documentation initiated by:    Subjective/Objective Assessment:     Action/Plan:   Anticipated DC Date:     Anticipated DC Plan:           Choice offered to / List presented to:             Status of service:   Medicare Important Message given?  YES (If response is "NO", the following Medicare IM given date fields will be blank) Date Medicare IM given:  10/19/2014 Medicare IM given by:  SWIST,DEBBIE Date Additional Medicare IM given:   Additional Medicare IM given by:    Discharge Disposition:    Per UR Regulation:    If discussed at Long Length of Stay Meetings, dates discussed:    Comments:  10/19/14 10:45 IM letter provided. Debbie Swist RN BSN CM   

## 2014-10-19 NOTE — Progress Notes (Signed)
BG this AM at 177.

## 2014-10-19 NOTE — Clinical Social Work Psychosocial (Addendum)
Clinical Social Work Department BRIEF PSYCHOSOCIAL ASSESSMENT 10/19/2014  Patient:  Sharon [name], Sharon [name]     Account Number:  [id number]     Admit date:  10/14/2014  Clinical Social Worker:  Marciano Sequin  Date/Time:  10/19/2014 11:32 AM  Referred by:  RN  Date Referred:  10/19/2014 Referred for  SNF Placement   Other Referral:   Interview type:  Family Other interview type:   pt is disoriented; pt's husband Sharon [name] [phone]    PSYCHOSOCIAL DATA Living Status:  HUSBAND Admitted from facility:   Level of care:   Primary support name:  Sharon [name] Primary support relationship to patient:  SPOUSE Degree of support available:   Strong Support System    CURRENT CONCERNS Current Concerns  None Noted   Other Concerns:    SOCIAL WORK ASSESSMENT / PLAN CSW met the pt's husband Sharon [name] at the bedside.  CSW introduced self and purpose of the visit. CSW discussed clinical recommendation for SNF rehab. CSW inquired about the geographical location in which Sharon [name] would like the pt to receive rehab from. CSW explained the SNF rehab process to the [place]. CSW and Sharon [name] discussed insurance and its relation to SNF rehab. CSW answered all questions in which the [place] inquired about. CSW provided Sharon [name] with contact information for further questions. CSW will continue to follow this pt and assist with discharge as needed.   Assessment/plan status:  Psychosocial Support/Ongoing Assessment of Needs Other assessment/ plan:   Information/referral to community resources:    PATIENT'S/FAMILY'S RESPONSE TO CURRENT DIAGNOSE: Sharon [name] presented with a calm mood and normal affect. Sharon [name] provided great detail and insight into the pt's condition. Sharon [name] informed the CSW that he has been taking care of the pt at home for 5 years with her current condition. Sharon [name] reported the pt has suffered from Bipolar Disorder for 30 plus years. Sharon [name] reported understanding the pt's dementia is advancing and a long-term care  plan will be needed.   PATIENT'S/FAMILY'S RESPONSE TO PLAN OF CARE: Sharon [name]  prefers [facility] for SNF rehab.    [place], MSW, [place]

## 2014-10-19 NOTE — Progress Notes (Signed)
Patient appears more lethargic than on admission to floor yesterday. Per husband, he felt that her conditionis worsening prior to arrival and from yesterday. MD paged regarding patient's status. Respiratory at bedside at this time.  Awaiting for call back.   Sim BoastHavy, RN

## 2014-10-19 NOTE — Clinical Social Work Placement (Signed)
Clinical Social Work Department CLINICAL SOCIAL WORK PLACEMENT NOTE 10/19/2014  Patient:  Sharon [name],Sharon [name]  Account Number:  [id number] Admit date:  10/14/2014  Clinical Social Worker:  Ashok CordiaYSHEKA Stephie Xu, LCSWA  Date/time:  10/19/2014 11:40 AM  Clinical Social Work is seeking post-discharge placement for this patient at the following level of care:   SKILLED NURSING   (*CSW will update this form in Epic as items are completed)   10/19/2014  Patient/family provided with [facility] Department of Clinical Social Work's list of facilities offering this level of care within the geographic area requested by the patient (or if unable, by the patient's family).  10/19/2014  Patient/family informed of their freedom to choose among providers that offer the needed level of care, that participate in Medicare, Medicaid or managed care program needed by the patient, have an available bed and are willing to accept the patient.  10/19/2014  Patient/family informed of [facility]' ownership interest in [facility], as well as of the fact that they are under no obligation to receive care at this facility.  PASARR submitted to EDS on 10/19/2014 PASARR number received on   FL2 transmitted to all facilities in geographic area requested by pt/family on  10/19/2014 FL2 transmitted to all facilities within larger geographic area on 10/19/2014  Patient informed that his/her managed care company has contracts with or will negotiate with  certain facilities, including the following:     Patient/family informed of bed offers received:   Patient chooses bed at  Physician recommends and patient chooses bed at    Patient to be transferred to  on   Patient to be transferred to facility by  Patient and family notified of transfer on  Name of family member notified:    The following physician request were entered in Epic:   Additional Comments:  Kristell Wooding, MSW, LCSWA [phone]

## 2014-10-19 NOTE — Clinical Documentation Improvement (Signed)
Abnormal diagnostic findings (MRI scans, CT scans, etc.) are not coded and reported unless the physician indicates their clinical significance. If possible, please help by clarifying the diagnostic and/or clinical significance of the abnormal diagnostic study for this patient.  Cerebral Edema ? Cytotoxic Edema ? Subfalcine Herniation ? Uncal/Transtentorial Herniation ? Vasogenic Edema ? Other brain herniation (please specify) _______________ ? Other cause (please specify) _______________ ? Unable to determine ? Unknown     Diagnostics: 3/24: CT head Wo contrast:  There is edema in the inferior left frontal lobe with a 2.6 x 3 x 2 cm (8cc volume). Edema also present in the left temporal pole, consistent with nonhemorrhagic contusion. Brain swelling results in 4 mm rightward shift.      Thank you,  Sharon [name] ,RN Clinical Documentation Specialist:  [phone]  [facility]- Health Information Management

## 2014-10-19 NOTE — Progress Notes (Addendum)
NUTRITION FOLLOW UP  Intervention:   Change from continuous to bolus TF: Provide 1 can of Jevity 1.2 5 times daily ( recommend 8 AM, 11 AM, 2 PM, 5 PM, and 8 PM) Continue 30 ml pro-stat once daily (TF regimen provides 1540 kcals, 82 gm protein, 972 ml free water daily)  Monitor diet advancement  Nutrition Dx:   Inadequate oral intake related to patient being disoriented as evidenced by clear liquid diet (x 4 days); ongoing, now NPO  Goal:   Pt to meet >/= 90% of their estimated nutrition needs; being met  Monitor:   TF initiation/tolerance, weight, labs  Assessment:   69 y.o. female seen in the ED after suffering a fall and subsequent SZ. She has a history of advanced dementia and bipolar disorder, apparently fell on some stairs. Per MD note, relatively severe TBI.   Family meeting with MD in pt room at time of visit. Patient was on clear liquids for 4 days until NGT was placed and TF were started yesterday. Pt is now NPO and receiving tube feedings via NGT with Jevity 1.2 running at 50 ml/hr. Per  Chart pt is tolerating TF; 0 ml residuals. RN reports that there is concern for aspiration as pt refuses to keep her head elevated and has been laying on her belly; bolus feeds will decrease feeding time and will allow pt to be unattached from pump.   Labs: elevated glucose, low potassium, low hemoglobin, low calcium, high sodium; phosphorus and magnesium are WNL   Height: Ht Readings from Last 1 Encounters:  10/14/14 5' 4"  (1.626 m)    Weight Status:   Wt Readings from Last 1 Encounters:  10/19/14 117 lb 8.1 oz (53.3 kg)    Re-estimated needs:  Kcal: 1350-1550 Protein: 65-80 grams Fluid: 1.5 L/day  Skin:  intact  Diet Order:   NPO   Intake/Output Summary (Last 24 hours) at 10/19/14 1634 Last data filed at 10/19/14 1404  Gross per 24 hour  Intake 3451.25 ml  Output      0 ml  Net 3451.25 ml    Last BM: 3/26   Labs:   Recent Labs Lab 10/14/14 1030 10/18/14 1141  10/18/14 1230 10/18/14 1459 10/19/14 0140  NA 138 147*  --   --  146*  K 3.8 3.4*  --   --  3.4*  CL 106 119*  --   --  118*  CO2 22 18*  --   --  18*  BUN 32* 16  --   --  16  CREATININE 0.86 0.69  --   --  0.66  CALCIUM 9.0 8.1*  --   --  8.2*  MG  --   --  1.9 1.9 1.8  PHOS  --   --   --  3.3 2.7  GLUCOSE 161* 135*  --   --  163*    CBG (last 3)   Recent Labs  10/19/14 0410 10/19/14 0743 10/19/14 1151  GLUCAP 183* 177* 207*    Scheduled Meds: . antiseptic oral rinse  7 mL Mouth Rinse BID  . cholecalciferol  1,000 Units Oral QHS  . citalopram  20 mg Oral QHS  . cyanocobalamin  1,000 mcg Oral QHS  . darifenacin  7.5 mg Oral Daily  . feeding supplement (PRO-STAT SUGAR FREE 64)  30 mL Per Tube Daily  . ferrous sulfate  325 mg Oral BID PC  . free water  200 mL Per Tube 3 times per day  .  ipratropium-albuterol  3 mL Nebulization Q6H  . levETIRAcetam  500 mg Intravenous Q12H  . pantoprazole sodium  40 mg Per Tube Q1200  . QUEtiapine  300 mg Oral BID  . sulfamethoxazole-trimethoprim  1 tablet Oral Q12H  . tamsulosin  0.4 mg Oral QHS  . zinc sulfate  220 mg Oral Daily    Continuous Infusions: . feeding supplement (JEVITY 1.2 CAL) 50 mL/hr (10/19/14 0805)  . sodium chloride 0.9 % 1,000 mL infusion 50 mL/hr at 10/19/14 1003    Pryor Ochoa RD, LDN Inpatient Clinical Dietitian Pager: [phone] After Hours Pager: [phone]

## 2014-10-20 ENCOUNTER — Inpatient Hospital Stay (HOSPITAL_COMMUNITY): Payer: Medicare Other

## 2014-10-20 DIAGNOSIS — J810 Acute pulmonary edema: Secondary | ICD-10-CM

## 2014-10-20 DIAGNOSIS — R0902 Hypoxemia: Secondary | ICD-10-CM

## 2014-10-20 DIAGNOSIS — I619 Nontraumatic intracerebral hemorrhage, unspecified: Secondary | ICD-10-CM | POA: Diagnosis present

## 2014-10-20 LAB — PHOSPHORUS: Phosphorus: 2.7 mg/dL (ref 2.3–4.6)

## 2014-10-20 LAB — GLUCOSE, CAPILLARY
GLUCOSE-CAPILLARY: 154 mg/dL — AB (ref 70–99)
GLUCOSE-CAPILLARY: 156 mg/dL — AB (ref 70–99)
GLUCOSE-CAPILLARY: 173 mg/dL — AB (ref 70–99)
GLUCOSE-CAPILLARY: 218 mg/dL — AB (ref 70–99)
Glucose-Capillary: 173 mg/dL — ABNORMAL HIGH (ref 70–99)
Glucose-Capillary: 174 mg/dL — ABNORMAL HIGH (ref 70–99)
Glucose-Capillary: 157 mg/dL — ABNORMAL HIGH (ref 70–99)

## 2014-10-20 LAB — CULTURE, BLOOD (ROUTINE X 2)
CULTURE: NO GROWTH
Culture: NO GROWTH

## 2014-10-20 LAB — BASIC METABOLIC PANEL
Anion gap: 7 (ref 5–15)
BUN: 14 mg/dL (ref 6–23)
CO2: 22 mmol/L (ref 19–32)
Calcium: 8.2 mg/dL — ABNORMAL LOW (ref 8.4–10.5)
Chloride: 117 mmol/L — ABNORMAL HIGH (ref 96–112)
Creatinine, Ser: 0.57 mg/dL (ref 0.50–1.10)
GFR calc Af Amer: >90 mL/min (ref >90)
GLUCOSE: 138 mg/dL — AB (ref 70–99)
Potassium: 3.9 mmol/L (ref 3.5–5.1)
SODIUM: 146 mmol/L — AB (ref 135–145)

## 2014-10-20 LAB — CBC
HEMATOCRIT: 24.6 % — AB (ref 36.0–46.0)
Hemoglobin: 8.2 g/dL — ABNORMAL LOW (ref 12.0–15.0)
MCH: 28.9 pg (ref 26.0–34.0)
MCHC: 33.3 g/dL (ref 30.0–36.0)
MCV: 86.6 fL (ref 78.0–100.0)
Platelets: 175 10*3/uL (ref 150–400)
RBC: 2.84 MIL/uL — AB (ref 3.87–5.11)
RDW: 14.8 % (ref 11.5–15.5)
WBC: 6.7 10*3/uL (ref 4.0–10.5)

## 2014-10-20 LAB — MAGNESIUM: Magnesium: 1.8 mg/dL (ref 1.5–2.5)

## 2014-10-20 MED ORDER — LEVETIRACETAM 100 MG/ML PO SOLN
500.0000 mg | Freq: Two times a day (BID) | ORAL | Status: DC
  Administered 2014-10-20: 500 mg
  Filled 2014-10-20 (×3): qty 5

## 2014-10-20 MED ORDER — FERROUS SULFATE 300 (60 FE) MG/5ML PO SYRP
300.0000 mg | ORAL_SOLUTION | Freq: Two times a day (BID) | ORAL | Status: DC
  Administered 2014-10-20 – 2014-10-25 (×9): 300 mg via ORAL
  Filled 2014-10-20 (×13): qty 5

## 2014-10-20 MED ORDER — ENSURE ENLIVE PO LIQD
237.0000 mL | Freq: Two times a day (BID) | ORAL | Status: DC
  Administered 2014-10-22 – 2014-10-25 (×6): 237 mL via ORAL

## 2014-10-20 MED ORDER — FUROSEMIDE 10 MG/ML IJ SOLN
20.0000 mg | Freq: Once | INTRAMUSCULAR | Status: AC
  Administered 2014-10-20: 20 mg via INTRAVENOUS
  Filled 2014-10-20: qty 2

## 2014-10-20 MED ORDER — ALBUTEROL SULFATE (2.5 MG/3ML) 0.083% IN NEBU: 2.5000 mg | INHALATION_SOLUTION | RESPIRATORY_TRACT | Status: DC

## 2014-10-20 MED ORDER — LEVETIRACETAM 500 MG/5ML IV SOLN
500.0000 mg | Freq: Two times a day (BID) | INTRAVENOUS | Status: DC
  Administered 2014-10-20 – 2014-10-21 (×3): 500 mg via INTRAVENOUS
  Filled 2014-10-20 (×6): qty 5

## 2014-10-20 NOTE — Progress Notes (Signed)
Given report on patient and transfer over to 3 MW(per request of Elsner MD) by this Clinical research associatewriter and Sport and exercise psychologisthil RN.

## 2014-10-20 NOTE — Progress Notes (Signed)
Occupational Therapy Evaluation Patient Details Name: Sharon [name] MRN: [id number] DOB: 09/03/1945 Today's Date: 10/20/2014    History of Present Illness 69 y.o. female s/p fall with left frontal, L temporal bleed &R cerebellar contusion, R basal skull fxs,  and post traumatic seizures; positive UTI. Hx of Bipolar and dementia.   Clinical Impression   PTA, pt able to complete ADL with S and was independent with mobility. Per husband, pt had a "movement disorder - arms/legs moving involuntarily at times" PTA. Pt currently requires Max to total A with ADL and mobility and presents as a Rancho level IV with emerging behaviors consistent with level V. Given pt's history of bipolar and dementia, feel pt will benefit from rehab @ SNF. Will follow acutely to facilitate D/C and maximize functional level of independence.     Follow Up Recommendations  SNF;Supervision/Assistance - 24 hour    Equipment Recommendations  Other (comment) (TBA at SNF)    Recommendations for Other Services       Precautions / Restrictions Precautions Precautions: Fall Precaution Comments: apparent movemetn disorder PTA      Mobility Bed Mobility Overal bed mobility: +2 for physical assistance;Needs Assistance Bed Mobility: Supine to Sit;Sit to Supine     Supine to sit: +2 for physical assistance;Max assist Sit to supine: +2 for physical assistance;Mod assist   General bed mobility comments: Pt initiating rolling and movements to get to edge of bed. Pt initiating movements to get back into bed by reaching for bed and lifting BLE onto bed  Transfers Overall transfer level: Needs assistance   Transfers: Sit to/from Stand Sit to Stand: +2 safety/equipment;Mod assist         General transfer comment: Mod A to stand. +2 helpful to maintain static standing. Decreased postural control. however, pt able to maintain midline postural control for @ 1 min    Balance Overall balance assessment: Needs  assistance   Sitting balance-Leahy Scale: Poor Sitting balance - Comments: able to sit edge of bed with min to max a at times. Pt also demosntrates dysmetria. ? if related to history of Bipolar meds. Family states she has baseline movement disorder. Postural control: Posterior lean (difficulty with control of movement) Standing balance support: Bilateral upper extremity supported Standing balance-Leahy Scale: Poor Standing balance comment: affected by decreased attention also                            ADL Overall ADL's : Needs assistance/impaired Eating/Feeding: Maximal assistance Eating/Feeding Details (indicate cue type and reason): being assessed by ST this visit. Pt holding cup and spoon to asssit with feeding. Affected by lethargy. Grooming: Therapist, nutritional;Moderate assistance Grooming Details (indicate cue type and reason): able to wipe mouth after tactile cues to initiate at times; trying to use both hands to appropriately wipe eyes                             Functional mobility during ADLs: +2 for physical assistance;Moderate assistance General ADL Comments: Max A at this time for ADL. Following commands with increased time. Mod A at times to maintain trunk control due to attention, lethargy and apparent movement disorder.Pt answering questions appropriately at times. Able to state her name and that she was in a hospital. Pt answering yes/no. Difficulty maintaining level of alertness.      Vision Additional Comments: Prefers R gaze but will track into L field.  Will further assess   Perception Perception Comments: will further assess   Praxis Praxis Praxis tested?: Deficits Deficits: Initiation;Motor Impersistence    Pertinent Vitals/Pain Pain Assessment: Faces Faces Pain Scale: Hurts little more Pain Location: general discomfort Pain Descriptors / Indicators: Grimacing Pain Intervention(s): Limited activity within patient's tolerance     Hand  Dominance Right   Extremity/Trunk Assessment Upper Extremity Assessment Upper Extremity Assessment: LUE deficits/detail LUE Deficits / Details: spontaneous movement. appesr to demonstrte greater proximal movemetn than distal movement. gross grasp/release LUE Coordination: decreased fine motor;decreased gross motor   Lower Extremity Assessment Lower Extremity Assessment: Difficult to assess due to impaired cognition;LLE deficits/detail LLE Deficits / Details: Pt able to weight bear through LLE. Appears to demonstrate motor impersistance. Demonstrates active spontaneous movement.   Cervical / Trunk Assessment Cervical / Trunk Assessment: Other exceptions Cervical / Trunk Exceptions: poor trunk control   Communication Communication Communication: Expressive difficulties   Cognition Arousal/Alertness: Lethargic Behavior During Therapy: Restless Overall Cognitive Status: Impaired/Different from baseline Area of Impairment: Orientation;Attention;Memory;Following commands;Safety/judgement;Awareness;Problem solving;Rancho level Orientation Level: Disoriented to;Situation;Time (pt states she is in the hospital) Current Attention Level: Focused Memory: Decreased short-term memory;Decreased recall of precautions Following Commands: Follows one step commands with increased time Safety/Judgement: Decreased awareness of deficits Awareness: Intellectual Problem Solving: Slow processing;Decreased initiation;Difficulty sequencing;Requires verbal cues;Requires tactile cues General Comments: more alert this session. following commands more consitently. purposeful behavior of wiping face, holding cup, showed 2 fingers, thumbs up on command   General Comments                    Home Living Family/patient expects to be discharged to:: Skilled nursing facility                                        Prior Functioning/Environment Level of Independence: Needs assistance  Gait /  Transfers Assistance Needed: independent ADL's / Homemaking Assistance Needed: Husband provided S for ADL. Husband states wife spent alot of time "rocking" in a chair./ had nights/days confused at baseline   Comments: Husband states pt had "movement problems" PTA. ? dyskinesia    OT Diagnosis: Generalized weakness;Cognitive deficits;Disturbance of vision;Other (comment) (movement disorder)   OT Problem List: Decreased strength;Decreased range of motion;Decreased activity tolerance;Impaired balance (sitting and/or standing);Impaired vision/perception;Decreased coordination;Decreased cognition;Decreased safety awareness;Decreased knowledge of use of DME or AE;Decreased knowledge of precautions;Impaired tone;Impaired UE functional use   OT Treatment/Interventions: Self-care/ADL training;Therapeutic exercise;Neuromuscular education;DME and/or AE instruction;Therapeutic activities;Cognitive remediation/compensation;Visual/perceptual remediation/compensation;Patient/family education;Balance training    OT Goals(Current goals can be found in the care plan section) Acute Rehab OT Goals Patient Stated Goal: unable to state OT Goal Formulation: With family Time For Goal Achievement: 11/03/14 Potential to Achieve Goals: Good  OT Frequency: Min 2X/week   Barriers to D/C:            Co-evaluation PT/OT/SLP Co-Evaluation/Treatment: Yes Reason for Co-Treatment: Complexity of the patient's impairments (multi-system involvement);Necessary to address cognition/behavior during functional activity;For patient/therapist safety   OT goals addressed during session: ADL's and self-care;Strengthening/ROM      End of Session Equipment Utilized During Treatment: Oxygen Nurse Communication: Mobility status  Activity Tolerance: Patient tolerated treatment well Patient left: in bed;with call bell/phone within reach;with bed alarm set;with family/visitor present   Time: 1021-1050 OT Time Calculation (min):  29 min Charges:  OT General Charges $OT Visit: 1 Procedure OT Evaluation $Initial OT Evaluation Tier I: 1 Procedure G-Codes:  Merland Holness,HILLARY 10/20/2014, 1:51 PM   [facility], OTR/L  [phone] 10/20/2014

## 2014-10-20 NOTE — Progress Notes (Addendum)
Upon report with previous nurse, noted that patient was having difficulty breathing, increased oxygen demand from 2L (88%) to 6L (94). Placed continuous monitoring on the patient. Repositioned patient. Notified Charge Nurse, Respiratory Therapist, and on call physician.   Rapid Response notified to assess patient. Notified by Danielle DessElsner MD to transfer patient over to 3 MW. Notified Rapid Response of Elsner MD decision. Notified family of decision.

## 2014-10-20 NOTE — Progress Notes (Signed)
Name: Sharon [name] MRN: [id number] DOB: 03-31-1946    ADMISSION DATE:  10/14/2014 CONSULTATION DATE:  10/20/2014   REFERRING MD :  Conchita Paris  CHIEF COMPLAINT:  fall  BRIEF PATIENT DESCRIPTION: 69 y.o with bipolar x 40 urs & dementia x 31yr adm 3/24 s/p fall in garage. Head CT showed skull fractures, left frontal & temporal contusion & occipital ICH, extensive SAh & mild SDH - 4mm midline shift. No cervical injury was noted. She was admitted for observation. Did not clear swallow eval, PCCM consulted 3/28 for med mx &  PVCs on monitor  SIGNIFICANT EVENTS  3/24 > admitted s/p fall in garage with left frontal contusion and post traumatic SZ. Monitored in ICU 3/28 > family discussion > DNR, transferred to SDU 3/30 resp distress, to ICU  STUDIES:  Head CT 3/25> Newly seen hemorrhagic contusion in the right cerebellum. No change in right-sided skullbase and skull fractures. No appreciable change in hemorrhagic contusion most pronounced in the left frontal lobe but also affecting the right frontal lobe and the left temporal tip. Subarachnoid hemorrhage remains visible, left more than right. 3 mm left-to-right shift, not significantly changed.  SUBJECTIVE:  RN noted increased WOB, and called attending who initiated ICU transfer and PCCM consultation. Of note she was started on diet today. Has passed SLP eval but RN's not confident she is swallowing OK, enteral intake has been minimal.  VITAL SIGNS: Temp:  [98.2 F (36.8 C)-99.7 F (37.6 C)] 98.8 F (37.1 C) (03/30 0932) Pulse Rate:  [96-103] 96 (03/30 0932) Resp:  [16-20] 20 (03/30 0932) BP: (142-157)/(60-82) 142/60 mmHg (03/30 0932) SpO2:  [85 %-96 %] 94 % (03/30 2012) Weight:  [50 kg (110 lb 3.7 oz)] 50 kg (110 lb 3.7 oz) (03/30 0500)  PHYSICAL EXAMINATION: General:  Chronic  ill appearing, in mitts, agitated at times,  Neuro:  RASS -1, sleeping, moves spont, some garbled speech HEENT:  Some agitated tongue movements, OP  clear Cardiovascular:  s1s2 nml Lungs:  Slight exp wheeze, otherwise clear Musculoskeletal:  No edema Skin:  No rash   Recent Labs Lab 10/18/14 1141 10/19/14 0140 10/20/14 0550  NA 147* 146* 146*  K 3.4* 3.4* 3.9  CL 119* 118* 117*  CO2 18* 18* 22  BUN CREATININE 0.69 0.66 0.57  GLUCOSE 135* 163* 138*    Recent Labs Lab 10/18/14 1141 10/19/14 0140 10/20/14 0550  HGB 8.2* 7.9* 8.2*  HCT 24.5* 23.1* 24.6*  WBC 2.8* 3.4* 6.7  PLT 96* 109* 175   Dg Chest Port 1 View  10/20/2014   CLINICAL DATA:  A vomiting, pancytopenia, history of CVA, possible pneumonia.  EXAM: PORTABLE CHEST - 1 VIEW  COMPARISON:  None.  FINDINGS: The lungs are well-expanded. The interstitial markings are increased diffusely. There are bilateral pleural effusions and a partially included in the field of view. The cardiac silhouette is enlarged. The pulmonary vascularity is indistinct. The bones are osteopenic.  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 Bibasilar atelectasis and/or small pleural effusions are present.  When the patient can tolerate the procedure, a PA and lateral chest x-ray would be useful.   Electronically Signed   By: David  [place]   On: 10/20/2014 15:52    ASSESSMENT / PLAN:  Acute hypoxic respiratory failure, suspect 2nd to pulmonary edema ? Aspiration event Dysphagia - Supplemental O2 to maintain SpO2 > 92% - Continue nebs - Diurese, Lasix  x1 - KVO IVF - Inset foley -  Strict I&O - Repeat CXR in AM - Follow renal function & correct lytes as indicated. - Echo in am - NPO - Resume TF in AM suspect will need PEG  TBI with skull fracture, brain contusion, ICH Baseline dementia Acute encephalopathy Agitation - management per neurology - continue low dose ativan PRN - Keppra to IV - Holding other PO meds   Joneen Roachaul Hoffman, AGACNP-BC [facility] Pager [phone] or [phone] 10/20/2014 9:02  PM   Attending Note:  I have examined patient, reviewed labs, studies and notes. I have discussed the case with Henreitta LeberP Hoffman, and I agree with the data and plans as amended above. Will follow her respiratory status after lasix.   Levy Pupaobert Revere Maahs, MD, PhD 10/21/2014, 12:28 AM [facility] [phone] or if no answer [phone]

## 2014-10-20 NOTE — Progress Notes (Signed)
Speech Language Pathology Treatment: Dysphagia;Cognitive-Linquistic  Patient Details Name: Sharon [name] MRN: [id number] DOB: 17-Jun-1946 Today's Date: 10/20/2014 Time: 1012-1050 SLP Time Calculation (min) (ACUTE ONLY): 38 min  Assessment / Plan / Recommendation Clinical Impression  Pt seen for follow-up for dysphagia and cognitive treatment. Pt was more alert and cooperative that last seen in prior session, while sitting in bed with PT and OT. Pt demonstrated behaviors consistent with Rancho IV (confused, agitated) depending on the level of arousal and stimuli when provided max verbal contextual cues. SLP provided PO trials of thin liquids and purees with hand over hand assist with no overt s/s of aspiration. Recommend removal of NG tube and initiation of dys 1/ thin liquid diet. SLP spoke with Dr. Conchita ParisNundkumar who approved NG tube removal. Pt should only eat when fully alert, sitting up and able to participate. Speech will follow-up with diet tolerance, possible upgrade and cognitive treatment.    HPI HPI: Pt is a 69 y/o female with PMH of advanced dementia and bipolar disorder [facility] a fall at home and seizure activity. Ct revealed hemorrhagic contusion in the R cerebellum. Pt has no prior hx of dysphagia.   Pertinent Vitals Pain Assessment: Faces Faces Pain Scale: Hurts little more  SLP Plan  Continue with current plan of care    Recommendations Diet recommendations: Thin liquid;Dysphagia 1 (puree) Liquids provided via: Cup Medication Administration: Via alternative means Supervision: Full supervision/cueing for compensatory strategies Compensations: Small sips/bites;Slow rate Postural Changes and/or Swallow Maneuvers: Seated upright 90 degrees              General recommendations: Rehab consult Oral Care Recommendations: Oral care Q4 per protocol Follow up Recommendations: Inpatient Rehab Plan: Continue with current plan of care    GO     Bermudez-Bosch,  Taesha Goodell 10/20/2014, 10:57 AM

## 2014-10-20 NOTE — Progress Notes (Signed)
NUTRITION FOLLOW UP  Intervention:   Provide Magic cup BID with meals, each supplement provides 290 kcal and 9 grams of protein Provide Ensure Enlive BID, each supplement provides 350 kcal and 20 grams of protein  RD will monitor for PO adequacy   Nutrition Dx:   Inadequate oral intake related to patient being disoriented as evidenced by clear liquid diet (x 4 days); ongoing/improving  Goal:   Pt to meet >/= 90% of their estimated nutrition needs;  progressing  Monitor:   TF initiation/tolerance, weight, labs, PO intake/adequacy  Assessment:   69 y.o. female seen in the ED after suffering a fall and subsequent SZ. She has a history of advanced dementia and bipolar disorder, apparently fell on some stairs. Per MD note, relatively severe TBI.   Pt out of room at time of visit. Pt was re-assess by SLP this morning and diet has been advanced to Dysphagia 1 with thin liquids. Per chart, NGT can be removed. Pt's weight has dropped an additional 3 lbs within the past week.   Labs: elevated glucose, low hemoglobin, low calcium, high sodium; phosphorus and magnesium are WNL   Height: Ht Readings from Last 1 Encounters:  10/14/14  (1.626 m)    Weight Status:   Wt Readings from Last 1 Encounters:  10/20/14 110 lb 3.7 oz (50 kg)  10/14/14 113 lb  Re-estimated needs:  Kcal: 1400-1600 Protein: 65-80 grams Fluid: 1.5 L/day  Skin:  intact  Diet Order: DIET - DYS 1 Room service appropriate?: Yes; Fluid consistency:: Thin    Intake/Output Summary (Last 24 hours) at 10/20/14 1623 Last data filed at 10/19/14 2230  Gross per 24 hour  Intake 1115.83 ml  Output      0 ml  Net 1115.83 ml    Last BM: 3/27   Labs:   Recent Labs Lab 10/18/14 1141  10/18/14 1459 10/19/14 0140 10/20/14 0550  NA 147*  --   --  146* 146*  K 3.4*  --   --  3.4* 3.9  CL 119*  --   --  118* 117*  CO2 18*  --   --  18* 22  BUN 16  --   --  16 14  CREATININE 0.69  --   --  0.66 0.57  CALCIUM  8.1*  --   --  8.2* 8.2*  MG  --   < > 1.9 1.8 1.8  PHOS  --   --  3.3 2.7 2.7  GLUCOSE 135*  --   --  163* 138*  < > = values in this interval not displayed.  CBG (last 3)   Recent Labs  10/20/14 0325 10/20/14 0748 10/20/14 1131  GLUCAP 173* 156* 174*    Scheduled Meds: . antiseptic oral rinse  7 mL Mouth Rinse BID  . cholecalciferol  1,000 Units Oral QHS  . citalopram  20 mg Oral QHS  . cyanocobalamin  1,000 mcg Oral QHS  . darifenacin  7.5 mg Oral Daily  . feeding supplement (PRO-STAT SUGAR FREE 64)  30 mL Per Tube Daily  . ferrous sulfate  300 mg Oral BID WC  . ipratropium-albuterol  3 mL Nebulization Q6H  . levETIRAcetam  500 mg Per Tube BID  . pantoprazole sodium  40 mg Per Tube Q1200  . QUEtiapine  300 mg Oral BID  . sulfamethoxazole-trimethoprim  1 tablet Oral Q12H  . tamsulosin  0.4 mg Oral QHS  . zinc sulfate  220 mg Oral Daily  Continuous Infusions: . sodium chloride 0.9 % 1,000 mL infusion 50 mL/hr at 10/20/14 0327    Ian Malkineanne Barnett RD, LDN Inpatient Clinical Dietitian Pager: [phone] After Hours Pager: [phone]

## 2014-10-20 NOTE — Progress Notes (Signed)
Patient ID: Sharon [name], female   DOB: 30-Nov-1945, 69 y.o.   MRN: [id number] Called earlier this evening the patient was having increase in respiratory rate to 33 with increased work of breathing and shortness of breath despite increase in nasal cannula oxygen 4 L. Chest x-ray at 3 PM demonstrated evidence of congestive failure and small pleural effusions.  Advised transfer to intensive care unit and contacted critical care for further evaluation and [place] of congestive failure. At this time Foley is placed patient is arousable and oriented to name following simple commands. HEENT diuresis overnight and reevaluate congestive failure.

## 2014-10-20 NOTE — Progress Notes (Signed)
No issues overnight.  EXAM:  BP 142/60 mmHg  Pulse 96  Temp(Src) 98.8 F (37.1 C) (Axillary)  Resp 20  Ht 5\' 4"  (1.626 m)  Wt 50 kg (110 lb 3.7 oz)  BMI 18.91 kg/m2  SpO2 91%  Sleepy but arousable Remains somewhat agitated Moves all extremities  IMPRESSION:  69 y.o. female s/p fall with severe TBI and underlying advanced dementia - Able to advance to dys 1 diet but remains to be seen if she will eat enough PO for adequate nutrition  PLAN: - Will observe ability to feed PO over next few days. If unable to provide adequate nutrition orally, will likely need PEG.

## 2014-10-20 NOTE — Progress Notes (Signed)
Physical Therapy Treatment Patient Details Name: Sharon [name] MRN: [id number] DOB: 06-01-1946 Today's Date: 10/20/2014    History of Present Illness 69 y.o. female s/p fall with left frontal, L temporal bleed &R cerebellar contusion, R basal skull fxs,  and post traumatic seizures; positive UTI. Hx of Bipolar and dementia.    PT Comments    Patient demonstrates need for continued max to total assist with all levels of mobility. Improvements noted in cognition related to patient ability to follow commands. Patient presenting with involuntary movements impeding functional activities in addition to poor attention and intermittent lethargy. Patient behaviors consistent with that of a Rancho level IV with some emerging level V behaviors. Will continue to see and progress as tolerated.   Follow Up Recommendations  SNF;Supervision/Assistance - 24 hour     Equipment Recommendations  Other (comment) (to be determined as pt progresses)    Recommendations for Other Services OT consult     Precautions / Restrictions Precautions Precautions: Fall Precaution Comments: apparent movemetn disorder PTA Restrictions Weight Bearing Restrictions: No    Mobility  Bed Mobility Overal bed mobility: +2 for physical assistance;Needs Assistance Bed Mobility: Supine to Sit;Sit to Supine     Supine to sit: +2 for physical assistance;Max assist Sit to supine: +2 for physical assistance;Mod assist   General bed mobility comments: Pt initiating rolling and movements to get to edge of bed. Pt initiating movements to get back into bed by reaching for bed and lifting BLE onto bed  Transfers Overall transfer level: Needs assistance Equipment used:  (face to face) Transfers: Sit to/from Stand Sit to Stand: +2 safety/equipment;Mod assist         General transfer comment: Mod A to stand. +2 helpful to amintain static standing. Decreased postural control. however, pt able to maintain midline postural  control for 2 1 min  Ambulation/Gait                 Stairs            Wheelchair Mobility    Modified Rankin (Stroke Patients Only)       Balance Overall balance assessment: Needs assistance Sitting-balance support: Single extremity supported Sitting balance-Leahy Scale: Poor Sitting balance - Comments: able to sit edge of bed with min to max a at times. Pt also demosntrates dysmetria. ? if related to history of Bipolar meds. Family states she has baseline movement disorder. Postural control: Posterior lean (difficulty with control of movement) Standing balance support: Bilateral upper extremity supported Standing balance-Leahy Scale: Poor Standing balance comment: affected by decreased attention also                    Cognition Arousal/Alertness: Lethargic Behavior During Therapy: Restless Overall Cognitive Status: Impaired/Different from baseline Area of Impairment: Orientation;Attention;Memory;Following commands;Safety/judgement;Awareness;Problem solving;Rancho level Orientation Level: Disoriented to;Situation;Time (pt states she is in the hospital) Current Attention Level: Focused Memory: Decreased short-term memory;Decreased recall of precautions Following Commands: Follows one step commands with increased time Safety/Judgement: Decreased awareness of deficits Awareness: Intellectual Problem Solving: Slow processing;Decreased initiation;Difficulty sequencing;Requires verbal cues;Requires tactile cues General Comments: more alert this session. following commands more consitently. purposeful behavior of wiping face, holding cup, showed 2 fingers, thumbs up on command    Exercises      General Comments        Pertinent Vitals/Pain Pain Assessment: Faces Faces Pain Scale: Hurts little more Pain Location: general discomfort Pain Descriptors / Indicators: Grimacing Pain Intervention(s): Limited activity within patient's tolerance  Home  Living                      Prior Function            PT Goals (current goals can now be found in the care plan section) Acute Rehab PT Goals Patient Stated Goal: unable to state PT Goal Formulation: Patient unable to participate in goal setting Time For Goal Achievement: 10/30/14 Potential to Achieve Goals: Fair Progress towards PT goals: Progressing toward goals    Frequency  Min 3X/week    PT Plan Current plan remains appropriate    Co-evaluation PT/OT/SLP Co-Evaluation/Treatment: Yes           End of Session   Activity Tolerance: Other (comment) (Restless and actively working to lay back down in bed) Patient left: in bed;with call bell/phone within reach;with family/visitor present     Time: 1021-1050 PT Time Calculation (min) (ACUTE ONLY): 29 min  Charges:  $Therapeutic Activity: 8-22 mins                    G CodesFabio Asa:      Nilo Fallin J 10/20/2014, 5:35 PM Charlotte Crumbevon Chrystian Ressler, PT DPT  [phone]

## 2014-10-21 ENCOUNTER — Inpatient Hospital Stay (HOSPITAL_COMMUNITY): Payer: Medicare Other

## 2014-10-21 DIAGNOSIS — J9600 Acute respiratory failure, unspecified whether with hypoxia or hypercapnia: Secondary | ICD-10-CM

## 2014-10-21 DIAGNOSIS — J811 Chronic pulmonary edema: Secondary | ICD-10-CM | POA: Insufficient documentation

## 2014-10-21 DIAGNOSIS — S06369D Traumatic hemorrhage of cerebrum, unspecified, with loss of consciousness of unspecified duration, subsequent encounter: Secondary | ICD-10-CM

## 2014-10-21 DIAGNOSIS — R0902 Hypoxemia: Secondary | ICD-10-CM | POA: Diagnosis not present

## 2014-10-21 LAB — GLUCOSE, CAPILLARY: Glucose-Capillary: 171 mg/dL — ABNORMAL HIGH (ref 70–99)

## 2014-10-21 MED ORDER — RESOURCE THICKENUP CLEAR PO POWD
ORAL | Status: DC | PRN
  Filled 2014-10-21 (×2): qty 125

## 2014-10-21 MED ORDER — ACETAMINOPHEN 10 MG/ML IV SOLN
1000.0000 mg | Freq: Four times a day (QID) | INTRAVENOUS | Status: AC
  Administered 2014-10-21 – 2014-10-22 (×3): 1000 mg via INTRAVENOUS
  Filled 2014-10-21 (×4): qty 100

## 2014-10-21 MED ORDER — IPRATROPIUM-ALBUTEROL 0.5-2.5 (3) MG/3ML IN SOLN
3.0000 mL | RESPIRATORY_TRACT | Status: DC | PRN
Start: 2014-10-21 — End: 2014-10-25

## 2014-10-21 MED ORDER — ACETAMINOPHEN 10 MG/ML IV SOLN
650.0000 mg | Freq: Four times a day (QID) | INTRAVENOUS | Status: DC
  Filled 2014-10-21 (×3): qty 65

## 2014-10-21 MED ORDER — FUROSEMIDE 10 MG/ML IJ SOLN
40.0000 mg | Freq: Once | INTRAMUSCULAR | Status: AC
  Administered 2014-10-21: 40 mg via INTRAVENOUS
  Filled 2014-10-21: qty 4

## 2014-10-21 NOTE — Progress Notes (Signed)
MBSS complete. Full report located under chart review in imaging section. Daylon Lafavor, MA CCC-SLP [phone]  

## 2014-10-21 NOTE — Progress Notes (Signed)
Patient able to stay awake/aler and ate approximately 75% or more of her dinner with assist from RN. No signs of distress noted. Family at bedside.  Sim BoastHavy, RN

## 2014-10-21 NOTE — Progress Notes (Addendum)
Name: Sharon [name] MRN: [id number] DOB: 09-13-1945    ADMISSION DATE:  10/14/2014 CONSULTATION DATE:  10/21/2014   REFERRING MD :  Conchita ParisNundkumar  CHIEF COMPLAINT:  fall  BRIEF PATIENT DESCRIPTION: 69 y.o with bipolar x 40 urs & dementia x 69yr adm 3/24 s/p fall in garage. Head CT showed skull fractures, left frontal & temporal contusion & occipital ICH, extensive SAH & mild SDH - 4mm midline shift. No cervical injury was noted. She was admitted for observation. Did not clear swallow eval, PCCM consulted 3/28 for med mx &  PVCs on monitor  SIGNIFICANT EVENTS  3/24 admitted s/p fall in garage with left frontal contusion and post traumatic SZ. Monitored in ICU 3/28 family discussion > DNR, transferred to SDU 3/30 resp distress, to ICU  STUDIES:  Head CT 3/25 > hemorrhagic contusion Rt cerebellum, 3 mm Lt to Rt shift  SUBJECTIVE:  Denies chest pain, or dyspnea.  Denies abdominal pain.  VITAL SIGNS: Temp:  [98.2 F (36.8 C)-98.8 F (37.1 C)] 98.4 F (36.9 C) (03/31 0753) Pulse Rate:  [87-106] 96 (03/31 0800) Resp:  [16-35] 25 (03/31 0800) BP: (109-157)/(47-95) 109/80 mmHg (03/31 0800) SpO2:  [84 %-99 %] 93 % (03/31 0844) Weight:  [118 lb 6.2 oz (53.7 kg)] 118 lb 6.2 oz (53.7 kg) (03/31 0600)  PHYSICAL EXAMINATION: General: fidgity Neuro: alert, follows commands, moves all extremities HEENT: no sinus tenderness Cardiovascular: regular Lungs: faint b/l crackles Musculoskeletal:  No edema Skin:  No rash   CBC Recent Labs     10/18/14  1141  10/19/14  0140  10/20/14  0550  WBC  2.8*  3.4*  6.7  HGB  8.2*  7.9*  8.2*  HCT  24.5*  23.1*  24.6*  PLT  96*  109*  175   BMET Recent Labs     10/18/14  1141  10/19/14  0140  10/20/14  0550  NA  147*  146*  146*  K  3.4*  3.4*  3.9  CL  119*  118*  117*  CO2  18*  18*  22  BUN  16  16  14   CREATININE  0.69  0.66  0.57  GLUCOSE  135*  163*  138*    Electrolytes Recent Labs     10/18/14  1141   10/18/14  1459   10/19/14  0140  10/20/14  0550  CALCIUM  8.1*   --    --   8.2*  8.2*  MG   --    < >  1.9  1.8  1.8  PHOS   --    --   3.3  2.7  2.7   < > = values in this interval not displayed.    Glucose Recent Labs     10/20/14  0748  10/20/14  1131  10/20/14  1651  10/20/14  2000  10/20/14  2318  10/21/14  0329  GLUCAP  156*  174*  154*  173*  157*  171*    Imaging Dg Chest Port 1 View  10/21/2014   CLINICAL DATA:  Pulmonary edema.  EXAM: PORTABLE CHEST - 1 VIEW  COMPARISON:  10/20/2014.  FINDINGS: Mediastinum and hilar structures are stable. Persistent cardiomegaly. Persistent but improving bilateral pulmonary infiltrates/ edema. Interim clearing of pleural effusions. No pneumothorax.  IMPRESSION: Congestive heart failure with pulmonary edema. Interim improvement from prior exam.   Electronically Signed   By: Maisie Fushomas  Register   On: 10/21/2014 08:10  Dg Chest Port 1 View  10/20/2014   CLINICAL DATA:  A vomiting, pancytopenia, history of CVA, possible pneumonia.  EXAM: PORTABLE CHEST - 1 VIEW  COMPARISON:  None.  FINDINGS: The lungs are well-expanded. The interstitial markings are increased diffusely. There are bilateral pleural effusions and a partially included in the field of view. The cardiac silhouette is enlarged. The pulmonary vascularity is indistinct. The bones are osteopenic.  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 Bibasilar atelectasis and/or small pleural effusions are present.  When the patient can tolerate the procedure, a PA and lateral chest x-ray would be useful.   Electronically Signed   By: David  [place]   On: 10/20/2014 15:52     ASSESSMENT / PLAN:  Acute hypoxic respiratory failure 2nd to acute pulmonary edema. Plan: - lasix 40 mg IV x one 3/31 - oxygen to keep SpO2 > 92% - f/u CXR intermittently - f/u Echo - change BD's to prn  Dysphagia with concern for aspiration. Plan: - f/u with speech - NPO for now  TBI with  skull fracture, brain contusion, ICH. Advanced dementia. Plan: - AED's per neurology - hold celexa, seroquel until able to swallow  Anemia of chronic disease. Plan: - f/u CBC - SCD's for DVT prevention  Hyperglycemia. Plan: - f/u blood sugar on BMET  Goals of care >> DNR/DNI  Summary: Respiratory status improved with diuresis.  Will transfer to telemetry.  D/w Dr. Conchita Paris.  Will ask [facility] to assume care from 4/01 and PCCM sign off.  Coralyn Helling, MD [facility] Pulmonary/Critical Care 10/21/2014, 8:56 AM Pager:  [phone] After 3pm call: [phone]

## 2014-10-21 NOTE — Progress Notes (Signed)
UR complete.  Reneka Nebergall RN, MSN 

## 2014-10-21 NOTE — Progress Notes (Signed)
Pt seen and examined. Events of last night reviewed, had respiratory distress, improved now with diuresis.  EXAM: Temp:  [98.2 F (36.8 C)-98.5 F (36.9 C)] 98.4 F (36.9 C) (03/31 0753) Pulse Rate:  [86-106] 93 (03/31 1100) Resp:  [16-35] 31 (03/31 1100) BP: (109-157)/(47-95) 138/77 mmHg (03/31 1100) SpO2:  [84 %-99 %] 93 % (03/31 1100) Weight:  [53.7 kg (118 lb 6.2 oz)] 53.7 kg (118 lb 6.2 oz) (03/31 0600) Intake/Output      03/30 0701 - 03/31 0700 03/31 0701 - 04/01 0700   I.V. (mL/kg) 1590.7 (29.6)    Other     NG/GT     IV Piggyback 100 100   Total Intake(mL/kg) 1690.7 (31.5) 100 (1.9)   Urine (mL/kg/hr) 2115 (1.6) 100 (0.4)   Stool 0 (0)    Total Output 2115 100   Net -424.3 0        Urine Occurrence 2 x    Stool Occurrence 1 x     Easily arousable No distress Moves all extremities, occasionally FC  LABS: Lab Results  Component Value Date   CREATININE 0.57 10/20/2014   BUN 14 10/20/2014   NA 146* 10/20/2014   K 3.9 10/20/2014   CL 117* 10/20/2014   CO2 22 10/20/2014   Lab Results  Component Value Date   WBC 6.7 10/20/2014   HGB 8.2* 10/20/2014   HCT 24.6* 10/20/2014   MCV 86.6 10/20/2014   PLT 175 10/20/2014    IMPRESSION: - 69 y.o. female s/p fall, remains neurologically unchanged - Respiratory distress mostly resolved with diuresis  PLAN: - Stable for transfer to floor per PCCM - Appreciate PCCM/medicine assistance as pt has no real surgical issues at this point. Will likely need evaluation for possible PEG if PO input is inadequate, and subsequent facility placement - Will need to stay on Keppra for now.

## 2014-10-21 NOTE — Progress Notes (Signed)
Speech Language Pathology Treatment: Dysphagia  Patient Details Name: Elenora GammaJoan L Fennewald MRN: [id number] DOB: 21-Mar-1946 Today's Date: 10/21/2014 Time: [id number] SLP Time Calculation (min) (ACUTE ONLY): 10 min  Assessment / Plan / Recommendation Clinical Impression  Follow-up for dysphagia . Pt maximally alert and cooperative (eyes wide open) in contrast with prior session. Pt able to sustain attention and answer questions. Pt was directly observed with puree and thin liquids with no overt s/s of aspiration, however, pt's RR increase concerning for possible incoordination of swallow/respiratory cycle. Given concern previously described and changes in CXR after diet initiation (prior day), recommend MBS to objectively evaluate swallow. Speech will follow-up with MBS findings.    HPI HPI: Pt is a 69 y/o female with PMH of advanced dementia and bipolar disorder [facility] fall at home and seizure activity. Ct revealed hemorrhagic contusion in the R cerebellum. Pt has no prior hx of dysphagia.   Pertinent Vitals Pain Assessment: Faces Faces Pain Scale: Hurts a little bit  SLP Plan  Continue with current plan of care    Recommendations Diet recommendations: NPO              Plan: Continue with current plan of care    GO     Bermudez-Bosch, Aviraj Kentner 10/21/2014, 10:42 AM

## 2014-10-21 NOTE — Progress Notes (Signed)
  Echocardiogram 2D Echocardiogram has been performed.  Delcie RochENNINGTON, Sharon [name] 10/21/2014, 5:30 PM

## 2014-10-21 NOTE — Progress Notes (Addendum)
Patien (per son)t c/o headache although she appears lethargic. RN attempted to wake patient up but she fall right back to sleep. Per Son, he prefers something IV for her headache instead of oral tylenol since she is not fully awake to takes meds orally. PCCM paged. Order received.     Sim BoastHavy,  RN

## 2014-10-21 NOTE — Progress Notes (Signed)
Patient arrived from 3mw. She appears lethargic, able to awake by voice. Safety precautions reviewed and applied. TELE applied and confirmed. 3MW Rn will bring belongings to 4N.   Sim BoastHavy, RN

## 2014-10-22 DIAGNOSIS — S06321A Contusion and laceration of left cerebrum with loss of consciousness of 30 minutes or less, initial encounter: Secondary | ICD-10-CM

## 2014-10-22 DIAGNOSIS — S0633AA Contusion and laceration of cerebrum, unspecified, with loss of consciousness status unknown, initial encounter: Secondary | ICD-10-CM | POA: Insufficient documentation

## 2014-10-22 DIAGNOSIS — S06339A Contusion and laceration of cerebrum, unspecified, with loss of consciousness of unspecified duration, initial encounter: Secondary | ICD-10-CM | POA: Insufficient documentation

## 2014-10-22 LAB — BASIC METABOLIC PANEL
ANION GAP: 6 (ref 5–15)
BUN: 20 mg/dL (ref 6–23)
CHLORIDE: 104 mmol/L (ref 96–112)
CO2: 32 mmol/L (ref 19–32)
Calcium: 8 mg/dL — ABNORMAL LOW (ref 8.4–10.5)
Creatinine, Ser: 0.66 mg/dL (ref 0.50–1.10)
GFR calc non Af Amer: 87 mL/min — ABNORMAL LOW (ref >90)
Glucose, Bld: 151 mg/dL — ABNORMAL HIGH (ref 70–99)
Potassium: 3.2 mmol/L — ABNORMAL LOW (ref 3.5–5.1)
Sodium: 142 mmol/L (ref 135–145)

## 2014-10-22 LAB — CBC
HCT: 26.7 % — ABNORMAL LOW (ref 36.0–46.0)
Hemoglobin: 8.8 g/dL — ABNORMAL LOW (ref 12.0–15.0)
MCH: 28.7 pg (ref 26.0–34.0)
MCHC: 33 g/dL (ref 30.0–36.0)
MCV: 87 fL (ref 78.0–100.0)
Platelets: 144 10*3/uL — ABNORMAL LOW (ref 150–400)
RBC: 3.07 MIL/uL — ABNORMAL LOW (ref 3.87–5.11)
RDW: 14.4 % (ref 11.5–15.5)
WBC: 2.8 10*3/uL — ABNORMAL LOW (ref 4.0–10.5)

## 2014-10-22 LAB — MAGNESIUM: MAGNESIUM: 1.6 mg/dL (ref 1.5–2.5)

## 2014-10-22 MED ORDER — MAGNESIUM SULFATE 2 GM/50ML IV SOLN
2.0000 g | Freq: Once | INTRAVENOUS | Status: AC
Start: 2014-10-22 — End: 2014-10-22
  Administered 2014-10-22: 2 g via INTRAVENOUS
  Filled 2014-10-22: qty 50

## 2014-10-22 MED ORDER — LEVETIRACETAM 500 MG PO TABS
500.0000 mg | ORAL_TABLET | Freq: Two times a day (BID) | ORAL | Status: DC
  Administered 2014-10-22 – 2014-10-25 (×8): 500 mg via ORAL
  Filled 2014-10-22 (×8): qty 1

## 2014-10-22 MED ORDER — POTASSIUM CHLORIDE 20 MEQ/15ML (10%) PO SOLN
40.0000 meq | Freq: Two times a day (BID) | ORAL | Status: AC
  Administered 2014-10-22 (×2): 40 meq via ORAL
  Filled 2014-10-22 (×2): qty 30

## 2014-10-22 NOTE — Progress Notes (Signed)
Pt. Began pulling off O2 at 3L and sats were staying in the high 80s. Called Elink as the pt. Is still a critical care pt. And they suggested mittens and a restraint order. Pt. Has personal CNA at bedside with continuous pulse ox on her toe. Will continue to monitor. Suzy Bouchardhompson, Najla Aughenbaugh E, [place]RN 10/21/2014 2310

## 2014-10-22 NOTE — Progress Notes (Signed)
[facility] possible [facility] for inpatient rehab. We discussed pt's case and OT shares that pt has made marked improvements overall and is now demonstrating behaviors consistent with [place]. We recommend that inpatient rehab consult be ordered from attending.  Thanks.  Juliann MuleJanine Alexandra Posadas, PT Rehabilitation Admissions Coordinator [phone]

## 2014-10-22 NOTE — Progress Notes (Signed)
[facility] PROGRESS NOTE  Sharon [name] AVW:[id number] DOB: 12/25/1945 DOA: 10/14/2014 PCP: Gwen Pounds, MD Interim summary: 69 year old lady admitted after a fall.  Assessment/Plan: 1. Acute respiratory failure with hypoxia from pulmonary edema: Resolved with lasix. She is on 2 lit of [employer] oxygen. Plan to wean her off the oxygen int he next 24 hours.   Dysphagia: Follow upwith SLP.    TBI with skull fracture with brain contusion and ICH: Holding seroquel and celexa.    Anemia of chronic disease: Stable.   PT recommended inpatient rehab.  Awaiting rehab consult.    UTI e coli on cultures.  On bactrim.    Hypokalemia : replete as needed.     Code Status: full code.  Family Communication:discussed with husband Disposition Plan: pending.    Consultants:  Neurology  Neuro surgery.   Procedures:  none  Antibiotics:  Bactrim.   HPI/Subjective: Comfortable no complaints.   Objective: Filed Vitals:   10/22/14 1713  BP: 115/52  Pulse: 84  Temp: 97.7 F (36.5 C)  Resp: 20    Intake/Output Summary (Last 24 hours) at 10/22/14 1842 Last data filed at 10/22/14 1827  Gross per 24 hour  Intake   1490 ml  Output    875 ml  Net    615 ml   Filed Weights   10/20/14 0500 10/21/14 0600 10/22/14 0500  Weight: 50 kg (110 lb 3.7 oz) 53.7 kg (118 lb 6.2 oz) 49.442 kg (109 lb)    Exam:   General:  Alert afebrile comfortable  Cardiovascular: s1s2  Respiratory: ctab  Abdomen: soft non tender non distended bowel sounds heard  Musculoskeletal: no pedal edema.   Data Reviewed: Basic Metabolic Panel:  Recent Labs Lab 10/18/14 1141 10/18/14 1230 10/18/14 1459 10/19/14 0140 10/20/14 0550 10/22/14 0612  NA 147*  --   --  146* 146* 142  K 3.4*  --   --  3.4* 3.9 3.2*  CL 119*  --   --  118* 117* 104  CO2 18*  --   --  18* 22 32  GLUCOSE 135*  --   --  163* 138* 151*  BUN 16  --   --  CREATININE 0.69  --   --  0.66 0.57 0.66   CALCIUM 8.1*  --   --  8.2* 8.2* 8.0*  MG  --  1.9 1.9 1.8 1.8 1.6  PHOS  --   --  3.3 2.7 2.7  --    Liver Function Tests: No results for input(s): AST, ALT, ALKPHOS, BILITOT, PROT, ALBUMIN in the last 168 hours. No results for input(s): LIPASE, AMYLASE in the last 168 hours. No results for input(s): AMMONIA in the last 168 hours. CBC:  Recent Labs Lab 10/18/14 1141 10/19/14 0140 10/20/14 0550 10/22/14 0612  WBC 2.8* 3.4* 6.7 2.8*  HGB 8.2* 7.9* 8.2* 8.8*  HCT 24.5* 23.1* 24.6* 26.7*  MCV 86.9 85.9 86.6 87.0  PLT 96* 109* 175 144*   Cardiac Enzymes: No results for input(s): CKTOTAL, CKMB, CKMBINDEX, TROPONINI in the last 168 hours. BNP (last 3 results) No results for input(s): BNP in the last 8760 hours.  ProBNP (last 3 results) No results for input(s): PROBNP in the last 8760 hours.  CBG:  Recent Labs Lab 10/20/14 1131 10/20/14 1651 10/20/14 2000 10/20/14 2318 10/21/14 0329  GLUCAP 174* 154* 173* 157* 171*    Recent Results (from the past 240 hour(s))  Blood culture (routine x  2)     Status: None   Collection Time: 10/14/14 10:15 AM  Result Value Ref Range Status   Specimen Description BLOOD LEFT HAND  Final   Special Requests BOTTLES DRAWN AEROBIC AND ANAEROBIC  Final   Culture   Final    NO GROWTH 5 DAYS Performed at [facility]    Report Status 10/20/2014 FINAL  Final  Blood culture (routine x 2)     Status: None   Collection Time: 10/14/14 10:30 AM  Result Value Ref Range Status   Specimen Description BLOOD LEFT HAND  Final   Special Requests BOTTLES DRAWN AEROBIC AND ANAEROBIC  Final   Culture   Final    NO GROWTH 5 DAYS Performed at [facility]    Report Status 10/20/2014 FINAL  Final  Urine culture     Status: None   Collection Time: 10/14/14 11:05 AM  Result Value Ref Range Status   Specimen Description URINE, CATHETERIZED  Final   Special Requests NONE  Final   Colony Count   Final    >=100,000  COLONIES/ML Performed at [facility]    Culture   Final    ESCHERICHIA COLI Performed at [facility]    Report Status 10/16/2014 FINAL  Final   Organism ID, Bacteria ESCHERICHIA COLI  Final      Susceptibility   Escherichia coli - MIC*    AMPICILLIN 4 SENSITIVE Sensitive     CEFAZOLIN <=4 SENSITIVE Sensitive     CEFTRIAXONE <=1 SENSITIVE Sensitive     CIPROFLOXACIN <=0.25 SENSITIVE Sensitive     GENTAMICIN <=1 SENSITIVE Sensitive     LEVOFLOXACIN <=0.12 SENSITIVE Sensitive     NITROFURANTOIN <=16 SENSITIVE Sensitive     TOBRAMYCIN <=1 SENSITIVE Sensitive     TRIMETH/SULFA <=20 SENSITIVE Sensitive     PIP/TAZO <=4 SENSITIVE Sensitive     * ESCHERICHIA COLI  MRSA PCR Screening     Status: None   Collection Time: 10/14/14  4:53 PM  Result Value Ref Range Status   MRSA by PCR NEGATIVE NEGATIVE Final    Comment:        The GeneXpert MRSA Assay (FDA approved for NASAL specimens only), is one component of a comprehensive MRSA colonization surveillance program. It is not intended to diagnose MRSA infection nor to guide or monitor treatment for MRSA infections.      Studies: Dg Chest Port 1 View  10/21/2014   CLINICAL DATA:  Pulmonary edema.  EXAM: PORTABLE CHEST - 1 VIEW  COMPARISON:  10/20/2014.  FINDINGS: Mediastinum and hilar structures are stable. Persistent cardiomegaly. Persistent but improving bilateral pulmonary infiltrates/ edema. Interim clearing of pleural effusions. No pneumothorax.  IMPRESSION: Congestive heart failure with pulmonary edema. Interim improvement from prior exam.   Electronically Signed   By: Maisie Fus  Register   On: 10/21/2014 08:10   Dg Swallowing Func-speech Pathology  10/21/2014    Objective Swallowing Evaluation:   Completed by Leonia Corona, SLP  Student, Supervised and reviewed by Harlon Ditty MA CCC-SLP  Patient Details  Name: Sharon [name] MRN: [id number] Date of Birth: 04-28-1946  Today's Date: 10/21/2014 Time: SLP Start  Time (ACUTE ONLY): 1137-SLP Stop Time (ACUTE ONLY): 1155 SLP Time Calculation (min) (ACUTE ONLY): 18 min  Past Medical History:  Past Medical History  Diagnosis Date  . Bipolar 1 disorder   . Dementia   . Other pancytopenia 06/03/2014  . Stroke    Past Surgical History:  Past  Surgical History  Procedure Laterality Date  . Cholecystectomy     HPI:  HPI: Pt is a 69 y/o female with PMH of advanced dementia and bipolar  disorder who [facility] a fall at home and seizure activity.  Ct revealed hemorrhagic contusion in the R cerebellum. Pt has no prior hx  of dysphagia.  No Data Recorded  Assessment / Plan / Recommendation CHL IP CLINICAL IMPRESSIONS 10/21/2014  Dysphagia Diagnosis Mild oral phase dysphagia;Moderate oral phase  dysphagia  Clinical impression Pt presents with moderate oral and moderate pharyngeal  phase dysphagia characterized by slow oral transit, base of tongue  weakness and delayed swallow initiation to the pyriforms.Pt aspirated with  thin and nectar thick liquids during the swallow. Sensation intermittent.  Mild-moderate amount of apirate ; not expelled despite cue to cough. No  aspiration observed with teaspoon amounts of honey-thick liquids. Pt still  at risk for aspiration given delayed swallow initiation, reduced airway  protection and reduced sensation. Recommend pt initiate dys 1/ honey thick  liquid diet; only teaspoon amounts of honey; meds whole in puree. Speech  will follow-up with diet tolerance.       CHL IP TREATMENT RECOMMENDATION 10/21/2014  Treatment Plan Recommendations Therapy as outlined in treatment plan below      CHL IP DIET RECOMMENDATION 10/21/2014  Diet Recommendations Dysphagia 1 (Puree);Honey-thick liquid  Liquid Administration via Spoon  Medication Administration Whole meds with puree  Compensations Slow rate;Small sips/bites  Postural Changes and/or Swallow Maneuvers Seated upright 90 degrees     CHL IP OTHER RECOMMENDATIONS 10/21/2014  Recommended Consults  (None)  Oral Care Recommendations Oral care Q4 per protocol  Other Recommendations Order thickener from pharmacy     CHL IP FOLLOW UP RECOMMENDATIONS 10/21/2014  Follow up Recommendations Inpatient Rehab     CHL IP FREQUENCY AND DURATION 10/21/2014  Speech Therapy Frequency (ACUTE ONLY) min 3x week  Treatment Duration 2 weeks     Pertinent Vitals/Pain NA    SLP Swallow Goals No flowsheet data found.  No flowsheet data found.    CHL IP REASON FOR REFERRAL 10/21/2014  Reason for Referral Objectively evaluate swallowing function     CHL IP ORAL PHASE 10/21/2014  Lips (None)  Tongue (None)  Mucous membranes (None)  Nutritional status (None)  Other (None)  Oxygen therapy (None)  Oral Phase Impaired  Oral - Pudding Teaspoon (None)  Oral - Pudding Cup (None)  Oral - Honey Teaspoon Delayed oral transit  Oral - Honey Cup Delayed oral transit  Oral - Honey Syringe (None)  Oral - Nectar Teaspoon (None)  Oral - Nectar Cup (None)  Oral - Nectar Straw Delayed oral transit  Oral - Nectar Syringe (None)  Oral - Ice Chips (None)  Oral - Thin Teaspoon Other (Comment)  Oral - Thin Cup Other (Comment)  Oral - Thin Straw Other (Comment)  Oral - Thin Syringe (None)  Oral - Puree Delayed oral transit  Oral - Mechanical Soft (None)  Oral - Regular Delayed oral transit  Oral - Multi-consistency (None)  Oral - Pill (None)  Oral Phase - Comment (None)      CHL IP PHARYNGEAL PHASE 10/21/2014  Pharyngeal Phase Impaired  Pharyngeal - Pudding Teaspoon (None)  Penetration/Aspiration details (pudding teaspoon) (None)  Pharyngeal - Pudding Cup (None)  Penetration/Aspiration details (pudding cup) (None)  Pharyngeal - Honey Teaspoon Delayed swallow initiation;Premature spillage  to pyriform sinuses  Penetration/Aspiration details (honey teaspoon) (None)  Pharyngeal - Honey Cup Delayed swallow initiation;Premature  spillage to  pyriform sinuses  Penetration/Aspiration details (honey cup) (None)  Pharyngeal - Honey Syringe (None)  Penetration/Aspiration  details (honey syringe) (None)  Pharyngeal - Nectar Teaspoon (None)  Penetration/Aspiration details (nectar teaspoon) (None)  Pharyngeal - Nectar Cup Penetration/Aspiration before swallow  Penetration/Aspiration details (nectar cup) Material enters airway, passes  BELOW cords and not ejected out despite cough attempt by patient  Pharyngeal - Nectar Straw Delayed swallow initiation;Premature spillage to  pyriform sinuses;Penetration/Aspiration before swallow  Penetration/Aspiration details (nectar straw) Material enters airway,  passes BELOW cords and not ejected out despite cough attempt by patient  Pharyngeal - Nectar Syringe (None)  Penetration/Aspiration details (nectar syringe) (None)  Pharyngeal - Ice Chips (None)  Penetration/Aspiration details (ice chips) (None)  Pharyngeal - Thin Teaspoon Delayed swallow initiation;Premature spillage  to pyriform sinuses;Penetration/Aspiration before swallow  Penetration/Aspiration details (thin teaspoon) Material enters airway,  passes BELOW cords and not ejected out despite cough attempt by patient  Pharyngeal - Thin Cup Delayed swallow initiation;Premature spillage to  pyriform sinuses;Penetration/Aspiration before swallow  Penetration/Aspiration details (thin cup) Material enters airway, passes  BELOW cords and not ejected out despite cough attempt by patient  Pharyngeal - Thin Straw Delayed swallow initiation;Premature spillage to  pyriform sinuses;Penetration/Aspiration before swallow  Penetration/Aspiration details (thin straw) Material enters airway, passes  BELOW cords and not ejected out despite cough attempt by patient  Pharyngeal - Thin Syringe (None)  Penetration/Aspiration details (thin syringe') (None)  Pharyngeal - Puree Delayed swallow initiation;Premature spillage to  valleculae;Pharyngeal residue - valleculae  Penetration/Aspiration details (puree) (None)  Pharyngeal - Mechanical Soft (None)  Penetration/Aspiration details (mechanical soft) (None)   Pharyngeal - Regular Delayed swallow initiation;Premature spillage to  valleculae;Pharyngeal residue - valleculae  Penetration/Aspiration details (regular) (None)  Pharyngeal - Multi-consistency (None)  Penetration/Aspiration details (multi-consistency) (None)  Pharyngeal - Pill Delayed swallow initiation;Premature spillage to  valleculae  Penetration/Aspiration details (pill) (None)  Pharyngeal Comment (None)     CHL IP CERVICAL ESOPHAGEAL PHASE 10/21/2014  Cervical Esophageal Phase WFL  Pudding Teaspoon (None)  Pudding Cup (None)  Honey Teaspoon (None)  Honey Cup (None)  Honey Syringe (None)  Nectar Teaspoon (None)  Nectar Cup (None)  Nectar Straw (None)  Nectar Syringe (None)  Thin Teaspoon (None)  Thin Cup (None)  Thin Straw (None)  Thin Syringe (None)  Cervical Esophageal Comment (None)    No flowsheet data found.         DeBlois, Riley Nearing 10/21/2014, 2:00 PM       Scheduled Meds: . antiseptic oral rinse  7 mL Mouth Rinse BID  . cholecalciferol  1,000 Units Oral QHS  . citalopram  20 mg Oral QHS  . cyanocobalamin  1,000 mcg Oral QHS  . darifenacin  7.5 mg Oral Daily  . feeding supplement (ENSURE ENLIVE)  237 mL Oral BID BM  . ferrous sulfate  300 mg Oral BID WC  . levETIRAcetam  500 mg Oral BID  . pantoprazole sodium  40 mg Per Tube Q1200  . potassium chloride  40 mEq Oral BID  . QUEtiapine  300 mg Oral BID  . sulfamethoxazole-trimethoprim  1 tablet Oral Q12H  . tamsulosin  0.4 mg Oral QHS  . zinc sulfate  220 mg Oral Daily   Continuous Infusions: . sodium chloride 0.9 % 1,000 mL infusion 10 mL/hr at 10/20/14 2136    Active Problems:   Intracranial hemorrhage   Intracerebral hemorrhage   Hypoxemia   Pulmonary edema    Time spent: 25 minutes     Shawnta Schlegel  [facility] Pager [phone]. If 7PM-7AM, please contact night-coverage at [email or web address], password [facility] 10/22/2014, 6:42 PM  LOS: 8 days

## 2014-10-22 NOTE — Care Management Note (Signed)
    Page 1 of 1   10/22/2014     4:44:12 PM CARE MANAGEMENT NOTE 10/22/2014  Patient:  Sharon [name],Sharon [name]   Account Number:  [id number]  Date Initiated:  10/22/2014  Documentation initiated by:  Elmer BalesOBARGE,Jadan Hinojos  Subjective/Objective Assessment:   Patient was admitted with ICH. Lives at home with spouse     Action/Plan:   Will follow for discharge needs pending PT/OT evals and physician orders.   Anticipated DC Date:     Anticipated DC Plan:  SKILLED NURSING FACILITY  In-house referral  Clinical Social Worker         Choice offered to / List presented to:             Status of service:   Medicare Important Message given?  YES (If response is "NO", the following Medicare IM given date fields will be blank) Date Medicare IM given:  10/19/2014 Medicare IM given by:  Lawerance SabalSWIST,DEBBIE Date Additional Medicare IM given:  10/22/2014 Additional Medicare IM given by:  Elmer BalesOURTNEY Toyia Jelinek  Discharge Disposition:    Per UR Regulation:    If discussed at Long Length of Stay Meetings, dates discussed:    Comments:  10/19/14 10:45 IM letter provided. Lawerance Sabalebbie Swist RN BSN CM

## 2014-10-22 NOTE — Progress Notes (Signed)
Occupational Therapy Treatment Patient Details Name: Sharon [name] MRN: [id number] DOB: 1945-12-21 Today's Date: 10/22/2014    History of present illness 69 y.o. female s/p fall with left frontal, L temporal bleed &R cerebellar contusion, R basal skull fxs,  and post traumatic seizures; positive UTI. Hx of Bipolar and dementia.   OT comments  Pt making excellent progress. Consistently performing at a Rancho level VI (confused/appropriate) today during co-treat with Pt/OT. More verbal and initiating and participating with ADL /demonstrating emergent awareness. Given patient's improved performance, feel a CIR consult is very appropriate to maximize return to PLOF. Family is able to provide 24/7 S. Will continue to follow acutely to address goals and facilitate D/C to next venue of care.   Follow Up Recommendations  CIR;Supervision/Assistance - 24 hour    Equipment Recommendations  3 in 1 bedside comode;Tub/shower bench    Recommendations for Other Services Rehab consult    Precautions / Restrictions Precautions Precautions: Fall Precaution Comments: apparent movement disorder PTA Restrictions Weight Bearing Restrictions: No       Mobility Bed Mobility Overal bed mobility: Needs Assistance Bed Mobility: Supine to Sit;Sit to Supine     Supine to sit: Min assist Sit to supine: Supervision   General bed mobility comments: Pt appropriately reclining self into bed  Transfers Overall transfer level: Needs assistance Equipment used:  (wrap around with gait belt) Transfers: Sit to/from BJ'sStand;Stand Pivot Transfers Sit to Stand: Mod assist;+2 physical assistance Stand pivot transfers: Mod assist;+2 physical assistance  Min A at times.       General transfer comment: Pt with L sided weakness. Improved ability to maintain control of LLE. Facilitation through trunk at rib cage and scapula to increase upright posture during mobility.    Balance Overall balance assessment: Needs  assistance   Sitting balance-Leahy Scale: Fair Sitting balance - Comments: Pt able to maintain upright midline posture. Dyskinetic movemetns (?baseline); support to maintain upright posture most likely due to pt wanting to lie back down instead of due to weakness. Able to reach out of base of support to throw item toward trash     Standing balance-Leahy Scale: Poor Standing balance comment: assist for stability                   ADL Overall ADL's : Needs assistance/impaired Eating/Feeding: Moderate assistance;Sitting Eating/Feeding Details (indicate cue type and reason): honey thick liquid; pt able to hold cup and bring to mouth. able to monitor sip size Grooming: Moderate assistance Grooming Details (indicate cue type and reason): Pt able to direct care, stating " I need to turn the water on", i need a towel. Weakness noted L hand during functional tasks. Upper Body Bathing: Moderate assistance   Lower Body Bathing: Moderate assistance;Bed level   Upper Body Dressing : Moderate assistance Upper Body Dressing Details (indicate cue type and reason): pt assisting with reaching through armhomes. Trying to tie strings     Toilet Transfer: +2 for physical assistance;Moderate assistance Toilet Transfer Details (indicate cue type and reason): for safety Toileting- Clothing Manipulation and Hygiene: Moderate assistance;Sitting/lateral lean;Sit to/from stand Toileting - Clothing Manipulation Details (indicate cue type and reason): Pt appropriately reaching for toilet paper and using R hand to complete hygiene after toileting. Approrpiately attemting to use LUE to hold clothing while copleting toileting task.     Functional mobility during ADLs: Moderate assistance;+2 for physical assistance (ambulating with +2 using modified 3 musketeer and HHA)        Vision  Increased  visual attention to L field. Will continue to assess                   Perception  no over/undershooting  noted   Praxis  increased initiation; most likely imipulsive at baseline    Cognition   Behavior During Therapy: [facility] for tasks assessed/performed Overall Cognitive Status: Impaired/Different from baseline Area of Impairment: Orientation;Attention;Problem solving;Rancho level Orientation Level: Disoriented to;Place;Time Current Attention Level: Sustained Memory: Decreased recall of precautions  Following Commands: Follows one step commands consistently Safety/Judgement: Decreased awareness of safety Awareness: Emergent Problem Solving: Slow processing General Comments: improve ability to sequence tasks, patient able to engage in conversation, noting need for use of bathroom, able to demonstrate improvements in awareness during functional tasks. Dramatic improvement in cognition from previous session. Demonstrating emergent awareness during ADL. Reaching appropriately to dry hands/throw items away in trash/prepare toilet paper for wiping. Pt able to state that she was in the hospital because she "fell and hit her head on the ground".    Extremity/Trunk Assessment   LUE weakness. Able to use LUE in isolated movement patterns, however stronger proximally. Able to demostrate gross grasp/release. Frequently dropping item. Note  L inattention. ? Sensation L UE. Able to assist with turning on.off water at faucet.                              Pertinent Vitals/ Pain       Pain Assessment: Faces Faces Pain Scale: Hurts a little bit Pain Location: head Pain Descriptors / Indicators: Aching Pain Intervention(s): Limited activity within patient's tolerance  Home Living                                          Prior Functioning/Environment              Frequency Min 2X/week     Progress Toward Goals  OT Goals(current goals can now be found in the care plan section)  Progress towards OT goals: Progressing toward goals  Acute Rehab OT Goals Patient Stated  Goal: to go home OT Goal Formulation: Patient unable to participate in goal setting Time For Goal Achievement: 11/03/14 Potential to Achieve Goals: Good ADL Goals Pt Will Perform Eating: with min assist;sitting Pt Will Perform Grooming: with min assist;sitting Pt Will Perform Upper Body Bathing: with min assist;sitting Pt Will Perform Lower Body Bathing: with mod assist;sit to/from stand Pt Will Transfer to Toilet: with min assist;bedside commode;stand pivot transfer Additional ADL Goal #1: Demonstrate selective attention during ADL task with mod redirectional  vc Additional ADL Goal #2: Demonstrate use of LUE as functional assist with min A  Plan Discharge plan needs to be updated    Co-evaluation      Reason for Co-Treatment: Complexity of the patient's impairments (multi-system involvement);Necessary to address cognition/behavior during functional activity;For patient/therapist safety PT goals addressed during session: Mobility/safety with mobility        End of Session Equipment Utilized During Treatment: Gait belt   Activity Tolerance Patient tolerated treatment well   Patient Left in bed;with call bell/phone within reach;with nursing/sitter in room   Nurse Communication Mobility status;Other (comment) (Pt will need sitter in order to be OOB in chair)        Time: 1026-1100 OT Time Calculation (min): 34 min  Charges: OT General Charges $OT Visit:  1 Procedure OT Treatments $Self Care/Home Management : 8-22 mins  Daleysa Kristiansen,HILLARY 10/22/2014, 1:42 PM   [facility], OTR/L  [phone] 10/22/2014

## 2014-10-22 NOTE — Progress Notes (Signed)
No issues overnight. Pt calm this am, says she feels well. Has no pain.  EXAM:  BP 126/51 mmHg  Pulse 87  Temp(Src) 98.1 F (36.7 C) (Oral)  Resp 20  Ht 5\' 4"  (1.626 m)  Wt 49.442 kg (109 lb)  BMI 18.70 kg/m2  SpO2 100%  Awake, alert,  Dysarthric but easily understandable Follows simple commands Moves all extremities well  IMPRESSION:  69 y.o. female s/p fall with multiple brain contusions not requiring any surgical intervention. Neurologic status has been relatively stable, although she does have periods of waxing and waning confusion/agitation more likely related to her underlying advanced dementia.  PLAN: - Cont observation  - Monitor nutrition status. Hopefully can take enough PO to avoid PEG. - Will likely need placement

## 2014-10-22 NOTE — Progress Notes (Signed)
Physical Therapy Treatment Patient Details Name: Elenora GammaJoan L Marzette MRN: [id number] DOB: [birth year] Today's Date: 10/22/2014    History of Present Illness 69 y.o. female s/p fall with left frontal, L temporal bleed &R cerebellar contusion, R basal skull fxs,  and post traumatic seizures; positive UTI. Hx of Bipolar and dementia. (Simultaneous filing. User may not have seen previous data.)    PT Comments    Patient making significant progress this session. Patient tolerated ambulation with assist, improved cognition with carry over this session. Patient continues to requires moderate +2 assist for stability. Given arousal improvements and cognitive progression, feel patient is now at appropriate levels (behaviors now consistent with Rancho level VI) to participate and benefit from CIR rehabilitation. Recommend CIR consult. Will continue to see and progress as tolerated.  Follow Up Recommendations  CIR;Supervision/Assistance - 24 hour     Equipment Recommendations  Other (comment) (to be determined as pt progresses)    Recommendations for Other Services       Precautions / Restrictions Precautions Precautions: Fall (Simultaneous filing. User may not have seen previous data.) Precaution Comments: apparent movement disorder PTA Restrictions Weight Bearing Restrictions: No    Mobility  Bed Mobility                  Transfers Overall transfer level: Needs assistance Equipment used:  (wrap around with gait belt) Transfers: Sit to/from Stand Sit to Stand: Mod assist;+2 physical assistance         General transfer comment: performed from toilet, bed, and hall bench  Ambulation/Gait Ambulation/Gait assistance: Mod assist;+2 physical assistance (+2 wrap around support with gait belt) Ambulation Distance (Feet): 160 Feet Assistive device: 2 person hand held assist Gait Pattern/deviations: Step-through pattern;Decreased stride length;Drifts right/left;Narrow base of support Gait  velocity: decreased Gait velocity interpretation: Below normal speed for age/gender General Gait Details: instability noted, improved control of abnormal movements this session. Patient ambulated with 1 seated rest break.    Stairs            Wheelchair Mobility    Modified Rankin (Stroke Patients Only)       Balance Overall balance assessment: Needs assistance   Sitting balance-Leahy Scale: Poor Sitting balance - Comments: improved ability to sit EOB, but continues to require bilateral support at times secondary to dyskensia like movements     Standing balance-Leahy Scale: Poor Standing balance comment: Assist for stability, performing dynamic activities at counter.                    Cognition Arousal/Alertness: Awake/alert Behavior During Therapy: WFL for tasks assessed/performed Overall Cognitive Status: Impaired/Different from baseline Area of Impairment: Orientation;Attention;Problem solving;Rancho level Orientation Level: Disoriented to;Place;Time Current Attention Level: Sustained Memory: Decreased recall of precautions Following Commands: Follows one step commands consistently Safety/Judgement: Decreased awareness of safety Awareness: Emergent Problem Solving: Slow processing General Comments: improve ability to sequence tasks, patient able to engage in conversation, noting need for use of bathroom, able to demonstrate improvements in awareness during functional tasks    Exercises      General Comments General comments (skin integrity, edema, etc.): SpO2 >96% on room air during spot checks, tolerated EOB balance >10 minutes      Pertinent Vitals/Pain Pain Assessment: Faces Faces Pain Scale: Hurts a little bit Pain Location: head Pain Descriptors / Indicators: Aching Pain Intervention(s): Limited activity within patient's tolerance;Monitored during session    Home Living  Prior Function            PT Goals  (current goals can now be found in the care plan section) Acute Rehab PT Goals Patient Stated Goal: none stated PT Goal Formulation: Patient unable to participate in goal setting Time For Goal Achievement: 10/30/14 Potential to Achieve Goals: Good Progress towards PT goals: Progressing toward goals    Frequency  Min 3X/week    PT Plan Discharge plan needs to be updated    Co-evaluation PT/OT/SLP Co-Evaluation/Treatment: Yes Reason for Co-Treatment: Complexity of the patient's impairments (multi-system involvement) PT goals addressed during session: Mobility/safety with mobility       End of Session Equipment Utilized During Treatment: Gait belt Activity Tolerance: Patient tolerated treatment well Patient left: in bed;with call bell/phone within reach;with family/visitor present     Time: 1027-1100 PT Time Calculation (min) (ACUTE ONLY): 33 min  Charges:  $Gait Training: 8-22 mins                    G CodesFabio Asa 11-12-2014, 1:05 PM Charlotte Crumb, PT DPT  [phone]

## 2014-10-22 NOTE — Progress Notes (Signed)
Speech Language Pathology Treatment: Dysphagia  Patient Details Name: Sharon [name] MRN: [id number] DOB: 08-07-1945 Today's Date: 10/22/2014 Time: 1050-1100 SLP Time Calculation (min) (ACUTE ONLY): 10 min  Assessment / Plan / Recommendation Clinical Impression  Pt again demonstrating excellent improvement in arousal, cognitive function, today fully alert with behavior consistent with a Rancho VI (confused, appropriate). Pt able to tolerate honey thick liquids via teaspoon and cup with improved lip rounding and automaticity in a natural setting. Recommend pt continue a Dys 1 (puree) diet and honey thick liquids. Will follow for further interventions.    HPI HPI: Pt is a 69 y/o female with PMH of advanced dementia and bipolar disorder [facility] at home and seizure activity. Ct revealed hemorrhagic contusion in the R cerebellum. Pt has no prior hx of dysphagia.   Pertinent Vitals Pain Assessment: No/denies pain  SLP Plan  Continue with current plan of care    Recommendations Diet recommendations: Dysphagia 1 (puree);Honey-thick liquid Liquids provided via: Teaspoon;Cup Medication Administration: Whole meds with puree Supervision: Full supervision/cueing for compensatory strategies Compensations: Slow rate;Small sips/bites Postural Changes and/or Swallow Maneuvers: Seated upright 90 degrees              Oral Care Recommendations: Oral care BID Follow up Recommendations: Inpatient Rehab Plan: Continue with current plan of care    GO    [facility]anoBonnie Trinidad Petron, MA CCC-SLP [phone]  [name], Gordon Vandunk Caroline 10/22/2014, 12:23 PM

## 2014-10-22 NOTE — Consult Note (Signed)
Physical Medicine and Rehabilitation Consult Reason for Consult:confusion, gait deficits Referring Physician: Blake Divine   HPI: Sharon [name] is a 69 y.o. female who presented after a fall with left frontal, L temporal bleed &R cerebellar contusion, R basal skull fxs, and post traumatic seizures. Hospital course also significant for UTI. Pt making progress in therapy, displaying increased activity tolerance. It was felt that she might benefit from an inpatient rehab admission; therefore and inpatient rehab consult was requested.    ROS Past Medical History  Diagnosis Date  . Bipolar 1 disorder   . Dementia   . Other pancytopenia 06/03/2014  . Stroke    Past Surgical History  Procedure Laterality Date  . Cholecystectomy     Family History  Problem Relation Age of Onset  . Cancer Mother   . Heart attack Father    Social History:  reports that she has never smoked. She has never used smokeless tobacco. She reports that she does not drink alcohol or use illicit drugs. Allergies: No Known Allergies Medications Prior to Admission  Medication Sig Dispense Refill  . acetaminophen (TYLENOL) 500 MG tablet Take 500 mg by mouth at bedtime as needed for moderate pain.    Marland Kitchen aspirin EC 81 MG tablet Take 81 mg by mouth at bedtime.    . Cholecalciferol (VITAMIN D3) 1000 UNITS CAPS Take 1 capsule by mouth at bedtime as needed.     . citalopram (CELEXA) 40 MG tablet Take 20 mg by mouth at bedtime.   1  . Fish Oil-Cholecalciferol (FISH OIL + D3) 1200-1000 MG-UNIT CAPS Take 1,200 mg by mouth at bedtime.     Marland Kitchen FLUZONE HIGH-DOSE 0.5 ML SUSY Inject 1 each into the skin once.   0  . QUEtiapine (SEROQUEL) 300 MG tablet Take 300 mg by mouth 2 (two) times daily.     . tamsulosin (FLOMAX) 0.4 MG CAPS capsule Take 0.4 mg by mouth at bedtime.   3  . trospium (SANCTURA) 20 MG tablet Take 20 mg by mouth 2 (two) times daily.   11  . vitamin B-12 (CYANOCOBALAMIN) 500 MCG tablet Take 1,000 mcg by mouth at  bedtime.    Marland Kitchen zinc sulfate 220 MG capsule Take 220 mg by mouth daily.    . ferrous sulfate 325 (65 FE) MG EC tablet Take 1 tablet (325 mg total) by mouth 2 (two) times daily after a meal. 60 tablet 3    Home: Home Living Family/patient expects to be discharged to:: Skilled nursing facility Living Arrangements: Spouse/significant other Available Help at Discharge: Family Type of Home: House Additional Comments: Pt unable to provide information related to home situationa nd available assist; We do know that she lives with her husband, who was unavailable at time of eval; Given PMH and current functuional status, SNF for rehab is a safe, reasonable option  Lives With: Spouse  Functional History: Prior Function Level of Independence: Needs assistance Gait / Transfers Assistance Needed: independent ADL's / Homemaking Assistance Needed: Husband provided S for ADL. Husband states wife spent alot of time "rocking" in a chair./ had nights/days confused at baseline Comments: Husband states pt had "movement problems" PTA. ? dyskinesia Functional Status:  Mobility: Bed Mobility Overal bed mobility: Needs Assistance Bed Mobility: Supine to Sit, Sit to Supine Supine to sit: Min assist Sit to supine: Supervision General bed mobility comments: Pt appropriately reclining self into bed Transfers Overall transfer level: Needs assistance Equipment used:  (wrap around with gait belt) Transfers:  Sit to/from Stand, [employer] Transfers Sit to Stand: Mod assist, +2 physical assistance Stand pivot transfers: Mod assist, +2 physical assistance  Lateral/Scoot Transfers: Total assist, +2 safety/equipment, +2 physical assistance General transfer comment: Pt with L sided weakness. Improved ability to maintain control of LLE. Facilitation through trunk at rib cage and scapula to increase upright posture during mobility. Ambulation/Gait Ambulation/Gait assistance: Mod assist, +2 physical assistance (+2 wrap  around support with gait belt) Ambulation Distance (Feet): 160 Feet Assistive device: 2 person hand held assist Gait Pattern/deviations: Step-through pattern, Decreased stride length, Drifts right/left, Narrow base of support Gait velocity: decreased Gait velocity interpretation: Below normal speed for age/gender General Gait Details: instability noted, improved control of abnormal movements this session. Patient ambulated with 1 seated rest break.     ADL: ADL Overall ADL's : Needs assistance/impaired Eating/Feeding: Moderate assistance, Sitting Eating/Feeding Details (indicate cue type and reason): honey thick liquid; pt able to hold cup and bring to mouth. able to monitor sip size Grooming: Moderate assistance Grooming Details (indicate cue type and reason): Pt able to direct care, stating " I need to turn the water on", i need a towel. Weakness noted L hand during functional tasks. Upper Body Bathing: Moderate assistance Lower Body Bathing: Moderate assistance, Bed level Upper Body Dressing : Moderate assistance Upper Body Dressing Details (indicate cue type and reason): pt assisting with reaching through armhomes. Trying to tie strings Toilet Transfer: +2 for physical assistance, Moderate assistance Toilet Transfer Details (indicate cue type and reason): for safety Toileting- Clothing Manipulation and Hygiene: Moderate assistance, Sitting/lateral lean, Sit to/from stand Toileting - Clothing Manipulation Details (indicate cue type and reason): Pt appropriately reaching for toilet paper and using R hand to complete hygiene after toileting. Approrpiately attemting to use LUE to hold clothing while copleting toileting task. Functional mobility during ADLs: Moderate assistance, +2 for physical assistance (ambulating with +2 using modified 3 musketeer and HHA) General ADL Comments: Max A at this time for ADL. Following commands with increased time. Mod A at times to maintain trunk control due  to attention, lethargy and apparent movement disorder.  Cognition: Cognition Overall Cognitive Status: Impaired/Different from baseline Arousal/Alertness: Lethargic Orientation Level: Oriented to place, Oriented to person Attention: Focused, Sustained Focused Attention: Impaired Focused Attention Impairment: Verbal basic, Functional basic Sustained Attention: Impaired Sustained Attention Impairment: Verbal basic, Functional basic Memory: Impaired Memory Impairment: Retrieval deficit, Decreased recall of new information Awareness: Impaired Awareness Impairment: Intellectual impairment, Emergent impairment, Anticipatory impairment Problem Solving: Impaired Problem Solving Impairment: Verbal basic, Functional basic Executive Function: Initiating Initiating: Impaired Initiating Impairment: Verbal basic, Functional basic Behaviors: Other (comment) (RN reports pt intermittently restless) Safety/Judgment: Impaired [place] Scales of Cognitive Functioning: Confused/appropriate Cognition Arousal/Alertness: Awake/alert Behavior During Therapy: WFL for tasks assessed/performed Overall Cognitive Status: Impaired/Different from baseline Area of Impairment: Orientation, Attention, Problem solving, Rancho level Orientation Level: Disoriented to, Place, Time Current Attention Level: Sustained Memory: Decreased recall of precautions Following Commands: Follows one step commands consistently Safety/Judgement: Decreased awareness of safety Awareness: Emergent Problem Solving: Slow processing General Comments: improve ability to sequence tasks, patient able to engage in conversation, noting need for use of bathroom, able to demonstrate improvements in awareness during functional tasks  Blood pressure 115/48, pulse 81, temperature 97.5 F (36.4 C), temperature source Oral, resp. rate 20, height 5\' 4"  (1.626 m), weight 49.442 kg (109 lb), SpO2 93 %. Physical Exam  Results for orders  placed or performed during the hospital encounter of 10/14/14 (from the past 24 hour(s))  Basic metabolic panel     Status: Abnormal   Collection Time: 10/22/14  6:12 AM  Result Value Ref Range   Sodium 142 135 - 145 mmol/L   Potassium 3.2 (L) 3.5 - 5.1 mmol/L   Chloride 104 96 - 112 mmol/L   CO2 32 19 - 32 mmol/L   Glucose, Bld 151 (H) 70 - 99 mg/dL   BUN 20 6 - 23 mg/dL   Creatinine, Ser 1.61 0.50 - 1.10 mg/dL   Calcium 8.0 (L) 8.4 - 10.5 mg/dL   GFR calc non Af Amer 87 (L) >90 mL/min   GFR calc Af Amer >90 >90 mL/min   Anion gap 6 5 - 15  CBC     Status: Abnormal   Collection Time: 10/22/14  6:12 AM  Result Value Ref Range   WBC 2.8 (L) 4.0 - 10.5 K/uL   RBC 3.07 (L) 3.87 - 5.11 MIL/uL   Hemoglobin 8.8 (L) 12.0 - 15.0 g/dL   HCT 09.6 (L) 04.5 - 40.9 %   MCV 87.0 78.0 - 100.0 fL   MCH 28.7 26.0 - 34.0 pg   MCHC 33.0 30.0 - 36.0 g/dL   RDW 81.1 91.4 - 78.2 %   Platelets 144 (L) 150 - 400 K/uL  Magnesium     Status: None   Collection Time: 10/22/14  6:12 AM  Result Value Ref Range   Magnesium 1.6 1.5 - 2.5 mg/dL   Dg Chest Port 1 View  10/21/2014   CLINICAL DATA:  Pulmonary edema.  EXAM: PORTABLE CHEST - 1 VIEW  COMPARISON:  10/20/2014.  FINDINGS: Mediastinum and hilar structures are stable. Persistent cardiomegaly. Persistent but improving bilateral pulmonary infiltrates/ edema. Interim clearing of pleural effusions. No pneumothorax.  IMPRESSION: Congestive heart failure with pulmonary edema. Interim improvement from prior exam.   Electronically Signed   By: Maisie Fus  Register   On: 10/21/2014 08:10   Dg Chest Port 1 View  10/20/2014   CLINICAL DATA:  A vomiting, pancytopenia, history of CVA, possible pneumonia.  EXAM: PORTABLE CHEST - 1 VIEW  COMPARISON:  None.  FINDINGS: The lungs are well-expanded. The interstitial markings are increased diffusely. There are bilateral pleural effusions and a partially included in the field of view. The cardiac silhouette is enlarged. The  pulmonary vascularity is indistinct. The bones are osteopenic.  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 Bibasilar atelectasis and/or small pleural effusions are present.  When the patient can tolerate the procedure, a PA and lateral chest x-ray would be useful.   Electronically Signed   By: David  [place]   On: 10/20/2014 15:52   Dg Swallowing Func-speech Pathology  10/21/2014    Objective Swallowing Evaluation:   Completed by Leonia Corona, SLP  Student, Supervised and reviewed by Harlon Ditty MA CCC-SLP  Patient Details  Name: Sharon [name] MRN: [id number] Date of Birth: 06/04/1946  Today's Date: 10/21/2014 Time: SLP Start Time (ACUTE ONLY): 1137-SLP Stop Time (ACUTE ONLY): 1155 SLP Time Calculation (min) (ACUTE ONLY): 18 min  Past Medical History:  Past Medical History  Diagnosis Date  . Bipolar 1 disorder   . Dementia   . Other pancytopenia 06/03/2014  . Stroke    Past Surgical History:  Past Surgical History  Procedure Laterality Date  . Cholecystectomy     HPI:  HPI: Pt is a 69 y/o female with PMH of advanced dementia and bipolar  disorder who came in to [facility] following a fall at home and seizure  activity.  Ct revealed hemorrhagic contusion in the R cerebellum. Pt has no prior hx  of dysphagia.  No Data Recorded  Assessment / Plan / Recommendation CHL IP CLINICAL IMPRESSIONS 10/21/2014  Dysphagia Diagnosis Mild oral phase dysphagia;Moderate oral phase  dysphagia  Clinical impression Pt presents with moderate oral and moderate pharyngeal  phase dysphagia characterized by slow oral transit, base of tongue  weakness and delayed swallow initiation to the pyriforms.Pt aspirated with  thin and nectar thick liquids during the swallow. Sensation intermittent.  Mild-moderate amount of apirate ; not expelled despite cue to cough. No  aspiration observed with teaspoon amounts of honey-thick liquids. Pt still  at risk for aspiration given delayed swallow initiation, reduced  airway  protection and reduced sensation. Recommend pt initiate dys 1/ honey thick  liquid diet; only teaspoon amounts of honey; meds whole in puree. Speech  will follow-up with diet tolerance.       CHL IP TREATMENT RECOMMENDATION 10/21/2014  Treatment Plan Recommendations Therapy as outlined in treatment plan below      CHL IP DIET RECOMMENDATION 10/21/2014  Diet Recommendations Dysphagia 1 (Puree);Honey-thick liquid  Liquid Administration via Spoon  Medication Administration Whole meds with puree  Compensations Slow rate;Small sips/bites  Postural Changes and/or Swallow Maneuvers Seated upright 90 degrees     CHL IP OTHER RECOMMENDATIONS 10/21/2014  Recommended Consults (None)  Oral Care Recommendations Oral care Q4 per protocol  Other Recommendations Order thickener from pharmacy     CHL IP FOLLOW UP RECOMMENDATIONS 10/21/2014  Follow up Recommendations Inpatient Rehab     CHL IP FREQUENCY AND DURATION 10/21/2014  Speech Therapy Frequency (ACUTE ONLY) min 3x week  Treatment Duration 2 weeks     Pertinent Vitals/Pain NA    SLP Swallow Goals No flowsheet data found.  No flowsheet data found.    CHL IP REASON FOR REFERRAL 10/21/2014  Reason for Referral Objectively evaluate swallowing function     CHL IP ORAL PHASE 10/21/2014  Lips (None)  Tongue (None)  Mucous membranes (None)  Nutritional status (None)  Other (None)  Oxygen therapy (None)  Oral Phase Impaired  Oral - Pudding Teaspoon (None)  Oral - Pudding Cup (None)  Oral - Honey Teaspoon Delayed oral transit  Oral - Honey Cup Delayed oral transit  Oral - Honey Syringe (None)  Oral - Nectar Teaspoon (None)  Oral - Nectar Cup (None)  Oral - Nectar Straw Delayed oral transit  Oral - Nectar Syringe (None)  Oral - Ice Chips (None)  Oral - Thin Teaspoon Other (Comment)  Oral - Thin Cup Other (Comment)  Oral - Thin Straw Other (Comment)  Oral - Thin Syringe (None)  Oral - Puree Delayed oral transit  Oral - Mechanical Soft (None)  Oral - Regular Delayed oral transit  Oral -  Multi-consistency (None)  Oral - Pill (None)  Oral Phase - Comment (None)      CHL IP PHARYNGEAL PHASE 10/21/2014  Pharyngeal Phase Impaired  Pharyngeal - Pudding Teaspoon (None)  Penetration/Aspiration details (pudding teaspoon) (None)  Pharyngeal - Pudding Cup (None)  Penetration/Aspiration details (pudding cup) (None)  Pharyngeal - Honey Teaspoon Delayed swallow initiation;Premature spillage  to pyriform sinuses  Penetration/Aspiration details (honey teaspoon) (None)  Pharyngeal - Honey Cup Delayed swallow initiation;Premature spillage to  pyriform sinuses  Penetration/Aspiration details (honey cup) (None)  Pharyngeal - Honey Syringe (None)  Penetration/Aspiration details (honey syringe) (None)  Pharyngeal - Nectar Teaspoon (None)  Penetration/Aspiration details (nectar teaspoon) (None)  Pharyngeal - Nectar Cup Penetration/Aspiration before  swallow  Penetration/Aspiration details (nectar cup) Material enters airway, passes  BELOW cords and not ejected out despite cough attempt by patient  Pharyngeal - Nectar Straw Delayed swallow initiation;Premature spillage to  pyriform sinuses;Penetration/Aspiration before swallow  Penetration/Aspiration details (nectar straw) Material enters airway,  passes BELOW cords and not ejected out despite cough attempt by patient  Pharyngeal - Nectar Syringe (None)  Penetration/Aspiration details (nectar syringe) (None)  Pharyngeal - Ice Chips (None)  Penetration/Aspiration details (ice chips) (None)  Pharyngeal - Thin Teaspoon Delayed swallow initiation;Premature spillage  to pyriform sinuses;Penetration/Aspiration before swallow  Penetration/Aspiration details (thin teaspoon) Material enters airway,  passes BELOW cords and not ejected out despite cough attempt by patient  Pharyngeal - Thin Cup Delayed swallow initiation;Premature spillage to  pyriform sinuses;Penetration/Aspiration before swallow  Penetration/Aspiration details (thin cup) Material enters airway, passes  BELOW cords  and not ejected out despite cough attempt by patient  Pharyngeal - Thin Straw Delayed swallow initiation;Premature spillage to  pyriform sinuses;Penetration/Aspiration before swallow  Penetration/Aspiration details (thin straw) Material enters airway, passes  BELOW cords and not ejected out despite cough attempt by patient  Pharyngeal - Thin Syringe (None)  Penetration/Aspiration details (thin syringe') (None)  Pharyngeal - Puree Delayed swallow initiation;Premature spillage to  valleculae;Pharyngeal residue - valleculae  Penetration/Aspiration details (puree) (None)  Pharyngeal - Mechanical Soft (None)  Penetration/Aspiration details (mechanical soft) (None)  Pharyngeal - Regular Delayed swallow initiation;Premature spillage to  valleculae;Pharyngeal residue - valleculae  Penetration/Aspiration details (regular) (None)  Pharyngeal - Multi-consistency (None)  Penetration/Aspiration details (multi-consistency) (None)  Pharyngeal - Pill Delayed swallow initiation;Premature spillage to  valleculae  Penetration/Aspiration details (pill) (None)  Pharyngeal Comment (None)     CHL IP CERVICAL ESOPHAGEAL PHASE 10/21/2014  Cervical Esophageal Phase WFL  Pudding Teaspoon (None)  Pudding Cup (None)  Honey Teaspoon (None)  Honey Cup (None)  Honey Syringe (None)  Nectar Teaspoon (None)  Nectar Cup (None)  Nectar Straw (None)  Nectar Syringe (None)  Thin Teaspoon (None)  Thin Cup (None)  Thin Straw (None)  Thin Syringe (None)  Cervical Esophageal Comment (None)    No flowsheet data found.         [place], Riley NearingBonnie Caroline 10/21/2014, 2:00 PM       Assessment/Plan: Diagnosis: TBI 1. Does the need for close, 24 hr/day medical supervision in concert with the patient's rehab needs make it unreasonable for this patient to be served in a less intensive setting? Yes 2. Co-Morbidities requiring supervision/potential complications:  See above 3. Due to bladder management, bowel management, safety, skin/wound care, disease management,  medication administration, pain management and patient education, does the patient require 24 hr/day rehab nursing? Yes 4. Does the patient require coordinated care of a physician, rehab nurse, PT (1-2 hrs/day, 5 days/week), OT (1-2 hrs/day, 5 days/week) and SLP (1-2 hrs/day, 5 days/week) to address physical and functional deficits in the context of the above medical diagnosis(es)? Yes Addressing deficits in the following areas: balance, endurance, locomotion, strength, transferring, bowel/bladder control, bathing, dressing, feeding, grooming, toileting, cognition, speech, language and psychosocial support 5. Can the patient actively participate in an intensive therapy program of at least 3 hrs of therapy per day at least 5 days per week? Yes 6. The potential for patient to make measurable gains while on inpatient rehab is excellent 7. Anticipated functional outcomes upon discharge from inpatient rehab are supervision  with PT, supervision with OT, supervision and min assist with SLP. 8. Estimated rehab length of stay to reach the above functional goals is: 13-18  days 9. Does the patient have adequate social supports and living environment to accommodate these discharge functional goals? Yes and Potentially 10. Anticipated D/C setting: Home 11. Anticipated post D/C treatments: HH therapy 12. Overall Rehab/Functional Prognosis: excellent  RECOMMENDATIONS: This patient's condition is appropriate for continued rehabilitative care in the following setting: CIR Patient has agreed to participate in recommended program. Potentially Note that insurance prior authorization may be required for reimbursement for recommended care.  Comment: Rehab Admissions Coordinator to follow up.  Thanks,  Ranelle Oyster, MD, [place]     10/22/2014

## 2014-10-22 NOTE — Clinical Social Work Note (Signed)
CSW met the pt's husband Roxy Manns at the bedside. CSW provided bed offers. Roxy Manns reported he would like the pt to transition to [facility]. Roxy Manns reported that he need to discuss his decision with his son. CSW will continue to follow and assist with discharge.   [place], MSW, [place]

## 2014-10-23 LAB — BASIC METABOLIC PANEL
ANION GAP: 11 (ref 5–15)
BUN: 14 mg/dL (ref 6–23)
CALCIUM: 8.7 mg/dL (ref 8.4–10.5)
CO2: 21 mmol/L (ref 19–32)
Chloride: 106 mmol/L (ref 96–112)
Creatinine, Ser: 0.7 mg/dL (ref 0.50–1.10)
GFR calc non Af Amer: 86 mL/min — ABNORMAL LOW (ref >90)
Glucose, Bld: 184 mg/dL — ABNORMAL HIGH (ref 70–99)
Potassium: 4.2 mmol/L (ref 3.5–5.1)
Sodium: 138 mmol/L (ref 135–145)

## 2014-10-23 MED ORDER — LISINOPRIL 2.5 MG PO TABS
2.5000 mg | ORAL_TABLET | Freq: Every day | ORAL | Status: DC
  Administered 2014-10-23 – 2014-10-25 (×3): 2.5 mg via ORAL
  Filled 2014-10-23 (×3): qty 1

## 2014-10-23 MED ORDER — FUROSEMIDE 20 MG PO TABS
20.0000 mg | ORAL_TABLET | Freq: Every day | ORAL | Status: DC
  Administered 2014-10-23 – 2014-10-25 (×3): 20 mg via ORAL
  Filled 2014-10-23 (×3): qty 1

## 2014-10-23 NOTE — Progress Notes (Signed)
[facility] PROGRESS NOTE  Sharon [name] ZOX:[id number] DOB: 1946/04/04 DOA: 10/14/2014 PCP: Gwen Pounds, MD Interim summary: 69 year old lady admitted after a fall.  Assessment/Plan: 1. Acute respiratory failure with hypoxia from pulmonary edema: Resolved with lasix. She is off oxygen with good sats on RA. Her echocardiogram revealed EF of 40 to 45% with diffuse hypokinesis. Will start her on low dose lasix and watch her electrolytes.    Chronic systolic heart failure: She appears to be euvolemic . On low dose lasix. Will add lisinopril. Outpatient follow up with cardiology.  Dysphagia: Follow upwith SLP.    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 She appears comfortable but very fidgety.    Anemia of chronic disease: Stable.   PT recommended inpatient rehab.  Awaiting rehab consult recommendations.    UTI e coli on cultures.  On bactrim.    Hypokalemia : repleted as needed. Repeat level is normal.     Code Status: full code.  Family Communication:discussed with husband over the phone.  Disposition Plan: CIR vs SNF.    Consultants:  Neurology  Neuro surgery.   Procedures:  none  Antibiotics:  Bactrim.   HPI/Subjective: Comfortable no complaints.   Objective: Filed Vitals:   10/23/14 1258  BP: 151/71  Pulse: 85  Temp: 98.7 F (37.1 C)  Resp: 20    Intake/Output Summary (Last 24 hours) at 10/23/14 1649 Last data filed at 10/23/14 1232  Gross per 24 hour  Intake    960 ml  Output      0 ml  Net    960 ml   Filed Weights   10/21/14 0600 10/22/14 0500 10/23/14 0357  Weight: 53.7 kg (118 lb 6.2 oz) 49.442 kg (109 lb) 53.343 kg (117 lb 9.6 oz)    Exam:   General:  Alert afebrile comfortable  Cardiovascular: s1s2  Respiratory: ctab  Abdomen: soft non tender non distended bowel sounds heard  Musculoskeletal: no pedal edema.   Data Reviewed: Basic Metabolic  Panel:  Recent Labs Lab 10/18/14 1141 10/18/14 1230 10/18/14 1459 10/19/14 0140 10/20/14 0550 10/22/14 0612 10/23/14 1034  NA 147*  --   --  146* 146* 142 138  K 3.4*  --   --  3.4* 3.9 3.2* 4.2  CL 119*  --   --  118* 117* 104 106  CO2 18*  --   --  18* 22 32 21  GLUCOSE 135*  --   --  163* 138* 151* 184*  BUN 16  --   --  CREATININE 0.69  --   --  0.66 0.57 0.66 0.70  CALCIUM 8.1*  --   --  8.2* 8.2* 8.0* 8.7  MG  --  1.9 1.9 1.8 1.8 1.6  --   PHOS  --   --  3.3 2.7 2.7  --   --    Liver Function Tests: No results for input(s): AST, ALT, ALKPHOS, BILITOT, PROT, ALBUMIN in the last 168 hours. No results for input(s): LIPASE, AMYLASE in the last 168 hours. No results for input(s): AMMONIA in the last 168 hours. CBC:  Recent Labs Lab 10/18/14 1141 10/19/14 0140 10/20/14 0550 10/22/14 0612  WBC 2.8* 3.4* 6.7 2.8*  HGB 8.2* 7.9* 8.2* 8.8*  HCT 24.5* 23.1* 24.6* 26.7*  MCV 86.9 85.9 86.6 87.0  PLT 96* 109* 175 144*   Cardiac Enzymes: No results for input(s):  CKTOTAL, CKMB, CKMBINDEX, TROPONINI in the last 168 hours. BNP (last 3 results) No results for input(s): BNP in the last 8760 hours.  ProBNP (last 3 results) No results for input(s): PROBNP in the last 8760 hours.  CBG:  Recent Labs Lab 10/20/14 1131 10/20/14 1651 10/20/14 2000 10/20/14 2318 10/21/14 0329  GLUCAP 174* 154* 173* 157* 171*    Recent Results (from the past 240 hour(s))  Blood culture (routine x 2)     Status: None   Collection Time: 10/14/14 10:15 AM  Result Value Ref Range Status   Specimen Description BLOOD LEFT HAND  Final   Special Requests BOTTLES DRAWN AEROBIC AND ANAEROBIC  Final   Culture   Final    NO GROWTH 5 DAYS Performed at [facility]    Report Status 10/20/2014 FINAL  Final  Blood culture (routine x 2)     Status: None   Collection Time: 10/14/14 10:30 AM  Result Value Ref Range Status   Specimen Description BLOOD LEFT HAND  Final    Special Requests BOTTLES DRAWN AEROBIC AND ANAEROBIC  Final   Culture   Final    NO GROWTH 5 DAYS Performed at [facility]    Report Status 10/20/2014 FINAL  Final  Urine culture     Status: None   Collection Time: 10/14/14 11:05 AM  Result Value Ref Range Status   Specimen Description URINE, CATHETERIZED  Final   Special Requests NONE  Final   Colony Count   Final    >=100,000 COLONIES/ML Performed at [facility]    Culture   Final    ESCHERICHIA COLI Performed at [facility]    Report Status 10/16/2014 FINAL  Final   Organism ID, Bacteria ESCHERICHIA COLI  Final      Susceptibility   Escherichia coli - MIC*    AMPICILLIN 4 SENSITIVE Sensitive     CEFAZOLIN <=4 SENSITIVE Sensitive     CEFTRIAXONE <=1 SENSITIVE Sensitive     CIPROFLOXACIN <=0.25 SENSITIVE Sensitive     GENTAMICIN <=1 SENSITIVE Sensitive     LEVOFLOXACIN <=0.12 SENSITIVE Sensitive     NITROFURANTOIN <=16 SENSITIVE Sensitive     TOBRAMYCIN <=1 SENSITIVE Sensitive     TRIMETH/SULFA <=20 SENSITIVE Sensitive     PIP/TAZO <=4 SENSITIVE Sensitive     * ESCHERICHIA COLI  MRSA PCR Screening     Status: None   Collection Time: 10/14/14  4:53 PM  Result Value Ref Range Status   MRSA by PCR NEGATIVE NEGATIVE Final    Comment:        The GeneXpert MRSA Assay (FDA approved for NASAL specimens only), is one component of a comprehensive MRSA colonization surveillance program. It is not intended to diagnose MRSA infection nor to guide or monitor treatment for MRSA infections.      Studies: No results found.  Scheduled Meds: . antiseptic oral rinse  7 mL Mouth Rinse BID  . cholecalciferol  1,000 Units Oral QHS  . citalopram  20 mg Oral QHS  . cyanocobalamin  1,000 mcg Oral QHS  . darifenacin  7.5 mg Oral Daily  . feeding supplement (ENSURE ENLIVE)  237 mL Oral BID BM  . ferrous sulfate  300 mg Oral BID WC  . furosemide  20 mg Oral Daily  . levETIRAcetam  500 mg Oral BID  .  pantoprazole sodium  40 mg Per Tube Q1200  . QUEtiapine  300 mg Oral BID  . tamsulosin  0.4 mg Oral QHS  . zinc sulfate  220 mg Oral Daily   Continuous Infusions: . sodium chloride 0.9 % 1,000 mL infusion 10 mL/hr at 10/20/14 2136    Active Problems:   Intracranial hemorrhage   Intracerebral hemorrhage   Hypoxemia   Pulmonary edema   Frontal lobe contusion    Time spent: 25 minutes     [facility] LPKULA,Lakelynn Severtson  [facility] Pager [phone]. If 7PM-7AM, please contact night-coverage at [email or web address], password [email or web address] 10/23/2014, 4:49 PM  LOS: 9 days

## 2014-10-23 NOTE — Progress Notes (Signed)
No issues overnight. Pt is relatively lucid, does c/o some HA.  EXAM:  BP 133/60 mmHg  Pulse 84  Temp(Src) 98.2 F (36.8 C) (Oral)  Resp 24  Ht 5\' 4"  (1.626 m)  Wt 53.343 kg (117 lb 9.6 oz)  BMI 20.18 kg/m2  SpO2 94%  Awake, alert, oriented to person, hospital, not year CN grossly intact  5/5 BUE/BLE   IMPRESSION:  69 y.o. female s/p fall with multiple brain contusions, but slowly improving mental status.  PLAN: - Cont current mgmt - Will need to continue Keppra

## 2014-10-24 MED ORDER — MORPHINE SULFATE 2 MG/ML IJ SOLN
1.0000 mg | Freq: Once | INTRAMUSCULAR | Status: AC
  Administered 2014-10-25: 1 mg via INTRAVENOUS
  Filled 2014-10-24: qty 1

## 2014-10-24 NOTE — Progress Notes (Addendum)
Shift Event: Notified that pt is complaining of mid sternal chest pain. EKG obtained shows SR, PAC with bigeminy. Unable to delinate other characteristics of the chest pain as pt w/ hx of advanced dementia and bipolar disorder. Ordered morphine 1mg  X1. Stat Troponin 0.05, consistent with previous 0.06 from 6 days ago. Will continue to cycle CE.   Update: RN states pt did not require morphine as chest pain has resolved, pt now asleep.   Illa LevelSahar Orenthal Debski [facility]

## 2014-10-24 NOTE — Progress Notes (Signed)
Patient ID: Sharon [name], female   DOB: 19-Mar-1946, 69 y.o.   MRN: [id number] Neuro stable.no complains.

## 2014-10-24 NOTE — Progress Notes (Signed)
[facility] PROGRESS NOTE  Sharon [name]:[id number] DOB: June 02, 1946 DOA: 10/14/2014 PCP: Gwen Pounds, MD Interim summary: 69 year old lady admitted after a fall.  Assessment/Plan: 1. Acute respiratory failure with hypoxia from pulmonary edema: Resolved with lasix. She is off oxygen with good sats on RA. Her echocardiogram revealed EF of 40 to 45% with diffuse hypokinesis. Will start her on low dose lasix and watch her electrolytes.    Chronic systolic heart failure: She appears to be euvolemic . On low dose lasix. Will add lisinopril. Outpatient follow up with cardiology.  Dysphagia: Follow upwith SLP on discharge.    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 She appears comfortable . Neuro surgery on board, no new complaints.    Anemia of chronic disease: Stable.   PT recommended inpatient rehab.  Awaiting rehab consult recommendations.    UTI e coli on cultures.  On bactrim to complete the course.    Hypokalemia : repleted as needed. Repeat level is normal.  Repeat in am.    Code Status: full code.  Family Communication:discussed with husband over the phone.  Disposition Plan: CIR vs SNF.    Consultants:  Neurology  Neuro surgery.   Procedures:  none  Antibiotics:  Bactrim.   HPI/Subjective: Comfortable no complaints.   Objective: Filed Vitals:   10/24/14 1330  BP: 100/89  Pulse: 87  Temp: 97.7 F (36.5 C)  Resp: 20    Intake/Output Summary (Last 24 hours) at 10/24/14 1745 Last data filed at 10/24/14 1300  Gross per 24 hour  Intake    480 ml  Output      0 ml  Net    480 ml   Filed Weights   10/22/14 0500 10/23/14 0357 10/24/14 0639  Weight: 49.442 kg (109 lb) 53.343 kg (117 lb 9.6 oz) 52.39 kg (115 lb 8 oz)    Exam:   General:  Alert afebrile comfortable  Cardiovascular: s1s2  Respiratory: ctab  Abdomen: soft non tender non distended bowel sounds  heard  Musculoskeletal: no pedal edema.   Data Reviewed: Basic Metabolic Panel:  Recent Labs Lab 10/18/14 1141 10/18/14 1230 10/18/14 1459 10/19/14 0140 10/20/14 0550 10/22/14 0612 10/23/14 1034  NA 147*  --   --  146* 146* 142 138  K 3.4*  --   --  3.4* 3.9 3.2* 4.2  CL 119*  --   --  118* 117* 104 106  CO2 18*  --   --  18* 22 32 21  GLUCOSE 135*  --   --  163* 138* 151* 184*  BUN 16  --   --  CREATININE 0.69  --   --  0.66 0.57 0.66 0.70  CALCIUM 8.1*  --   --  8.2* 8.2* 8.0* 8.7  MG  --  1.9 1.9 1.8 1.8 1.6  --   PHOS  --   --  3.3 2.7 2.7  --   --    Liver Function Tests: No results for input(s): AST, ALT, ALKPHOS, BILITOT, PROT, ALBUMIN in the last 168 hours. No results for input(s): LIPASE, AMYLASE in the last 168 hours. No results for input(s): AMMONIA in the last 168 hours. CBC:  Recent Labs Lab 10/18/14 1141 10/19/14 0140 10/20/14 0550 10/22/14 0612  WBC 2.8* 3.4* 6.7 2.8*  HGB 8.2* 7.9* 8.2* 8.8*  HCT 24.5* 23.1* 24.6* 26.7*  MCV 86.9 85.9 86.6 87.0  PLT 96* 109* 175 144*   Cardiac Enzymes: No results for input(s): CKTOTAL, CKMB, CKMBINDEX, TROPONINI in the last 168 hours. BNP (last 3 results) No results for input(s): BNP in the last 8760 hours.  ProBNP (last 3 results) No results for input(s): PROBNP in the last 8760 hours.  CBG:  Recent Labs Lab 10/20/14 1131 10/20/14 1651 10/20/14 2000 10/20/14 2318 10/21/14 0329  GLUCAP 174* 154* 173* 157* 171*    No results found for this or any previous visit (from the past 240 hour(s)).   Studies: No results found.  Scheduled Meds: . antiseptic oral rinse  7 mL Mouth Rinse BID  . cholecalciferol  1,000 Units Oral QHS  . citalopram  20 mg Oral QHS  . cyanocobalamin  1,000 mcg Oral QHS  . darifenacin  7.5 mg Oral Daily  . feeding supplement (ENSURE ENLIVE)  237 mL Oral BID BM  . ferrous sulfate  300 mg Oral BID WC  . furosemide  20 mg Oral Daily  . levETIRAcetam  500 mg Oral  BID  . lisinopril  2.5 mg Oral Daily  . pantoprazole sodium  40 mg Per Tube Q1200  . QUEtiapine  300 mg Oral BID  . tamsulosin  0.4 mg Oral QHS  . zinc sulfate  220 mg Oral Daily   Continuous Infusions: . sodium chloride 0.9 % 1,000 mL infusion 10 mL/hr at 10/20/14 2136    Active Problems:   Intracranial hemorrhage   Intracerebral hemorrhage   Hypoxemia   Pulmonary edema   Frontal lobe contusion    Time spent: 15 minutes     [facility]KULA,Judea Fennimore  [facility] Pager [phone]. If 7PM-7AM, please contact night-coverage at [email or web address], password [email or web address] 10/24/2014, 5:45 PM  LOS: 10 days

## 2014-10-25 ENCOUNTER — Inpatient Hospital Stay (HOSPITAL_COMMUNITY)
Admission: RE | Admit: 2014-10-25 | Discharge: 2014-11-16 | DRG: 945 | Disposition: A | Discharge status: Skilled Nursing Facility | Payer: Medicare Other | Source: Intra-hospital | Attending: Physical Medicine & Rehabilitation | Admitting: Physical Medicine & Rehabilitation

## 2014-10-25 DIAGNOSIS — N390 Urinary tract infection, site not specified: Secondary | ICD-10-CM | POA: Diagnosis not present

## 2014-10-25 DIAGNOSIS — R339 Retention of urine, unspecified: Secondary | ICD-10-CM | POA: Diagnosis not present

## 2014-10-25 DIAGNOSIS — R350 Frequency of micturition: Secondary | ICD-10-CM | POA: Diagnosis present

## 2014-10-25 DIAGNOSIS — S06314S Contusion and laceration of right cerebrum with loss of consciousness of 6 hours to 24 hours, sequela: Secondary | ICD-10-CM

## 2014-10-25 DIAGNOSIS — S0633AA Contusion and laceration of cerebrum, unspecified, with loss of consciousness status unknown, initial encounter: Secondary | ICD-10-CM | POA: Diagnosis present

## 2014-10-25 DIAGNOSIS — S06339A Contusion and laceration of cerebrum, unspecified, with loss of consciousness of unspecified duration, initial encounter: Secondary | ICD-10-CM | POA: Diagnosis present

## 2014-10-25 DIAGNOSIS — R1314 Dysphagia, pharyngoesophageal phase: Secondary | ICD-10-CM

## 2014-10-25 DIAGNOSIS — A499 Bacterial infection, unspecified: Secondary | ICD-10-CM | POA: Diagnosis not present

## 2014-10-25 DIAGNOSIS — S069X9A Unspecified intracranial injury with loss of consciousness of unspecified duration, initial encounter: Secondary | ICD-10-CM | POA: Diagnosis not present

## 2014-10-25 DIAGNOSIS — F0390 Unspecified dementia without behavioral disturbance: Secondary | ICD-10-CM | POA: Diagnosis present

## 2014-10-25 DIAGNOSIS — W19XXXD Unspecified fall, subsequent encounter: Secondary | ICD-10-CM | POA: Diagnosis present

## 2014-10-25 DIAGNOSIS — S06370D Contusion, laceration, and hemorrhage of cerebellum without loss of consciousness, subsequent encounter: Secondary | ICD-10-CM | POA: Diagnosis present

## 2014-10-25 DIAGNOSIS — I952 Hypotension due to drugs: Secondary | ICD-10-CM | POA: Diagnosis not present

## 2014-10-25 DIAGNOSIS — I10 Essential (primary) hypertension: Secondary | ICD-10-CM | POA: Diagnosis present

## 2014-10-25 DIAGNOSIS — I951 Orthostatic hypotension: Secondary | ICD-10-CM | POA: Diagnosis not present

## 2014-10-25 DIAGNOSIS — R561 Post traumatic seizures: Secondary | ICD-10-CM | POA: Diagnosis present

## 2014-10-25 DIAGNOSIS — S0291XA Unspecified fracture of skull, initial encounter for closed fracture: Secondary | ICD-10-CM | POA: Diagnosis present

## 2014-10-25 DIAGNOSIS — I5022 Chronic systolic (congestive) heart failure: Secondary | ICD-10-CM | POA: Diagnosis present

## 2014-10-25 DIAGNOSIS — R1310 Dysphagia, unspecified: Secondary | ICD-10-CM | POA: Diagnosis present

## 2014-10-25 DIAGNOSIS — R4587 Impulsiveness: Secondary | ICD-10-CM | POA: Diagnosis present

## 2014-10-25 DIAGNOSIS — S06311A Contusion and laceration of right cerebrum with loss of consciousness of 30 minutes or less, initial encounter: Secondary | ICD-10-CM | POA: Diagnosis not present

## 2014-10-25 DIAGNOSIS — F319 Bipolar disorder, unspecified: Secondary | ICD-10-CM | POA: Diagnosis present

## 2014-10-25 DIAGNOSIS — S06324S Contusion and laceration of left cerebrum with loss of consciousness of 6 hours to 24 hours, sequela: Secondary | ICD-10-CM | POA: Diagnosis not present

## 2014-10-25 DIAGNOSIS — S06334S Contusion and laceration of cerebrum, unspecified, with loss of consciousness of 6 hours to 24 hours, sequela: Secondary | ICD-10-CM | POA: Diagnosis not present

## 2014-10-25 DIAGNOSIS — Z87820 Personal history of traumatic brain injury: Secondary | ICD-10-CM | POA: Diagnosis present

## 2014-10-25 LAB — BASIC METABOLIC PANEL
Anion gap: 9 (ref 5–15)
BUN: 16 mg/dL (ref 6–23)
CO2: 23 mmol/L (ref 19–32)
Calcium: 8.7 mg/dL (ref 8.4–10.5)
Chloride: 101 mmol/L (ref 96–112)
Creatinine, Ser: 0.6 mg/dL (ref 0.50–1.10)
Glucose, Bld: 158 mg/dL — ABNORMAL HIGH (ref 70–99)
POTASSIUM: 4.1 mmol/L (ref 3.5–5.1)
SODIUM: 133 mmol/L — AB (ref 135–145)

## 2014-10-25 LAB — CBC
HEMATOCRIT: 28.7 % — AB (ref 36.0–46.0)
HEMOGLOBIN: 9.3 g/dL — AB (ref 12.0–15.0)
MCH: 27.6 pg (ref 26.0–34.0)
MCHC: 32.4 g/dL (ref 30.0–36.0)
MCV: 85.2 fL (ref 78.0–100.0)
Platelets: 167 10*3/uL (ref 150–400)
RBC: 3.37 MIL/uL — AB (ref 3.87–5.11)
RDW: 14.1 % (ref 11.5–15.5)
WBC: 5.2 10*3/uL (ref 4.0–10.5)

## 2014-10-25 LAB — TROPONIN I
TROPONIN I: 0.04 ng/mL — AB (ref <0.031)
Troponin I: 0.05 ng/mL — ABNORMAL HIGH (ref <0.031)
Troponin I: 0.04 ng/mL — ABNORMAL HIGH (ref <0.031)

## 2014-10-25 MED ORDER — IPRATROPIUM-ALBUTEROL 0.5-2.5 (3) MG/3ML IN SOLN: 3.0000 mL | RESPIRATORY_TRACT | Status: DC | PRN

## 2014-10-25 MED ORDER — VITAMIN B-12 1000 MCG PO TABS
1000.0000 ug | ORAL_TABLET | Freq: Every day | ORAL | Status: DC
  Administered 2014-10-26 – 2014-11-15 (×21): 1000 ug via ORAL
  Filled 2014-10-25 (×23): qty 1

## 2014-10-25 MED ORDER — LEVETIRACETAM 500 MG PO TABS
500.0000 mg | ORAL_TABLET | Freq: Two times a day (BID) | ORAL | Status: DC
  Administered 2014-10-26 – 2014-11-16 (×43): 500 mg via ORAL
  Filled 2014-10-25 (×46): qty 1

## 2014-10-25 MED ORDER — SORBITOL 70 % SOLN
30.0000 mL | Freq: Every day | Status: DC | PRN
  Filled 2014-10-25: qty 30

## 2014-10-25 MED ORDER — TAMSULOSIN HCL 0.4 MG PO CAPS
0.4000 mg | ORAL_CAPSULE | Freq: Every day | ORAL | Status: DC
  Administered 2014-10-26 – 2014-10-29 (×4): 0.4 mg via ORAL
  Filled 2014-10-25 (×5): qty 1

## 2014-10-25 MED ORDER — WHITE PETROLATUM GEL: 1.0000 "application " | Status: DC | PRN

## 2014-10-25 MED ORDER — ACETAMINOPHEN 325 MG PO TABS
325.0000 mg | ORAL_TABLET | ORAL | Status: DC | PRN
  Administered 2014-10-28 – 2014-11-15 (×18): 650 mg via ORAL
  Filled 2014-10-25 (×20): qty 2

## 2014-10-25 MED ORDER — QUETIAPINE FUMARATE 50 MG PO TABS
150.0000 mg | ORAL_TABLET | Freq: Two times a day (BID) | ORAL | Status: DC
  Administered 2014-10-25: 150 mg via ORAL
  Filled 2014-10-25: qty 3

## 2014-10-25 MED ORDER — RESOURCE THICKENUP CLEAR PO POWD
ORAL | Status: DC | PRN
  Filled 2014-10-25: qty 125

## 2014-10-25 MED ORDER — FERROUS SULFATE 300 (60 FE) MG/5ML PO SYRP
300.0000 mg | ORAL_SOLUTION | Freq: Two times a day (BID) | ORAL | Status: DC
  Administered 2014-10-26 – 2014-10-27 (×3): 300 mg via ORAL
  Filled 2014-10-25 (×5): qty 5

## 2014-10-25 MED ORDER — PANTOPRAZOLE SODIUM 40 MG PO PACK
40.0000 mg | PACK | Freq: Every day | ORAL | Status: DC
  Administered 2014-10-26 – 2014-10-27 (×2): 40 mg
  Filled 2014-10-25 (×2): qty 20

## 2014-10-25 MED ORDER — LISINOPRIL 2.5 MG PO TABS
2.5000 mg | ORAL_TABLET | Freq: Every day | ORAL | Status: DC
  Administered 2014-10-26 – 2014-10-29 (×4): 2.5 mg via ORAL
  Filled 2014-10-25 (×7): qty 1

## 2014-10-25 MED ORDER — CITALOPRAM HYDROBROMIDE 20 MG PO TABS
20.0000 mg | ORAL_TABLET | Freq: Every day | ORAL | Status: DC
  Administered 2014-10-26 – 2014-11-15 (×21): 20 mg via ORAL
  Filled 2014-10-25 (×22): qty 1

## 2014-10-25 MED ORDER — LEVETIRACETAM 500 MG PO TABS: 500.0000 mg | ORAL_TABLET | Freq: Two times a day (BID) | ORAL | Status: AC

## 2014-10-25 MED ORDER — DARIFENACIN HYDROBROMIDE ER 7.5 MG PO TB24
7.5000 mg | ORAL_TABLET | Freq: Every day | ORAL | Status: DC
  Administered 2014-10-26 – 2014-10-28 (×3): 7.5 mg via ORAL
  Filled 2014-10-25 (×5): qty 1

## 2014-10-25 MED ORDER — PANTOPRAZOLE SODIUM 40 MG PO PACK: 40.0000 mg | PACK | Freq: Every day | ORAL | Status: DC

## 2014-10-25 MED ORDER — QUETIAPINE FUMARATE 50 MG PO TABS
150.0000 mg | ORAL_TABLET | Freq: Two times a day (BID) | ORAL | Status: DC
  Administered 2014-10-25 – 2014-11-16 (×44): 150 mg via ORAL
  Filled 2014-10-25 (×47): qty 1

## 2014-10-25 MED ORDER — CETYLPYRIDINIUM CHLORIDE 0.05 % MT LIQD
7.0000 mL | Freq: Two times a day (BID) | OROMUCOSAL | Status: DC
  Administered 2014-10-26 – 2014-11-16 (×36): 7 mL via OROMUCOSAL

## 2014-10-25 MED ORDER — QUETIAPINE FUMARATE 300 MG PO TABS: 150.0000 mg | ORAL_TABLET | Freq: Two times a day (BID) | ORAL | Status: AC

## 2014-10-25 MED ORDER — FERROUS SULFATE 300 (60 FE) MG/5ML PO SYRP: 300.0000 mg | ORAL_SOLUTION | Freq: Two times a day (BID) | ORAL | Status: DC

## 2014-10-25 MED ORDER — VITAMIN D3 25 MCG (1000 UNIT) PO TABS
1000.0000 [IU] | ORAL_TABLET | Freq: Every day | ORAL | Status: DC
  Administered 2014-10-26 – 2014-11-15 (×21): 1000 [IU] via ORAL
  Filled 2014-10-25 (×22): qty 1

## 2014-10-25 MED ORDER — SODIUM CHLORIDE 0.45 % IV SOLN
INTRAVENOUS | Status: DC
  Administered 2014-10-26 – 2014-10-28 (×4): via INTRAVENOUS
  Administered 2014-10-29: 1000 mL via INTRAVENOUS
  Administered 2014-10-30 – 2014-10-31 (×2): via INTRAVENOUS
  Administered 2014-11-01 – 2014-11-03 (×2): 1000 mL via INTRAVENOUS
  Administered 2014-11-06 (×2): via INTRAVENOUS

## 2014-10-25 MED ORDER — ZINC SULFATE 220 (50 ZN) MG PO CAPS
220.0000 mg | ORAL_CAPSULE | Freq: Every day | ORAL | Status: DC
  Administered 2014-10-26 – 2014-11-16 (×22): 220 mg via ORAL
  Filled 2014-10-25 (×24): qty 1

## 2014-10-25 MED ORDER — LORAZEPAM 2 MG/ML IJ SOLN: 0.5000 mg | INTRAMUSCULAR | Status: DC | PRN

## 2014-10-25 MED ORDER — FUROSEMIDE 20 MG PO TABS: 20.0000 mg | ORAL_TABLET | Freq: Every day | ORAL | Status: DC

## 2014-10-25 MED ORDER — LISINOPRIL 2.5 MG PO TABS: 2.5000 mg | ORAL_TABLET | Freq: Every day | ORAL | Status: DC

## 2014-10-25 MED ORDER — LORAZEPAM 0.5 MG PO TABS
0.5000 mg | ORAL_TABLET | ORAL | Status: DC | PRN
  Administered 2014-11-11: 0.5 mg via ORAL
  Filled 2014-10-25: qty 1

## 2014-10-25 MED ORDER — ONDANSETRON HCL 4 MG PO TABS
4.0000 mg | ORAL_TABLET | Freq: Four times a day (QID) | ORAL | Status: DC | PRN
  Administered 2014-11-03 – 2014-11-13 (×4): 4 mg via ORAL
  Filled 2014-10-25 (×4): qty 1

## 2014-10-25 MED ORDER — ONDANSETRON HCL 4 MG/2ML IJ SOLN
4.0000 mg | Freq: Four times a day (QID) | INTRAMUSCULAR | Status: DC | PRN
  Administered 2014-11-05 – 2014-11-07 (×2): 4 mg via INTRAVENOUS
  Filled 2014-10-25 (×2): qty 2

## 2014-10-25 MED ORDER — ENSURE ENLIVE PO LIQD
237.0000 mL | Freq: Two times a day (BID) | ORAL | Status: DC
  Administered 2014-10-26 – 2014-11-02 (×4): 237 mL via ORAL

## 2014-10-25 MED ORDER — FUROSEMIDE 20 MG PO TABS
20.0000 mg | ORAL_TABLET | Freq: Every day | ORAL | Status: DC
  Administered 2014-10-26 – 2014-11-06 (×12): 20 mg via ORAL
  Filled 2014-10-25 (×14): qty 1

## 2014-10-25 NOTE — Progress Notes (Signed)
CARE MANAGEMENT NOTE 10/25/2014  Patient:  Sharon [name],Sharon [name]   Account Number:  [id number]  Date Initiated:  10/22/2014  Documentation initiated by:  ROBARGE,COURTNEY  Subjective/Objective Assessment:   Patient was admitted with ICH. Lives at home with spouse     Action/Plan:   Will follow for discharge needs pending PT/OT evals and physician orders.   Anticipated DC Date:     Anticipated DC Plan:  SKILLED NURSING FACILITY  In-house referral  Clinical Social Worker         Choice offered to / List presented to:             Status of service:   Medicare Important Message given?  YES (If response is "NO", the following Medicare IM given date fields will be blank) Date Medicare IM given:  10/19/2014 Medicare IM given by:  Lawerance SabalSWIST,Tome Wilson Date Additional Medicare IM given:  10/25/2014 Additional Medicare IM given by:  [facility]BBI[facility] Kirsten Spearing  Discharge Disposition:    Per UR Regulation:    If discussed at Long Length of Stay Meetings, dates discussed:    Comments:  4/4 IM letter provided. Lawerance Sabalebbie Verlie Hellenbrand RN BSN CM  10/19/14 10:45 IM letter provided. Lawerance Sabalebbie Iniya Matzek RN BSN CM

## 2014-10-25 NOTE — Progress Notes (Signed)
Patient arrived to Rehab unit in bed with belongings at side.  Vitals stable; no complaints of pain, patient not in distress.  No family at bedside.  Not able to complete admission at this time due to patient orientation. Will continue to monitor.

## 2014-10-25 NOTE — Progress Notes (Signed)
Rehab admissions - I met with pt and her husband this am in follow up to rehab MD consult to explain the possibility of inpatient rehab. Pt was somewhat sleepy but did smile and listen initially. Husband is in support of pursuing inpatient rehab and is very supportive to pt. He has been her primary caregiver due to baseline dementia. Further questions were answered about our rehab program and informational brochures were given.  I have reviewed case with rehab MD and and now waiting on medical clearance from Dr. Karleen Hampshire. We will consider possible inpatient rehab admit later today pending her medical clearance.  I will follow up as I hear from MD. Thanks.  Nanetta Batty, PT Rehabilitation Admissions Coordinator [phone]

## 2014-10-25 NOTE — H&P (Signed)
Physical Medicine and Rehabilitation Admission H&P    Chief Complaint  Patient presents with  . Fall  . Seizures  : HPI: Sharon [name] is a 69 y.o right handed. female with history of chronic systolic congestive heart failure, dementia and bipolar disorder followed by [name] of psychiatry services. She lives with her husband and sedentary prior to admission. She was able to dress herself as well as feed herself but no meal preparation. Presented 10/14/2014 after a fall with left frontal, L temporal bleed &R cerebellar contusion, R basal skull fxs, and post traumatic seizures. Neurosurgery Dr. Joya Salm consulted with conservative care. Echocardiogram with ejection fraction of 45% no wall motion abnormalities. Hospital course also significant for UTI with antibiotics completed. Swallow study completed maintain on a dysphagia 1 honey thick liquid diet. She continues on Keppra 500 mg twice daily. Pt making progress in therapy, displaying increased activity tolerance but still very impulsive with a sitter at bedtime. It was felt that she might benefit from an inpatient rehab admission; therefore and inpatient rehab consult was requested. Patient was admitted for comprehensive rehabilitation program  Review of Systems  Gastrointestinal: Positive for constipation.  Psychiatric/Behavioral:       Bipolar disorder, dementia  All other systems reviewed and are negative.  Past Medical History  Diagnosis Date  . Bipolar 1 disorder   . Dementia   . Other pancytopenia 06/03/2014  . Stroke    Past Surgical History  Procedure Laterality Date  . Cholecystectomy     Family History  Problem Relation Age of Onset  . Cancer Mother   . Heart attack Father    Social History:  reports that she has never smoked. She has never used smokeless tobacco. She reports that she does not drink alcohol or use illicit drugs. Allergies: No Known Allergies Medications Prior to Admission  Medication Sig  Dispense Refill  . acetaminophen (TYLENOL) 500 MG tablet Take 500 mg by mouth at bedtime as needed for moderate pain.    Marland Kitchen aspirin EC 81 MG tablet Take 81 mg by mouth at bedtime.    . Cholecalciferol (VITAMIN D3) 1000 UNITS CAPS Take 1 capsule by mouth at bedtime as needed.     . citalopram (CELEXA) 40 MG tablet Take 20 mg by mouth at bedtime.   1  . Fish Oil-Cholecalciferol (FISH OIL + D3) 1200-1000 MG-UNIT CAPS Take 1,200 mg by mouth at bedtime.     Marland Kitchen FLUZONE HIGH-DOSE 0.5 ML SUSY Inject 1 each into the skin once.   0  . QUEtiapine (SEROQUEL) 300 MG tablet Take 300 mg by mouth 2 (two) times daily.     . tamsulosin (FLOMAX) 0.4 MG CAPS capsule Take 0.4 mg by mouth at bedtime.   3  . trospium (SANCTURA) 20 MG tablet Take 20 mg by mouth 2 (two) times daily.   11  . vitamin B-12 (CYANOCOBALAMIN) 500 MCG tablet Take 1,000 mcg by mouth at bedtime.    Marland Kitchen zinc sulfate 220 MG capsule Take 220 mg by mouth daily.    . ferrous sulfate 325 (65 FE) MG EC tablet Take 1 tablet (325 mg total) by mouth 2 (two) times daily after a meal. 60 tablet 3    Home: Home Living Family/patient expects to be discharged to:: Skilled nursing facility Living Arrangements: Spouse/significant other Available Help at Discharge: Family Type of Home: House Additional Comments: Pt unable to provide information related to home situationa nd available assist; We do know that she lives  with her husband, who was unavailable at time of eval; Given PMH and current functuional status, SNF for rehab is a safe, reasonable option  Lives With: Spouse   Functional History: Prior Function Level of Independence: Needs assistance Gait / Transfers Assistance Needed: independent ADL's / Homemaking Assistance Needed: Husband provided S for ADL. Husband states wife spent alot of time "rocking" in a chair./ had nights/days confused at baseline Comments: Husband states pt had "movement problems" PTA. ? dyskinesia  Functional Status:    Mobility: Bed Mobility Overal bed mobility: Needs Assistance Bed Mobility: Supine to Sit, Sit to Supine Supine to sit: Min assist Sit to supine: Min assist General bed mobility comments: min assist for initiation and positional movements, assist for LE elevation back to bed Transfers Overall transfer level: Needs assistance Equipment used:  (wrap around with gait belt) Transfers: Sit to/from Stand Sit to Stand: Mod assist, +2 physical assistance Stand pivot transfers: Mod assist, +2 physical assistance  Lateral/Scoot Transfers: Total assist, +2 safety/equipment, +2 physical assistance General transfer comment: Continues to demonstrate left lateral lean during transitional movements secondary to weakness, performed from bed, bench and toiletx3 (hygiene and pericare performed) 2 person assist for safety and stability Ambulation/Gait Ambulation/Gait assistance: Mod assist, +2 physical assistance Ambulation Distance (Feet): 150 Feet Assistive device: 2 person hand held assist (wrap around support with gait belt) Gait Pattern/deviations: Step-through pattern, Decreased stride length, Ataxic, Narrow base of support Gait velocity: decreased Gait velocity interpretation: Below normal speed for age/gender General Gait Details: significant instability and ataxic movements during gait, improved with bilateral support. LE weakness noted L>R.    ADL: ADL Overall ADL's : Needs assistance/impaired Eating/Feeding: Moderate assistance, Sitting Eating/Feeding Details (indicate cue type and reason): honey thick liquid; pt able to hold cup and bring to mouth. able to monitor sip size Grooming: Moderate assistance Grooming Details (indicate cue type and reason): Pt able to direct care, stating " I need to turn the water on", i need a towel. Weakness noted L hand during functional tasks. Upper Body Bathing: Moderate assistance Lower Body Bathing: Moderate assistance, Bed level Upper Body Dressing :  Moderate assistance Upper Body Dressing Details (indicate cue type and reason): pt assisting with reaching through armhomes. Trying to tie strings Toilet Transfer: +2 for physical assistance, Moderate assistance Toilet Transfer Details (indicate cue type and reason): for safety Toileting- Clothing Manipulation and Hygiene: Moderate assistance, Sitting/lateral lean, Sit to/from stand Toileting - Clothing Manipulation Details (indicate cue type and reason): Pt appropriately reaching for toilet paper and using R hand to complete hygiene after toileting. Approrpiately attemting to use LUE to hold clothing while copleting toileting task. Functional mobility during ADLs: Moderate assistance, +2 for physical assistance (ambulating with +2 using modified 3 musketeer and HHA) General ADL Comments: Max A at this time for ADL. Following commands with increased time. Mod A at times to maintain trunk control due to attention, lethargy and apparent movement disorder.  Cognition: Cognition Overall Cognitive Status: Impaired/Different from baseline Arousal/Alertness: Lethargic Orientation Level: Oriented to person, Oriented to place, Oriented to time Attention: Focused, Sustained Focused Attention: Impaired Focused Attention Impairment: Verbal basic, Functional basic Sustained Attention: Impaired Sustained Attention Impairment: Verbal basic, Functional basic Memory: Impaired Memory Impairment: Retrieval deficit, Decreased recall of new information Awareness: Impaired Awareness Impairment: Intellectual impairment, Emergent impairment, Anticipatory impairment Problem Solving: Impaired Problem Solving Impairment: Verbal basic, Functional basic Executive Function: Initiating Initiating: Impaired Initiating Impairment: Verbal basic, Functional basic Behaviors: Other (comment) (RN reports pt intermittently restless) Safety/Judgment: Impaired [place]  [employer] Scales of Cognitive Functioning:  Confused/appropriate Cognition Arousal/Alertness: Awake/alert Behavior During Therapy: WFL for tasks assessed/performed Overall Cognitive Status: Impaired/Different from baseline Area of Impairment: Orientation, Attention, Problem solving, Rancho level Orientation Level: Disoriented to, Place, Time Current Attention Level: Sustained Memory: Decreased recall of precautions Following Commands: Follows one step commands consistently Safety/Judgement: Decreased awareness of safety Awareness: Emergent Problem Solving: Slow processing General Comments: improve ability to sequence tasks, patient able to engage in conversation, noting need for use of bathroom, able to demonstrate improvements in awareness during functional tasks  Physical Exam: Blood pressure 117/41, pulse 69, temperature 98 F (36.7 C), temperature source Oral, resp. rate 20, height 5' 4"  (1.626 m), weight 51.302 kg (113 lb 1.6 oz), SpO2 98 %. Physical Exam  Vitals reviewed. Constitutional:  69 year old right handed frail female.  HENT:  Head: Normocephalic.  Eyes: EOM are normal. Pupils are equal, round, and reactive to light.  Negative nystagmus  Neck: Normal range of motion. Neck supple. No tracheal deviation present. No thyromegaly present.  Cardiovascular: Normal rate and regular rhythm.   No murmur heard. Respiratory: Effort normal and breath sounds normal. No respiratory distress. She has no wheezes. She has no rales.  GI: Soft. Bowel sounds are normal. She exhibits no distension. There is no tenderness. There is no rebound.  Musculoskeletal: She exhibits tenderness. She exhibits no edema.  She moves all extremities  Neurological: Coordination abnormal.  Patient is alert flat effect. She does make eye contact with examiner. She would not initiate conversation but she was able to provide her name, age and date of birth. Limited awareness of her deficits. She did follow 2 and 3 step simple commands.   Skin: Skin is  warm and dry.  Psychiatric:  Confused, limited participation in my exam    Results for orders placed or performed during the hospital encounter of 10/14/14 (from the past 48 hour(s))  Troponin I (q 6hr x 3)     Status: Abnormal   Collection Time: 10/24/14 11:28 PM  Result Value Ref Range   Troponin I 0.05 (H) <0.031 ng/mL    Comment:        PERSISTENTLY INCREASED TROPONIN VALUES IN THE RANGE OF 0.04-0.49 ng/mL CAN BE SEEN IN:       -UNSTABLE ANGINA       -CONGESTIVE HEART FAILURE       -MYOCARDITIS       -CHEST TRAUMA       -ARRYHTHMIAS       -LATE PRESENTING MYOCARDIAL INFARCTION       -COPD   CLINICAL FOLLOW-UP RECOMMENDED.   Basic metabolic panel     Status: Abnormal   Collection Time: 10/25/14  6:51 AM  Result Value Ref Range   Sodium 133 (L) 135 - 145 mmol/L   Potassium 4.1 3.5 - 5.1 mmol/L   Chloride 101 96 - 112 mmol/L   CO2 23 19 - 32 mmol/L   Glucose, Bld 158 (H) 70 - 99 mg/dL   BUN 16 6 - 23 mg/dL   Creatinine, Ser 0.60 0.50 - 1.10 mg/dL   Calcium 8.7 8.4 - 10.5 mg/dL   GFR calc non Af Amer >90 >90 mL/min   GFR calc Af Amer >90 >90 mL/min    Comment: (NOTE) The eGFR has been calculated using the CKD EPI equation. This calculation has not been validated in all clinical situations. eGFR's persistently <90 mL/min signify possible Chronic Kidney Disease.    Anion gap 9 5 - 15  CBC     Status: Abnormal   Collection Time: 10/25/14  6:51 AM  Result Value Ref Range   WBC 5.2 4.0 - 10.5 K/uL   RBC 3.37 (L) 3.87 - 5.11 MIL/uL   Hemoglobin 9.3 (L) 12.0 - 15.0 g/dL   HCT 28.7 (L) 36.0 - 46.0 %   MCV 85.2 78.0 - 100.0 fL   MCH 27.6 26.0 - 34.0 pg   MCHC 32.4 30.0 - 36.0 g/dL   RDW 14.1 11.5 - 15.5 %   Platelets 167 150 - 400 K/uL  Troponin I (q 6hr x 3)     Status: Abnormal   Collection Time: 10/25/14  6:51 AM  Result Value Ref Range   Troponin I 0.04 (H) <0.031 ng/mL    Comment:        PERSISTENTLY INCREASED TROPONIN VALUES IN THE RANGE OF 0.04-0.49 ng/mL  CAN BE SEEN IN:       -UNSTABLE ANGINA       -CONGESTIVE HEART FAILURE       -MYOCARDITIS       -CHEST TRAUMA       -ARRYHTHMIAS       -LATE PRESENTING MYOCARDIAL INFARCTION       -COPD   CLINICAL FOLLOW-UP RECOMMENDED.    No results found.     Medical Problem List and Plan: 1. Functional deficits secondary to TBI after a fall with left frontal, left temporal bleed and right cerebellar contusion as well as right basal skull fractures 2.  DVT Prophylaxis/Anticoagulation: SCDs. Monitor for any signs of DVT 3. Pain Management: Tylenol as needed 4. Mood/bipolar disorder/dementia: Celexa 20 mg daily, Seroquel 300 mg twice a day, Ativan every 4 hours as needed. Discussed baseline with husband 5. Neuropsych: This patient is not capable of making decisions on her own behalf. 6. Skin/Wound Care: Routine skin checks 7. Fluids/Electrolytes/Nutrition: Strict I and O follow-up chemistries 8. Seizure disorder. Keppra 500 mg twice a day. Monitor for any seizure activity 9. Dysphagia. Dysphagia 100 thick liquids. Monitor hydration 10. Hypertension. Lasix 20 mg daily, lisinopril 2.5 mg daily. Monitor with increased mobility 11. Urinary frequency. Enablex 0.5 mg daily, Flomax 0.4 mg daily at bedtime. Check PVR 3  Post Admission Physician Evaluation: 1. Functional deficits secondary  to TBI with skull fractures after fall. 2. Patient is admitted to receive collaborative, interdisciplinary care between the physiatrist, rehab nursing staff, and therapy team. 3. Patient's level of medical complexity and substantial therapy needs in context of that medical necessity cannot be provided at a lesser intensity of care such as a SNF. 4. Patient has experienced substantial functional loss from his/her baseline which was documented above under the "Functional History" and "Functional Status" headings.  Judging by the patient's diagnosis, physical exam, and functional history, the patient has potential for  functional progress which will result in measurable gains while on inpatient rehab.  These gains will be of substantial and practical use upon discharge  in facilitating mobility and self-care at the household level. 5. Physiatrist will provide 24 hour management of medical needs as well as oversight of the therapy plan/treatment and provide guidance as appropriate regarding the interaction of the two. 6. 24 hour rehab nursing will assist with bladder management, bowel management, safety, skin/wound care, disease management, medication administration and pain management  and help integrate therapy concepts, techniques,education, etc. 7. PT will assess and treat for/with: Lower extremity strength, range of motion, stamina, balance, functional mobility, safety, adaptive techniques and equipment, NMR, cognitive behavioral mgt.  Goals are: min to mod assist. 8. OT will assess and treat for/with: ADL's, functional mobility, safety, upper extremity strength, adaptive techniques and equipment, NMR, cognitive behavioral mgt, education.   Goals are: minto mod assist. Therapy may not proceed with showering this patient. 9. SLP will assess and treat for/with: cognition, behavior, communication, swallowing.  Goals are: min to mod assist. 10. Case Management and Social Worker will assess and treat for psychological issues and discharge planning. 11. Team conference will be held weekly to assess progress toward goals and to determine barriers to discharge. 12. Patient will receive at least 3 hours of therapy per day at least 5 days per week. 13. ELOS: 20-30 days       14. Prognosis:  excellent     Meredith Staggers, MD, [facility] 10/25/2014   10/25/2014

## 2014-10-25 NOTE — Progress Notes (Signed)
No issues overnight. Pt says she had a HA, its gone now. No other complaints.  EXAM:  BP 154/65 mmHg  Pulse 73  Temp(Src) 98.5 F (36.9 C) (Oral)  Resp 20  Ht 5\' 4"  (1.626 m)  Wt 51.302 kg (113 lb 1.6 oz)  BMI 19.40 kg/m2  SpO2 97%  Awake, alert, oriented to person, not year Speech fluent CN grossly intact  MAE well  IMPRESSION:  69 y.o. female s/p fall, has improved neurologically. Appears to be essentially at baseline.   PLAN: - Placement - Can f/u in my office ([facility] [phone]) in 3-4 weeks. - Cont Keppra 500mg  PO BID

## 2014-10-25 NOTE — Progress Notes (Signed)
Report given to Tobi Bastosnna in 4W.

## 2014-10-25 NOTE — Progress Notes (Signed)
Physical Therapy Treatment Patient Details Name: Sharon [name] MRN: [id number] DOB: [birth year] Today's Date: 10/25/2014    History of Present Illness 69 y.o. female s/p fall with left frontal, L temporal bleed &R cerebellar contusion, R basal skull fxs,  and post traumatic seizures; positive UTI. Hx of Bipolar and dementia.    PT Comments    Patient pleasant and agreeable to 'walking' with therapy despite wanting to watch 'the young and the restless'. Patient continues to demonstrate need for 2 person assist for mobility secondary to ataxic movements and coordination deficits. Patient also continues to demonstrate weakness L.R. At this time, could not progress to use of RW secondary to safety. Will continue to see and progress as tolerated. Continue to recommend CIR upon acute discharge as patient not at baseline for mobility and strength (currently requires 2 person assist).   Follow Up Recommendations  CIR;Supervision/Assistance - 24 hour     Equipment Recommendations  Other (comment) (to be determined as pt progresses)    Recommendations for Other Services       Precautions / Restrictions Precautions Precautions: Fall Precaution Comments: apparent movement disorder PTA Restrictions Weight Bearing Restrictions: No    Mobility  Bed Mobility Overal bed mobility: Needs Assistance Bed Mobility: Supine to Sit;Sit to Supine     Supine to sit: Min assist Sit to supine: Min assist   General bed mobility comments: min assist for initiation and positional movements, assist for LE elevation back to bed  Transfers Overall transfer level: Needs assistance Equipment used:  (wrap around with gait belt) Transfers: Sit to/from Stand Sit to Stand: Mod assist;+2 physical assistance         General transfer comment: Continues to demonstrate left lateral lean during transitional movements secondary to weakness, performed from bed, bench and toiletx3 (hygiene and pericare performed) 2  person assist for safety and stability  Ambulation/Gait Ambulation/Gait assistance: Mod assist;+2 physical assistance Ambulation Distance (Feet): 150 Feet Assistive device: 2 person hand held assist (wrap around support with gait belt) Gait Pattern/deviations: Step-through pattern;Decreased stride length;Ataxic;Narrow base of support Gait velocity: decreased Gait velocity interpretation: Below normal speed for age/gender General Gait Details: significant instability and ataxic movements during gait, improved with bilateral support. LE weakness noted L>R.   Stairs            Wheelchair Mobility    Modified Rankin (Stroke Patients Only)       Balance     Sitting balance-Leahy Scale: Fair Sitting balance - Comments: continues to demonstrate dyskinetic movements while in sitting but imrpved control, able to sit EOB for extended periods without assist, min assist required while sitting on toilet   Standing balance support: Bilateral upper extremity supported Standing balance-Leahy Scale: Poor Standing balance comment: assist for stability (moderate) +2 during dynamic standing and hygiene                     Cognition Arousal/Alertness: Awake/alert Behavior During Therapy: WFL for tasks assessed/performed Overall Cognitive Status: Impaired/Different from baseline Area of Impairment: Orientation;Attention;Problem solving;Rancho level Orientation Level: Disoriented to;Place;Time Current Attention Level: Sustained Memory: Decreased recall of precautions Following Commands: Follows one step commands consistently Safety/Judgement: Decreased awareness of safety Awareness: Emergent Problem Solving: Slow processing      Exercises      General Comments        Pertinent Vitals/Pain Pain Assessment: Faces Faces Pain Scale: Hurts a little bit Pain Location: headache Pain Descriptors / Indicators: Headache Pain Intervention(s): Monitored during session  Home  Living                      Prior Function            PT Goals (current goals can now be found in the care plan section) Acute Rehab PT Goals Patient Stated Goal: to go home PT Goal Formulation: Patient unable to participate in goal setting Time For Goal Achievement: 10/30/14 Potential to Achieve Goals: Good Progress towards PT goals: Progressing toward goals    Frequency  Min 3X/week    PT Plan Discharge plan needs to be updated    Co-evaluation             End of Session Equipment Utilized During Treatment: Gait belt Activity Tolerance: Patient tolerated treatment well Patient left: in bed;with call bell/phone within reach;with family/visitor present     Time: [id number] PT Time Calculation (min) (ACUTE ONLY): 20 min  Charges:  $Gait Training: 8-22 mins                    G CodesFabio Asa:      Eunie Lawn J 10/25/2014, 11:00 AM Charlotte Crumbevon Tashawn Greff, PT DPT  [phone]

## 2014-10-25 NOTE — Progress Notes (Signed)
Rehab admissions - I did receive medical clearance from Dr. Blake DivineAkula and we will admit pt to inpatient rehab later today. I updated pt's husband by phone and will meet with him and pt at 1430 to complete admission paperwork. I updated Toni Amendourtney, case Production designer, theatre/television/filmmanager as well.  Thanks.  Juliann MuleJanine Khiya Friese, PT Rehabilitation Admissions Coordinator [phone]

## 2014-10-25 NOTE — PMR Pre-admission (Signed)
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 Date: A & B: 01-21-80     Deduct: $1288      Out of Pocket Max: none      Life Max: unlimited CIR: 100%      SNF: 100% days 1-20; 80% days 21-100 (100 days visit max) Outpatient: 80%     Co-Pay: 20% Home Health: 100%      Co-Pay: none DME: 80%     Co-Pay: 20% Providers: pt's preference  SECONDARY: BCBS/Federal Emp PPO      Policy#: [id number]      Subscriber: pt's spouse Shandricka Monroy Benefits:  Phone #: [phone]       Emergency Contact Information Contact Information    Name Relation Home Work [place] Spouse [phone]    Danniel, Grenz "Stephens November   [phone]     Current Medical History  Patient Admitting Diagnosis: TBI s/p fall, confusion, gait deficits  History of Present Illness: NICCOLE WITTHUHN is a 69 y.o. female who presented after a fall with left frontal, L temporal bleed &R cerebellar contusion, R basal skull fxs,  and post traumatic seizures. Hospital course also significant for UTI. Pt making progress in therapy, displaying increased activity tolerance. It was felt that she might benefit from an inpatient rehab admission; therefore and inpatient rehab consult was requested.   NIH Total: 7  Past Medical History  Past Medical History  Diagnosis Date  . Bipolar 1 disorder   . Dementia   . Other pancytopenia 06/03/2014  . Stroke     Family History  family history includes Cancer in her mother; Heart attack in her father.  Prior Rehab/Hospitalizations: none   Current Medications   Current facility-administered medications:  .  acetaminophen (TYLENOL) tablet 500 mg, 500 mg, Oral, QHS PRN,  Consuella Lose, MD, 500 mg at 10/18/14 1959 .  antiseptic oral rinse (CPC / CETYLPYRIDINIUM CHLORIDE 0.05%) solution 7 mL, 7 mL, Mouth Rinse, BID, Consuella Lose, MD, 7 mL at 10/25/14 1000 .  cholecalciferol (VITAMIN D) tablet 1,000 Units, 1,000 Units, Oral, QHS, Consuella Lose, MD, 1,000 Units at 10/24/14 2200 .  citalopram (CELEXA) tablet 20 mg, 20 mg, Oral, QHS, Consuella Lose, MD, 20 mg at 10/24/14 2201 .  cyanocobalamin tablet 1,000 mcg, 1,000 mcg, Oral, QHS, Consuella Lose, MD, 1,000 mcg at 10/24/14 2201 .  darifenacin (ENABLEX) 24 hr tablet 7.5 mg, 7.5 mg, Oral, Daily, Consuella Lose, MD, 7.5 mg at 10/25/14 1000 .  feeding supplement (ENSURE ENLIVE) (ENSURE ENLIVE) liquid 237 mL, 237 mL, Oral, BID BM, Baird Lyons, RD, 237 mL at 10/25/14 1000 .  ferrous sulfate 300 (60 FE) MG/5ML syrup 300 mg, 300 mg, Oral, BID WC, Consuella Lose, MD, 300 mg at 10/25/14 0851 .  furosemide (LASIX) tablet 20 mg, 20 mg, Oral, Daily, Hosie Poisson, MD, 20 mg at 10/25/14 1000 .  ipratropium-albuterol (DUONEB) 0.5-2.5 (3) MG/3ML nebulizer solution 3 mL, 3 mL, Nebulization, Q2H PRN, Chesley Mires, MD .  levETIRAcetam (KEPPRA) tablet 500 mg, 500 mg, Oral, BID, Valeda Malm Rumbarger, RPH, 500 mg at 10/25/14  1000 .  lisinopril (PRINIVIL,ZESTRIL) tablet 2.5 mg, 2.5 mg, Oral, Daily, Hosie Poisson, MD, 2.5 mg at 10/25/14 1000 .  LORazepam (ATIVAN) injection 0.5 mg, 0.5 mg, Intravenous, Q4H PRN, Wilhelmina Mcardle, MD, 0.5 mg at 10/21/14 2347 .  pantoprazole sodium (PROTONIX) 40 mg/20 mL oral suspension 40 mg, 40 mg, Per Tube, Q1200, Kara Mead V, MD, 40 mg at 10/25/14 1229 .  QUEtiapine (SEROQUEL) tablet 150 mg, 150 mg, Oral, BID, Hosie Poisson, MD .  RESOURCE THICKENUP CLEAR, , Oral, PRN, Tessa Lerner, MD .  sodium chloride 0.9 % 1,000 mL infusion, , Intravenous, Continuous, Corey Harold, NP, Last Rate: 10 mL/hr at 10/20/14 2136 .  tamsulosin (FLOMAX) capsule 0.4 mg, 0.4 mg, Oral, QHS, Consuella Lose, MD, 0.4 mg at 10/24/14 2202 .  white petrolatum (VASELINE) gel 1 application, 1 application, Topical, PRN, Consuella Lose, MD, 1 application at 16/10/96 1531 .  zinc sulfate capsule 220 mg, 220 mg, Oral, Daily, Consuella Lose, MD, 220 mg at 10/25/14 1000  Patients Current Diet: DIET - DYS 1 Room service appropriate?: Yes; Fluid consistency:: Honey Thick Diet - low sodium heart healthy, only teaspoon amount of honey, meds whole in puree, intermittent supervision (but husband states that the pt eats rather quickly and needs supervision)  Precautions / Restrictions Precautions Precautions: Fall Precaution Comments: apparent movement disorder PTA Restrictions Weight Bearing Restrictions: No   Prior Activity Level Limited Community (1-2x/wk): Pt got out every Friday for her hair appointment and went on limited errands with husband. Husband does all the chores/errands. Husband assists her into the tub and she washes herself . She did need supervision during meal time because she often ate too quickly.   Home Assistive Devices / Equipment Home Assistive Devices/Equipment: None  Prior Functional Level Prior Function Level of Independence: Needs assistance Gait / Transfers Assistance Needed: independent ADL's / Homemaking Assistance Needed: Husband provided S for ADL. Husband states wife spent alot of time "rocking" in a chair./ had nights/days confused at baseline Comments: Husband states pt had "movement problems" PTA. ? dyskinesia. 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 "She's not very agile at her usual" per husband.  Current Functional Level Cognition  Arousal/Alertness: Lethargic Overall Cognitive Status: Impaired/Different from baseline Current Attention Level: Sustained Orientation Level: Oriented to person, Oriented to place, Oriented to time Following Commands: Follows one step commands consistently Safety/Judgement:  Decreased awareness of safety General Comments: improve ability to sequence tasks, patient able to engage in conversation, noting need for use of bathroom, able to demonstrate improvements in awareness during functional tasks Attention: Focused, Sustained Focused Attention: Impaired Focused Attention Impairment: Verbal basic, Functional basic Sustained Attention: Impaired Sustained Attention Impairment: Verbal basic, Functional basic Memory: Impaired Memory Impairment: Retrieval deficit, Decreased recall of new information Awareness: Impaired Awareness Impairment: Intellectual impairment, Emergent impairment, Anticipatory impairment Problem Solving: Impaired Problem Solving Impairment: Verbal basic, Functional basic Executive Function: Initiating Initiating: Impaired Initiating Impairment: Verbal basic, Functional basic Behaviors: Other (comment) (RN reports pt intermittently restless) Safety/Judgment: Impaired [employer] Scales of Cognitive Functioning: Confused/appropriate    Extremity Assessment (includes Sensation/Coordination)  Upper Extremity Assessment: LUE deficits/detail LUE Deficits / Details: spontaneous movement. appesr to demonstrte greater proximal movemetn than distal movement. gross grasp/release LUE Coordination: decreased fine motor, decreased gross motor  Lower Extremity Assessment: Difficult to assess due to impaired cognition, LLE deficits/detail LLE Deficits / Details: Pt able to weight bear through LLE. Appears to demonstrate motor  impersistance. Demonstrates active spontaneous movement.    ADLs  Overall ADL's : Needs assistance/impaired Eating/Feeding: Moderate assistance, Sitting Eating/Feeding Details (indicate cue type and reason): honey thick liquid; pt able to hold cup and bring to mouth. able to monitor sip size Grooming: Moderate assistance Grooming Details (indicate cue type and reason): Pt able to direct care, stating " I need to turn the water  on", i need a towel. Weakness noted L hand during functional tasks. Upper Body Bathing: Moderate assistance Lower Body Bathing: Moderate assistance, Bed level Upper Body Dressing : Moderate assistance Upper Body Dressing Details (indicate cue type and reason): pt assisting with reaching through armhomes. Trying to tie strings Toilet Transfer: +2 for physical assistance, Moderate assistance Toilet Transfer Details (indicate cue type and reason): for safety Toileting- Clothing Manipulation and Hygiene: Moderate assistance, Sitting/lateral lean, Sit to/from stand Toileting - Clothing Manipulation Details (indicate cue type and reason): Pt appropriately reaching for toilet paper and using R hand to complete hygiene after toileting. Approrpiately attemting to use LUE to hold clothing while copleting toileting task. Functional mobility during ADLs: Moderate assistance, +2 for physical assistance (ambulating with +2 using modified 3 musketeer and HHA) General ADL Comments: Max A at this time for ADL. Following commands with increased time. Mod A at times to maintain trunk control due to attention, lethargy and apparent movement disorder.    Mobility  Overal bed mobility: Needs Assistance Bed Mobility: Supine to Sit, Sit to Supine Supine to sit: Min assist Sit to supine: Min assist General bed mobility comments: min assist for initiation and positional movements, assist for LE elevation back to bed    Transfers  Overall transfer level: Needs assistance Equipment used:  (wrap around with gait belt) Transfers: Sit to/from Stand Sit to Stand: Mod assist, +2 physical assistance Stand pivot transfers: Mod assist, +2 physical assistance  Lateral/Scoot Transfers: Total assist, +2 safety/equipment, +2 physical assistance General transfer comment: Continues to demonstrate left lateral lean during transitional movements secondary to weakness, performed from bed, bench and toiletx3 (hygiene and pericare  performed) 2 person assist for safety and stability    Ambulation / Gait / Stairs / Wheelchair Mobility  Ambulation/Gait Ambulation/Gait assistance: Mod assist, +2 physical assistance Ambulation Distance (Feet): 150 Feet Assistive device: 2 person hand held assist (wrap around support with gait belt) Gait Pattern/deviations: Step-through pattern, Decreased stride length, Ataxic, Narrow base of support Gait velocity: decreased Gait velocity interpretation: Below normal speed for age/gender General Gait Details: significant instability and ataxic movements during gait, improved with bilateral support. LE weakness noted L>R.    Posture / Balance Dynamic Sitting Balance Sitting balance - Comments: continues to demonstrate dyskinetic movements while in sitting but imrpved control, able to sit EOB for extended periods without assist, min assist required while sitting on toilet Balance Overall balance assessment: Needs assistance Sitting-balance support: Single extremity supported Sitting balance-Leahy Scale: Fair Sitting balance - Comments: continues to demonstrate dyskinetic movements while in sitting but imrpved control, able to sit EOB for extended periods without assist, min assist required while sitting on toilet Postural control: Posterior lean (difficulty with control of movement) Standing balance support: Bilateral upper extremity supported Standing balance-Leahy Scale: Poor Standing balance comment: assist for stability (moderate) +2 during dynamic standing and hygiene     Special needs/care consideration BiPAP/CPAP no CPM no  Continuous Drip IV no  Dialysis no          Life Vest no  Oxygen no  Special Bed no  Trach Size no  Wound Vac (area) no       Skin - bruise on frontal aspect of head, bruise on her bottom per husband, scattered bruises on UE's per husband                               Bowel mgmt: last BM on 10-24-14 with incontinence Bladder mgmt: urinary incontinence  (husband brought Depends. Pt does not wear these at baseline) Diabetic mgmt no  Pt states pt has sun downing behaviors. Sitter was present on acute care as well.    Previous Home Environment Living Arrangements: Spouse/significant other  Lives With: Spouse Available Help at Discharge: Family Type of Home: [place]: No Additional Comments: Pt unable to provide information related to home situationa nd available assist; We do know that she lives with her husband, who was unavailable at time of eval; Given PMH and current functuional status, SNF for rehab is a safe, reasonable option  Discharge Living Setting Plans for Discharge Living Setting: Patient's home, House Type of Home at Discharge: House Discharge Home Layout: One level Discharge Home Access: Stairs to enter Entrance Stairs-Number of Steps: 3-4 at garage entrance Discharge Bathroom Shower/Tub: Tub/shower unit Does the patient have any problems obtaining your medications?: No  Social/Family/Support Systems Patient Roles: Spouse Contact Information: husband Roxy Manns is primary contact Anticipated Caregiver: husband. Husband also shares he may be looking into getting some aide support. Anticipated Caregiver's Contact Information: see above Ability/Limitations of Caregiver: no limitations Caregiver Availability: 24/7 Discharge Plan Discussed with Primary Caregiver: Yes Is Caregiver In Agreement with Plan?: Yes Does Caregiver/Family have Issues with Lodging/Transportation while Pt is in Rehab?: No  Goals/Additional Needs Patient/Family Goal for Rehab: Supervision with PT/OT; Supervision and Min assist with SLP Expected length of stay: 13-18 days Cultural Considerations: none Dietary Needs: Dys 1, honey thick, teaspoon, meds whole in puree, intermittent supervsion (husband states pt eats fast at baseline and needs reminders to slow down) Equipment Needs: to be determined Special Service Needs: Pt has hx of  dementia and bipolar. Rehab team aware. Pt had safety sitter while on acute care and husband reports pt has been impulsive when she needs to get up and use the restroom. Husband also reports sun downing behaviors at times. Pt/Family Agrees to Admission and willing to participate: Yes (met with pt and her husband on 4-4) Program Orientation Provided & Reviewed with Pt/Caregiver Including Roles  & Responsibilities: Yes   Decrease burden of Care through IP rehab admission: NA   Possible need for SNF placement upon discharge: not anticipated   Patient Condition: This patient's medical and functional status has changed since the consult dated: 10-22-14 in which the Rehabilitation Physician determined and documented that the patient's condition is appropriate for intensive rehabilitative care in an inpatient rehabilitation facility. See "History of Present Illness" (above) for medical update. Functional changes are: moderate assistance of 2 for ambulation of 150'. Patient's medical and functional status update has been discussed with the Rehabilitation physician and patient remains appropriate for inpatient rehabilitation. Will admit to inpatient rehab today.  Preadmission Screen Completed By:  Nanetta Batty, PT, 10/25/2014 2:19 PM ______________________________________________________________________   Discussed status with Dr. Naaman Plummer on 10-25-14 at 1409 and received telephone approval for admission today.  Admission Coordinator:  Nanetta Batty, PT, time 1409/Date 10-25-14

## 2014-10-25 NOTE — Discharge Summary (Signed)
Physician Discharge Summary  Sharon [name] QIH:[id number] DOB: [birth year] DOA: 10/14/2014  PCP: Gwen Pounds, MD  Admit date: 10/14/2014 Discharge date: 10/25/2014  Time spent: 30 minutes  Recommendations for Outpatient Follow-up:  1. FOLLOW up with PCP in 1 to 2 week.s  2. Please make an appointment with cardiology for systolic heart failure 3. We have decreased the dose of the seroquel please follow up with psychiatry   Discharge Diagnoses:  Active Problems:   Intracranial hemorrhage   Intracerebral hemorrhage   Hypoxemia   Pulmonary edema   Frontal lobe contusion bipolar disorder   Discharge Condition: improved  Diet recommendation: low sodium diet.   Filed Weights   10/23/14 0357 10/24/14 0639 10/25/14 0626  Weight: 53.343 kg (117 lb 9.6 oz) 52.39 kg (115 lb 8 oz) 51.302 kg (113 lb 1.6 oz)    History of present illness:  69 year old lady admitted for a fall was found to have skull fracture with brain contusion.   Hospital Course:  1. Acute respiratory failure with hypoxia from pulmonary edema: Resolved with lasix. She is off oxygen with good sats on RA. Her echocardiogram revealed EF of 40 to 45% with diffuse hypokinesis. Started her on low dose lasix and watch her electrolytes. Recommend outpatient cardiology follow up for systolic heart failure.    Chronic systolic heart failure: She appears to be euvolemic . On low dose lasix. Will add lisinopril. Outpatient follow up with cardiology.  Dysphagia: Follow upwith SLP on discharge.    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 She appears comfortable . Neuro surgery on board, no new complaints.    Anemia of chronic disease: Stable.   PT recommended inpatient rehab.  Transfer to inpatient rehab when bed available.    UTI e coli on cultures.  On bactrim to complete the course.    Hypokalemia : repleted as needed. Repeat level is normal.     Bipolar disorder: Pt is on a large dose of seroquel and becomes very drowsy after taking the 300 mg , after speaking with her husband at bedside, we have decreased the dose of seroquel to 150 mg BID.   Procedures:  CT head.   Echocardiogram.   Consultations:  Neurology    Discharge Exam: Filed Vitals:   10/25/14 0958  BP: 117/41  Pulse: 69  Temp: 98 F (36.7 C)  Resp: 20    General: slightly drowsy but comfortable, denies any chest pain or sob Cardiovascular:s1s2 Respiratory: ctab  Discharge Instructions   Discharge Instructions    Diet - low sodium heart healthy    Complete by:  As directed      Discharge instructions    Complete by:  As directed   Follow up with PCP in one week.  Follow up with your psychiatrist in 1 to 2 weeks. We have decreased the dose of seroquel.  Please follow up with cardiology in 2 weeks.          Current Discharge Medication List    START taking these medications   Details  ferrous sulfate 300 (60 FE) MG/5ML syrup Take 5 mLs (300 mg total) by mouth 2 (two) times daily with a meal. Qty: 150 mL, Refills: 3    furosemide (LASIX) 20 MG tablet Take 1 tablet (20 mg total) by mouth daily. Qty: 30 tablet    levETIRAcetam (KEPPRA) 500 MG tablet Take 1 tablet (500 mg total) by mouth 2 (two)  times daily.    lisinopril (PRINIVIL,ZESTRIL) 2.5 MG tablet Take 1 tablet (2.5 mg total) by mouth daily.    pantoprazole sodium (PROTONIX) 40 mg/20 mL PACK Place 20 mLs (40 mg total) into feeding tube daily at 12 noon. Qty: 30 each      CONTINUE these medications which have CHANGED   Details  QUEtiapine (SEROQUEL) 300 MG tablet Take 0.5 tablets (150 mg total) by mouth 2 (two) times daily.   Associated Diagnoses: Anemia, unspecified anemia type      CONTINUE these medications which have NOT CHANGED   Details  acetaminophen (TYLENOL) 500 MG tablet Take 500 mg by mouth at bedtime as needed for moderate pain.    aspirin EC 81 MG tablet  Take 81 mg by mouth at bedtime.    Cholecalciferol (VITAMIN D3) 1000 UNITS CAPS Take 1 capsule by mouth at bedtime as needed.    Associated Diagnoses: Anemia, unspecified anemia type    citalopram (CELEXA) 40 MG tablet Take 20 mg by mouth at bedtime.  Refills: 1   Associated Diagnoses: Anemia, unspecified anemia type    Fish Oil-Cholecalciferol (FISH OIL + D3) 1200-1000 MG-UNIT CAPS Take 1,200 mg by mouth at bedtime.    Associated Diagnoses: Anemia, unspecified anemia type    FLUZONE HIGH-DOSE 0.5 ML SUSY Inject 1 each into the skin once.  Refills: 0   Associated Diagnoses: Anemia, unspecified anemia type    tamsulosin (FLOMAX) 0.4 MG CAPS capsule Take 0.4 mg by mouth at bedtime.  Refills: 3   Associated Diagnoses: Anemia, unspecified anemia type    trospium (SANCTURA) 20 MG tablet Take 20 mg by mouth 2 (two) times daily.  Refills: 11   Associated Diagnoses: Anemia, unspecified anemia type    vitamin B-12 (CYANOCOBALAMIN) 500 MCG tablet Take 1,000 mcg by mouth at bedtime.    zinc sulfate 220 MG capsule Take 220 mg by mouth daily.      STOP taking these medications     ferrous sulfate 325 (65 FE) MG EC tablet        No Known Allergies Follow-up Information    Follow up with Gwen Pounds, MD. Schedule an appointment as soon as possible for a visit in 1 week.   Specialty:  Internal Medicine   Contact information:   [address] [place] [address] [phone]       Follow up with [facility]. Schedule an appointment as soon as possible for a visit in 2 weeks.   Specialty:  Cardiology   Contact information:   [address], [address] [place] [address] [phone]       The results of significant diagnostics from this hospitalization (including imaging, microbiology, ancillary and laboratory) are listed below for reference.    Significant Diagnostic Studies: Ct Head Wo Contrast  10/15/2014   CLINICAL DATA:  Followup  intracranial hemorrhage. Fell down steps striking head on concrete yesterday.  EXAM: CT HEAD WITHOUT CONTRAST  TECHNIQUE: Contiguous axial images were obtained from the base of the skull through the vertex without intravenous contrast.  COMPARISON:  10/14/2014.  FINDINGS: Now visible as hemorrhagic contusion within the right cerebellar hemisphere. Intraparenchymal hematoma measures approximately 2 cm in diameter. No evidence of extra-axial collection in the posterior fossa or compromise of the fourth ventricle. Right-sided skullbase fracture again demonstrated.  Above the tentorium, hemorrhagic contusion of the left temporal tip an both frontal lobes left more than right remain evident, without additional intraparenchymal bleeding. Mass effect with  left-to-right shift of 3 mm remains evident. Subarachnoid blood remains evident bilaterally, more on the left. No hydrocephalus. No visible intraventricular blood.  IMPRESSION: Newly seen hemorrhagic contusion in the right cerebellum. No change in right-sided skullbase and skull fractures.  No appreciable change in hemorrhagic contusion most pronounced in the left frontal lobe but also affecting the right frontal lobe and the left temporal tip. Subarachnoid hemorrhage remains visible, left more than right. 3 mm left-to-right shift, not significantly changed.   Electronically Signed   By: Paulina Fusi M.D.   On: 10/15/2014 12:50   Ct Head Wo Contrast  10/14/2014   CLINICAL DATA:  Fall down steps and hit head on concrete.  EXAM: CT HEAD WITHOUT CONTRAST  CT CERVICAL SPINE WITHOUT CONTRAST  TECHNIQUE: Multidetector CT imaging of the head and cervical spine was performed following the standard protocol without intravenous contrast. Multiplanar CT image reconstructions of the cervical spine were also generated.  COMPARISON:  04/21/2014 brain MRI  FINDINGS: Motion degraded imaging  CT HEAD FINDINGS  Skull and Sinuses:There is a nondepressed fracture through the parasagittal  right occipital bone, which reaches the right hypoglossal canal on cervical spine imaging.  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  Orbits: Motion degradation especially limits assessment of the orbits. No pathology detected.  Brain: There is extensive subarachnoid hemorrhage around the left more than right anterior cerebral convexities, with superimposed subdural hemorrhage in the anterior interhemispheric fissure (1 cm thickness) and likely along the left anterior and middle cranial fossa floors. There is edema in the inferior left frontal lobe with a 2.6 x 3 x 2 cm (8cc volume). Edema also present in the left temporal pole, consistent with nonhemorrhagic contusion. Brain swelling results in 4 mm rightward shift.  Critical Value/emergent results were called by telephone at the time of interpretation on 10/14/2014 at 10:21 am to Dr. Tilden Fossa , who verbally acknowledged these results.  CT CERVICAL SPINE FINDINGS  Motion degraded imaging, especially affecting reformats from the occiput to C3. No acute fracture or traumatic malalignment is suspected. An apparent lucency on axial imaging through the base of the dens is not correlated on reformatted imaging, and is likely from motion. No prevertebral swelling or gross cervical canal hematoma.  IMPRESSION: 1. Best obtainable exam is moderately motion degraded. 2. Left inferior frontal and temporal lobe contusions with 3 cm left frontal hematoma. Swelling results in 4 mm midline shift. 3. Subarachnoid and thin subdural hemorrhage preferentially about the left frontal lobe. 4. Nondepressed right parasagittal occipital bone fracture. 5. No acute cervical spine injury is suspected, but there is motion degradation at the upper cervical spine. If ongoing neck pain or difficult clinical exam, suggest repeat cervical spine  CT when intracranial hemorrhage is followed up.   Electronically Signed   By: Marnee Spring M.D.   On: 10/14/2014 10:40   Ct Cervical Spine Wo Contrast  10/14/2014   CLINICAL DATA:  Fall down steps and hit head on concrete.  EXAM: CT HEAD WITHOUT CONTRAST  CT CERVICAL SPINE WITHOUT CONTRAST  TECHNIQUE: Multidetector CT imaging of the head and cervical spine was performed following the standard protocol without intravenous contrast. Multiplanar CT image reconstructions of the cervical spine were also generated.  COMPARISON:  04/21/2014 brain MRI  FINDINGS: Motion degraded imaging  CT HEAD FINDINGS  Skull and Sinuses:There is a nondepressed fracture through the parasagittal  right occipital bone, which reaches the right hypoglossal canal on cervical spine imaging.  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  Orbits: Motion degradation especially limits assessment of the orbits. No pathology detected.  Brain: There is extensive subarachnoid hemorrhage around the left more than right anterior cerebral convexities, with superimposed subdural hemorrhage in the anterior interhemispheric fissure (1 cm thickness) and likely along the left anterior and middle cranial fossa floors. There is edema in the inferior left frontal lobe with a 2.6 x 3 x 2 cm (8cc volume). Edema also present in the left temporal pole, consistent with nonhemorrhagic contusion. Brain swelling results in 4 mm rightward shift.  Critical Value/emergent results were called by telephone at the time of interpretation on 10/14/2014 at 10:21 am to Dr. Tilden FossaELIZABETH REES , who verbally acknowledged these results.  CT CERVICAL SPINE FINDINGS  Motion degraded imaging, especially affecting reformats from the occiput to C3. No acute fracture or traumatic malalignment is suspected. An apparent lucency on axial imaging  through the base of the dens is not correlated on reformatted imaging, and is likely from motion. No prevertebral swelling or gross cervical canal hematoma.  IMPRESSION: 1. Best obtainable exam is moderately motion degraded. 2. Left inferior frontal and temporal lobe contusions with 3 cm left frontal hematoma. Swelling results in 4 mm midline shift. 3. Subarachnoid and thin subdural hemorrhage preferentially about the left frontal lobe. 4. Nondepressed right parasagittal occipital bone fracture. 5. No acute cervical spine injury is suspected, but there is motion degradation at the upper cervical spine. If ongoing neck pain or difficult clinical exam, suggest repeat cervical spine CT when intracranial hemorrhage is followed up.   Electronically Signed   By: Marnee SpringJonathon  Watts M.D.   On: 10/14/2014 10:40   Dg Chest Port 1 View  10/21/2014   CLINICAL DATA:  Pulmonary edema.  EXAM: PORTABLE CHEST - 1 VIEW  COMPARISON:  10/20/2014.  FINDINGS: Mediastinum and hilar structures are stable. Persistent cardiomegaly. Persistent but improving bilateral pulmonary infiltrates/ edema. Interim clearing of pleural effusions. No pneumothorax.  IMPRESSION: Congestive heart failure with pulmonary edema. Interim improvement from prior exam.   Electronically Signed   By: Maisie Fushomas  Register   On: 10/21/2014 08:10   Dg Chest Port 1 View  10/20/2014   CLINICAL DATA:  A vomiting, pancytopenia, history of CVA, possible pneumonia.  EXAM: PORTABLE CHEST - 1 VIEW  COMPARISON:  None.  FINDINGS: The lungs are well-expanded. The interstitial markings are increased diffusely. There are bilateral pleural effusions and a partially included in the field of view. The cardiac silhouette is enlarged. The pulmonary vascularity is indistinct. The bones are osteopenic.  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 Bibasilar atelectasis and/or small pleural effusions are present.  When the patient can tolerate the  procedure, a PA and lateral chest x-ray would be useful.   Electronically Signed   By: David  SwazilandJordan   On: 10/20/2014 15:52   Dg Chest Port 1 View  10/14/2014   CLINICAL DATA:  Fall following seizure  EXAM: PORTABLE CHEST - 1 VIEW  COMPARISON:  None.  FINDINGS: There is focal infiltrate in the left base. Elsewhere lungs are clear. Heart size and pulmonary vascularity are normal. No adenopathy. No pneumothorax. No bone lesions.  IMPRESSION: Left base infiltrate.  Lungs otherwise clear.  No pneumothorax.   Electronically Signed   By:  Bretta Bang III M.D.   On: 10/14/2014 09:40   Dg Abd Portable 1v  10/16/2014   CLINICAL DATA:  NG tube placement  EXAM: PORTABLE ABDOMEN - 1 VIEW  COMPARISON:  None.  FINDINGS: NG tube tip is in the mid portion of the stomach. Nonobstructive bowel gas pattern.  IMPRESSION: NG tube tip in the mid stomach.   Electronically Signed   By: Charlett Nose M.D.   On: 10/16/2014 22:18   Dg Swallowing Func-speech Pathology  10/21/2014    Objective Swallowing Evaluation:   Completed by Leonia Corona, SLP  Student, Supervised and reviewed by Harlon Ditty MA CCC-SLP  Patient Details  Name: Sharon [name] MRN: [id number] Date of Birth: [birth year]  Today's Date: 10/21/2014 Time: SLP Start Time (ACUTE ONLY): 1137-SLP Stop Time (ACUTE ONLY): 1155 SLP Time Calculation (min) (ACUTE ONLY): 18 min  Past Medical History:  Past Medical History  Diagnosis Date  . Bipolar 1 disorder   . Dementia   . Other pancytopenia 06/03/2014  . Stroke    Past Surgical History:  Past Surgical History  Procedure Laterality Date  . Cholecystectomy     HPI:  HPI: Pt is a 69 y/o female with PMH of advanced dementia and bipolar  disorder who came in to [facility] following a fall at home and seizure activity.  Ct revealed hemorrhagic contusion in the R cerebellum. Pt has no prior hx  of dysphagia.  No Data Recorded  Assessment / Plan / Recommendation CHL IP CLINICAL IMPRESSIONS 10/21/2014  Dysphagia Diagnosis Mild oral  phase dysphagia;Moderate oral phase  dysphagia  Clinical impression Pt presents with moderate oral and moderate pharyngeal  phase dysphagia characterized by slow oral transit, base of tongue  weakness and delayed swallow initiation to the pyriforms.Pt aspirated with  thin and nectar thick liquids during the swallow. Sensation intermittent.  Mild-moderate amount of apirate ; not expelled despite cue to cough. No  aspiration observed with teaspoon amounts of honey-thick liquids. Pt still  at risk for aspiration given delayed swallow initiation, reduced airway  protection and reduced sensation. Recommend pt initiate dys 1/ honey thick  liquid diet; only teaspoon amounts of honey; meds whole in puree. Speech  will follow-up with diet tolerance.       CHL IP TREATMENT RECOMMENDATION 10/21/2014  Treatment Plan Recommendations Therapy as outlined in treatment plan below      CHL IP DIET RECOMMENDATION 10/21/2014  Diet Recommendations Dysphagia 1 (Puree);Honey-thick liquid  Liquid Administration via Spoon  Medication Administration Whole meds with puree  Compensations Slow rate;Small sips/bites  Postural Changes and/or Swallow Maneuvers Seated upright 90 degrees     CHL IP OTHER RECOMMENDATIONS 10/21/2014  Recommended Consults (None)  Oral Care Recommendations Oral care Q4 per protocol  Other Recommendations Order thickener from pharmacy     CHL IP FOLLOW UP RECOMMENDATIONS 10/21/2014  Follow up Recommendations Inpatient Rehab     CHL IP FREQUENCY AND DURATION 10/21/2014  Speech Therapy Frequency (ACUTE ONLY) min 3x week  Treatment Duration 2 weeks     Pertinent Vitals/Pain NA    SLP Swallow Goals No flowsheet data found.  No flowsheet data found.    CHL IP REASON FOR REFERRAL 10/21/2014  Reason for Referral Objectively evaluate swallowing function     CHL IP ORAL PHASE 10/21/2014  Lips (None)  Tongue (None)  Mucous membranes (None)  Nutritional status (None)  Other (None)  Oxygen therapy (None)  Oral Phase Impaired  Oral -  Pudding Teaspoon (None)  Oral - Pudding Cup (None)  Oral - Honey Teaspoon Delayed oral transit  Oral - Honey Cup Delayed oral transit  Oral - Honey Syringe (None)  Oral - Nectar Teaspoon (None)  Oral - Nectar Cup (None)  Oral - Nectar Straw Delayed oral transit  Oral - Nectar Syringe (None)  Oral - Ice Chips (None)  Oral - Thin Teaspoon Other (Comment)  Oral - Thin Cup Other (Comment)  Oral - Thin Straw Other (Comment)  Oral - Thin Syringe (None)  Oral - Puree Delayed oral transit  Oral - Mechanical Soft (None)  Oral - Regular Delayed oral transit  Oral - Multi-consistency (None)  Oral - Pill (None)  Oral Phase - Comment (None)      CHL IP PHARYNGEAL PHASE 10/21/2014  Pharyngeal Phase Impaired  Pharyngeal - Pudding Teaspoon (None)  Penetration/Aspiration details (pudding teaspoon) (None)  Pharyngeal - Pudding Cup (None)  Penetration/Aspiration details (pudding cup) (None)  Pharyngeal - Honey Teaspoon Delayed swallow initiation;Premature spillage  to pyriform sinuses  Penetration/Aspiration details (honey teaspoon) (None)  Pharyngeal - Honey Cup Delayed swallow initiation;Premature spillage to  pyriform sinuses  Penetration/Aspiration details (honey cup) (None)  Pharyngeal - Honey Syringe (None)  Penetration/Aspiration details (honey syringe) (None)  Pharyngeal - Nectar Teaspoon (None)  Penetration/Aspiration details (nectar teaspoon) (None)  Pharyngeal - Nectar Cup Penetration/Aspiration before swallow  Penetration/Aspiration details (nectar cup) Material enters airway, passes  BELOW cords and not ejected out despite cough attempt by patient  Pharyngeal - Nectar Straw Delayed swallow initiation;Premature spillage to  pyriform sinuses;Penetration/Aspiration before swallow  Penetration/Aspiration details (nectar straw) Material enters airway,  passes BELOW cords and not ejected out despite cough attempt by patient  Pharyngeal - Nectar Syringe (None)  Penetration/Aspiration details (nectar syringe) (None)  Pharyngeal -  Ice Chips (None)  Penetration/Aspiration details (ice chips) (None)  Pharyngeal - Thin Teaspoon Delayed swallow initiation;Premature spillage  to pyriform sinuses;Penetration/Aspiration before swallow  Penetration/Aspiration details (thin teaspoon) Material enters airway,  passes BELOW cords and not ejected out despite cough attempt by patient  Pharyngeal - Thin Cup Delayed swallow initiation;Premature spillage to  pyriform sinuses;Penetration/Aspiration before swallow  Penetration/Aspiration details (thin cup) Material enters airway, passes  BELOW cords and not ejected out despite cough attempt by patient  Pharyngeal - Thin Straw Delayed swallow initiation;Premature spillage to  pyriform sinuses;Penetration/Aspiration before swallow  Penetration/Aspiration details (thin straw) Material enters airway, passes  BELOW cords and not ejected out despite cough attempt by patient  Pharyngeal - Thin Syringe (None)  Penetration/Aspiration details (thin syringe') (None)  Pharyngeal - Puree Delayed swallow initiation;Premature spillage to  valleculae;Pharyngeal residue - valleculae  Penetration/Aspiration details (puree) (None)  Pharyngeal - Mechanical Soft (None)  Penetration/Aspiration details (mechanical soft) (None)  Pharyngeal - Regular Delayed swallow initiation;Premature spillage to  valleculae;Pharyngeal residue - valleculae  Penetration/Aspiration details (regular) (None)  Pharyngeal - Multi-consistency (None)  Penetration/Aspiration details (multi-consistency) (None)  Pharyngeal - Pill Delayed swallow initiation;Premature spillage to  valleculae  Penetration/Aspiration details (pill) (None)  Pharyngeal Comment (None)     CHL IP CERVICAL ESOPHAGEAL PHASE 10/21/2014  Cervical Esophageal Phase WFL  Pudding Teaspoon (None)  Pudding Cup (None)  Honey Teaspoon (None)  Honey Cup (None)  Honey Syringe (None)  Nectar Teaspoon (None)  Nectar Cup (None)  Nectar Straw (None)  Nectar Syringe (None)  Thin Teaspoon (None)  Thin Cup  (None)  Thin Straw (None)  Thin Syringe (None)  Cervical Esophageal Comment (None)    No flowsheet data found.  DeBlois, Riley Nearing 10/21/2014, 2:00 PM       Microbiology: No results found for this or any previous visit (from the past 240 hour(s)).   Labs: Basic Metabolic Panel:  Recent Labs Lab 10/18/14 1459 10/19/14 0140 10/20/14 0550 10/22/14 0612 10/23/14 1034 10/25/14 0651  NA  --  146* 146* 142 138 133*  K  --  3.4* 3.9 3.2* 4.2 4.1  CL  --  118* 117* 104 106 101  CO2  --  18* 22 32 21 23  GLUCOSE  --  163* 138* 151* 184* 158*  BUN  --  16 14 20 14 16   CREATININE  --  0.66 0.57 0.66 0.70 0.60  CALCIUM  --  8.2* 8.2* 8.0* 8.7 8.7  MG 1.9 1.8 1.8 1.6  --   --   PHOS 3.3 2.7 2.7  --   --   --    Liver Function Tests: No results for input(s): AST, ALT, ALKPHOS, BILITOT, PROT, ALBUMIN in the last 168 hours. No results for input(s): LIPASE, AMYLASE in the last 168 hours. No results for input(s): AMMONIA in the last 168 hours. CBC:  Recent Labs Lab 10/19/14 0140 10/20/14 0550 10/22/14 0612 10/25/14 0651  WBC 3.4* 6.7 2.8* 5.2  HGB 7.9* 8.2* 8.8* 9.3*  HCT 23.1* 24.6* 26.7* 28.7*  MCV 85.9 86.6 87.0 85.2  PLT 109* 175 144* 167   Cardiac Enzymes:  Recent Labs Lab 10/24/14 2328 10/25/14 0651  TROPONINI 0.05* 0.04*   BNP: BNP (last 3 results) No results for input(s): BNP in the last 8760 hours.  ProBNP (last 3 results) No results for input(s): PROBNP in the last 8760 hours.  CBG:  Recent Labs Lab 10/20/14 1131 10/20/14 1651 10/20/14 2000 10/20/14 2318 10/21/14 0329  GLUCAP 174* 154* 173* 157* 171*       Signed:  Zainab Crumrine  [facility] 10/25/2014, 12:52 PM

## 2014-10-25 NOTE — Clinical Social Work Note (Signed)
CSW informed by MD [facility]   Clinical Social Worker will sign off for now as social work intervention is no longer needed. Please consult us again if new need arises.  Derenda FennelBashira Dade Rodin, MSW, LCSWA [phone] 10/25/2014 1:09 PM

## 2014-10-25 NOTE — Progress Notes (Signed)
Patient discharged to inpatient rehab with all belongings. VSS. Family notified of the transfer.

## 2014-10-26 ENCOUNTER — Inpatient Hospital Stay (HOSPITAL_COMMUNITY): Payer: Medicare Other | Admitting: Occupational Therapy

## 2014-10-26 ENCOUNTER — Inpatient Hospital Stay (HOSPITAL_COMMUNITY): Payer: Medicare Other | Admitting: Speech Pathology

## 2014-10-26 ENCOUNTER — Inpatient Hospital Stay (HOSPITAL_COMMUNITY): Payer: Medicare Other

## 2014-10-26 LAB — CBC WITH DIFFERENTIAL/PLATELET
BASOS ABS: 0 10*3/uL (ref 0.0–0.1)
Basophils Relative: 0 % (ref 0–1)
EOS ABS: 0 10*3/uL (ref 0.0–0.7)
EOS PCT: 0 % (ref 0–5)
HEMATOCRIT: 30.6 % — AB (ref 36.0–46.0)
HEMOGLOBIN: 10.5 g/dL — AB (ref 12.0–15.0)
LYMPHS ABS: 0.7 10*3/uL (ref 0.7–4.0)
Lymphocytes Relative: 11 % — ABNORMAL LOW (ref 12–46)
MCH: 29.3 pg (ref 26.0–34.0)
MCHC: 34.3 g/dL (ref 30.0–36.0)
MCV: 85.5 fL (ref 78.0–100.0)
Monocytes Absolute: 0.6 10*3/uL (ref 0.1–1.0)
Monocytes Relative: 9 % (ref 3–12)
Neutro Abs: 4.8 10*3/uL (ref 1.7–7.7)
Neutrophils Relative %: 80 % — ABNORMAL HIGH (ref 43–77)
PLATELETS: 191 10*3/uL (ref 150–400)
RBC: 3.58 MIL/uL — AB (ref 3.87–5.11)
RDW: 14 % (ref 11.5–15.5)
WBC: 6 10*3/uL (ref 4.0–10.5)

## 2014-10-26 LAB — COMPREHENSIVE METABOLIC PANEL
ALBUMIN: 3.3 g/dL — AB (ref 3.5–5.2)
ALT: 114 U/L — ABNORMAL HIGH (ref 0–35)
AST: 55 U/L — AB (ref 0–37)
Alkaline Phosphatase: UNDETERMINED U/L (ref 39–117)
Anion gap: 7 (ref 5–15)
BILIRUBIN TOTAL: 1.3 mg/dL — AB (ref 0.3–1.2)
BUN: 21 mg/dL (ref 6–23)
CHLORIDE: 101 mmol/L (ref 96–112)
CO2: 26 mmol/L (ref 19–32)
CREATININE: 0.69 mg/dL (ref 0.50–1.10)
Calcium: 8.8 mg/dL (ref 8.4–10.5)
GFR calc Af Amer: >90 mL/min (ref >90)
GFR, EST NON AFRICAN AMERICAN: 86 mL/min — AB (ref >90)
Glucose, Bld: 147 mg/dL — ABNORMAL HIGH (ref 70–99)
Potassium: 4.6 mmol/L (ref 3.5–5.1)
SODIUM: 134 mmol/L — AB (ref 135–145)
Total Protein: 6.4 g/dL (ref 6.0–8.3)

## 2014-10-26 NOTE — Plan of Care (Signed)
Plan of Care Note    Pt's plan of care adjusted to 3 hours after speaking with care team and discussed with MD in team conference as pt currently unable to tolerate current therapy schedule with OT, PT, and SLP.   Feliberto Gottronourtney Clif Serio, [place], CCC-SLP [phone]

## 2014-10-26 NOTE — Evaluation (Signed)
Physical Therapy Assessment and Plan  Patient Details  Name: Sharon [name] MRN: [id number] Date of Birth: [birth year]  PT Diagnosis: Abnormal posture, Abnormality of gait, Ataxia, Cognitive deficits, Hypotonia and Muscle weakness Rehab Potential: Fair ELOS: 10-14 days   Today's Date: 10/26/2014 PT Individual Time: 1000-1100 PT Individual Time Calculation (min): 60 min    Problem List:  Patient Active Problem List   Diagnosis Date Noted  . 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 Frontal lobe contusion   . Hypoxemia 10/21/2014  . Pulmonary edema   . Intracerebral hemorrhage 10/20/2014  . Intracranial hemorrhage 10/14/2014  . Other pancytopenia 06/03/2014    Past Medical History:  Past Medical History  Diagnosis Date  . Bipolar 1 disorder   . Dementia   . Other pancytopenia 06/03/2014  . Stroke    Past Surgical History:  Past Surgical History  Procedure Laterality Date  . Cholecystectomy      Assessment & Plan Clinical Impression: Sharon [name] is a 69 y.o right handed. female with history of chronic systolic congestive heart failure, dementia and bipolar disorder followed by [name] of psychiatry services. She lives with her husband and sedentary prior to admission. She was able to dress herself as well as feed herself but no meal preparation. Presented 10/14/2014 after a fall with left frontal, L temporal bleed &R cerebellar contusion, R basal skull fxs, and post traumatic seizures. Neurosurgery Dr. Joya Salm consulted with conservative care. Echocardiogram with ejection fraction of 45% no wall motion abnormalities. Hospital course also significant for UTI with antibiotics completed. Swallow study completed maintain on a dysphagia 1 honey thick liquid diet. She continues on Keppra 500 mg twice daily. Pt making progress in therapy, displaying increased activity tolerance but still very impulsive with a sitter at bedtime. Patient transferred to CIR  on 10/25/2014.   Patient currently requires total +2 assist with mobility secondary to muscle weakness, decreased cardiorespiratoy endurance, impaired timing and sequencing, abnormal tone, unbalanced muscle activation, ataxia, decreased coordination and decreased motor planning and decreased initiation, decreased attention, decreased awareness, decreased problem solving, decreased safety awareness, decreased memory and delayed processing.  Prior to hospitalization, patient was independent  with mobility (assist needed for ADLs) and lived with Spouse in a House home.  Home access is unknown   .  Patient will benefit from skilled PT intervention to maximize safe functional mobility, minimize fall risk and decrease caregiver burden for planned discharge home with 24 hour assist.  Anticipate patient will benefit from follow up [facility] at discharge. Will need to verify available assist at discharge with family.   PT - End of Session Activity Tolerance: Decreased this session;Tolerates 10 - 20 min activity with multiple rests Endurance Deficit: Yes Endurance Deficit Description: initially responsive with progressively decreased arousal, attempting to lie down in wheelchair/any surface PT Assessment Rehab Potential (ACUTE/IP ONLY): Fair Barriers to Discharge: Inaccessible home environment;Decreased caregiver support Barriers to Discharge Comments: Lacking information regarding home setup, discharge plan, and assistance available PT Patient demonstrates impairments in the following area(s): Balance;Behavior;Endurance;Motor;Nutrition;Perception;Safety;Sensory PT Transfers Functional Problem(s): Bed Mobility;Bed to Chair;Car;Furniture PT Locomotion Functional Problem(s): Ambulation;Stairs PT Plan PT Intensity: Minimum of 1-2 x/day ,45 to 90 minutes PT Frequency: 5 out of 7 days PT Duration Estimated Length of Stay: 10-14 days PT Treatment/Interventions: Ambulation/gait training;Balance/vestibular  training;Cognitive remediation/compensation;Discharge planning;Disease management/prevention;DME/adaptive equipment instruction;Functional mobility training;Neuromuscular re-education;Patient/family education;Psychosocial support;Stair training;Therapeutic Activities;Therapeutic Exercise;UE/LE Strength taining/ROM;UE/LE Coordination activities;Wheelchair propulsion/positioning PT Transfers Anticipated Outcome(s): min A PT Locomotion Anticipated Outcome(s): min A  household ambulator PT Recommendation Recommendations for Other Services: Neuropsych consult Follow Up Recommendations: 24 hour supervision/assistance;Skilled nursing facility;Home health PT ([place] vs SNF, unsure of available assist at discharge) Patient destination: Home Equipment Recommended: To be determined  Skilled Therapeutic Intervention Skilled therapeutic intervention initiated after completion of evaluation. Patient with decreased arousal and responded with one word answers to all questions, able to state name and day/month of birth. Patient required +2 assist for safety due to fluctuating sitting balance from supervision to total A and for stand pivot transfers and gait with total A x 2 to prevent fall due to patient's decreased arousal and impaired attention and awareness. Patient had episode after being transferred to toilet of non-responsiveness with eyes open but unable to respond to name or commands. RN and PA Dan notified and present in room. Patient was returned to bed total A x 3 and BP lower than earlier but [facility]. After return to bed and sternal rub, patient able to respond but continued lethargy noted throughout remainder of session. Medical team aware of episode. Patient returned to bed at end of session with bed alarm on and sitter present in room.    PT Evaluation Precautions/Restrictions Precautions Precautions: Fall Precaution Comments: apparent movement disorder PTA (?dyskinesia), sits without  warning Restrictions Weight Bearing Restrictions: No General Chart Reviewed: Yes Family/Caregiver Present: No Vital SignsTherapy Vitals Pulse Rate: 68 BP: 105/63 mmHg Patient Position (if appropriate): Lying Oxygen Therapy SpO2: 100 % O2 Device: Not Delivered Pain Pain Assessment Pain Assessment: Faces Faces Pain Scale: No hurt Home Living/Prior Functioning Home Living Available Help at Discharge: Family Type of Home: House Additional Comments: Pt unable to provide information in regards to home setup, etc. No family present.  Lives With: Spouse Prior Function Level of Independence: Needs assistance with ADLs;Needs assistance with tranfers;Other (comment) (supervision self-care and per chart husband "assisted" with tub transfer) Comments: Husband states pt had "movement problems" PTA. ? dyskinesia Vision/Perception  Vision - Assessment Additional Comments: unable to formally assess due to impaired cognition; pt tracked all quadrants with increased head turns and verbal cues  Cognition Overall Cognitive Status: Impaired/Different from baseline (hx of cognitive impairments at baseline as well) Arousal/Alertness: Lethargic Orientation Level: Oriented to person;Disoriented to time;Disoriented to situation;Oriented to place Sustained Attention: Impaired Sustained Attention Impairment: Verbal basic;Functional basic Memory: Impaired Memory Impairment: Decreased recall of new information;Decreased short term memory Awareness: Impaired Awareness Impairment: Intellectual impairment;Emergent impairment;Anticipatory impairment Problem Solving: Impaired Problem Solving Impairment: Verbal basic;Functional basic Executive Function:  (all impaired) Safety/Judgment: Impaired [employer] Scales of Cognitive Functioning: Confused/inappropriate/non-agitated Sensation Sensation Light Touch: Impaired by gross assessment Hot/Cold: Appears Intact Additional Comments: difficult to assess  due to decreased arousal and cognition Coordination Gross Motor Movements are Fluid and Coordinated: No Fine Motor Movements are Fluid and Coordinated: No Coordination and Movement Description: questionable dyskinesia PTA, ataxic Motor  Motor Motor: Abnormal postural alignment and control;Ataxia  Mobility Bed Mobility Bed Mobility: Supine to Sit;Sit to Supine Supine to Sit: 4: Min assist;HOB elevated Supine to Sit Details: Tactile cues for initiation;Verbal cues for sequencing;Manual facilitation for weight shifting;Visual cues/gestures for sequencing Sit to Supine: 5: Supervision Transfers Transfers: Yes Sit to Stand: From chair/3-in-1;From bed;1: +2 Total assist Sit to Stand Details: Tactile cues for initiation;Verbal cues for sequencing;Visual cues/gestures for sequencing;Manual facilitation for weight shifting Stand to Sit: To chair/3-in-1;To toilet;1: +2 Total assist Stand to Sit Details (indicate cue type and reason): Tactile cues for initiation;Verbal cues for sequencing;Visual cues/gestures for sequencing;Manual facilitation for weight shifting Stand  Pivot Transfers: 1: +2 Total assist Stand Pivot Transfer Details: Tactile cues for initiation;Visual cues/gestures for sequencing;Visual cues/gestures for precautions/safety;Verbal cues for sequencing;Manual facilitation for weight shifting Locomotion  Ambulation Ambulation: Yes Ambulation/Gait Assistance: 1: +2 Total assist Ambulation Distance (Feet): 25 Feet Assistive device: 2 person hand held assist (3 musketeers) Ambulation/Gait Assistance Details: Verbal cues for precautions/safety;Tactile cues for initiation;Manual facilitation for weight shifting Ambulation/Gait Assistance Details: patient stopped ambulating and sat without chair present on therapists' legs multiple times due to ?attention/awareness/arousal Gait Gait: Yes Gait Pattern: Impaired Gait Pattern: Narrow base of support;Trunk flexed;Ataxic;Right flexed knee in  stance;Left flexed knee in stance;Decreased stride length Gait velocity: decreased Stairs / Additional Locomotion Stairs: No (unsafe to attempt on eval) Ramp: Not tested (comment) Curb: Not tested (comment) Wheelchair Mobility Wheelchair Mobility: Yes Wheelchair Assistance: 1: +1 Total assist;2: Max Technical sales engineer Details: Tactile cues for initiation;Tactile cues for sequencing;Manual facilitation for weight bearing;Verbal cues for technique;Visual cues/gestures for sequencing Wheelchair Propulsion: Both lower extermities Wheelchair Parts Management: Needs assistance  Trunk/Postural Assessment  Cervical Assessment Cervical Assessment: Exceptions to [facility] (forward head) Thoracic Assessment Thoracic Assessment: Exceptions to [facility] (kyphotic) Lumbar Assessment Lumbar Assessment: Exceptions to [facility] Postural Control Postural Control: Deficits on evaluation Protective Responses: absent  Balance Balance Balance Assessed: Yes Static Sitting Balance Static Sitting - Balance Support: Feet supported;Bilateral upper extremity supported Static Sitting - Level of Assistance: 1: +1 Total assist;5: Stand by assistance Dynamic Sitting Balance Dynamic Sitting - Balance Support: Feet supported;During functional activity Dynamic Sitting - Level of Assistance: 1: +1 Total assist Sitting balance - Comments: Patient with inconsistent sitting balance with multiple occasions from supervision to total A due to anterior/posteriorly leans  Static Standing Balance Static Standing - Balance Support: During functional activity Static Standing - Level of Assistance: 1: +2 Total assist Dynamic Standing Balance Dynamic Standing - Balance Support: During functional activity Dynamic Standing - Level of Assistance: 1: +2 Total assist Extremity Assessment  RUE Assessment RUE Assessment: Within Functional Limits (4/5 strength) LUE Assessment LUE Assessment: Exceptions to [facility] LUE Strength LUE Overall  Strength Comments: Inconsistent with use of LUE d/t cognitive deficits; pt with full AROM when brushing hair, however limited initiation of movement to LUE upon command RLE Assessment RLE Assessment: Exceptions to [facility] RLE Strength RLE Overall Strength: Deficits;Due to impaired cognition RLE Overall Strength Comments: appears grossly 3-/5 but inconsistent throughout session, limited by cognition/awareness/attention/arousal  LLE Assessment LLE Assessment: Exceptions to [facility] LLE Strength LLE Overall Strength: Deficits;Due to impaired cognition LLE Overall Strength Comments: appears grossly 3-/5 but inconsistent throughout session, limited by cognition/awareness/attention/arousal   FIM:  FIM - Bed/Chair Transfer Bed/Chair Transfer Assistive Devices: Arm rests;HOB elevated Bed/Chair Transfer: 5: Sit > Supine: Supervision (verbal cues/safety issues);4: Supine > Sit: Min A (steadying Pt. > 75%/lift 1 leg);1: Two helpers FIM - Locomotion: Wheelchair Locomotion: Wheelchair: 1: Total Assistance/staff pushes wheelchair (Pt<25%) FIM - Locomotion: Ambulation Locomotion: Ambulation Assistive Devices: Other (comment) (3 musketeers) Ambulation/Gait Assistance: 1: +2 Total assist Locomotion: Ambulation: 1: Two helpers FIM - Locomotion: Stairs Locomotion: Stairs: 0: Activity did not occur (unsafe to attempt)   Refer to Care Plan for Long Term Goals  Recommendations for other services: Neuropsych  Discharge Criteria: Patient will be discharged from PT if patient refuses treatment 3 consecutive times without medical reason, if treatment goals not met, if there is a change in medical status, if patient makes no progress towards goals or if patient is discharged from hospital.  The above assessment, treatment plan, treatment alternatives and goals  were discussed and mutually agreed upon: by patient  Laretta Alstrom 10/26/2014, 12:58 PM

## 2014-10-26 NOTE — Interval H&P Note (Signed)
Sharon [name] was admitted yesterday  to Inpatient Rehabilitation with the diagnosis of traumatic brain injury. This encounter and document were completed on 10/25/14. The H&P is now being placed in the rehab encounter for the purpose of charting.  The patient's history has been reviewed, patient examined, and there is no change in status.  Patient continues to be appropriate for intensive inpatient rehabilitation.  I have reviewed the patient's chart and labs.  Questions were answered to the patient's satisfaction.    [name] T 10/26/2014, 8:26 AM

## 2014-10-26 NOTE — H&P (View-Only) (Signed)
Physical Medicine and Rehabilitation Admission H&P    Chief Complaint  Patient presents with  . Fall  . Seizures  : HPI: Sharon [name] is a 69 y.o right handed. female with history of chronic systolic congestive heart failure, dementia and bipolar disorder followed by [name] of psychiatry services. She lives with her husband and sedentary prior to admission. She was able to dress herself as well as feed herself but no meal preparation. Presented 10/14/2014 after a fall with left frontal, L temporal bleed &R cerebellar contusion, R basal skull fxs, and post traumatic seizures. Neurosurgery Dr. Joya Salm consulted with conservative care. Echocardiogram with ejection fraction of 45% no wall motion abnormalities. Hospital course also significant for UTI with antibiotics completed. Swallow study completed maintain on a dysphagia 1 honey thick liquid diet. She continues on Keppra 500 mg twice daily. Pt making progress in therapy, displaying increased activity tolerance but still very impulsive with a sitter at bedtime. It was felt that she might benefit from an inpatient rehab admission; therefore and inpatient rehab consult was requested. Patient was admitted for comprehensive rehabilitation program  Review of Systems  Gastrointestinal: Positive for constipation.  Psychiatric/Behavioral:       Bipolar disorder, dementia  All other systems reviewed and are negative.  Past Medical History  Diagnosis Date  . Bipolar 1 disorder   . Dementia   . Other pancytopenia 06/03/2014  . Stroke    Past Surgical History  Procedure Laterality Date  . Cholecystectomy     Family History  Problem Relation Age of Onset  . Cancer Mother   . Heart attack Father    Social History:  reports that she has never smoked. She has never used smokeless tobacco. She reports that she does not drink alcohol or use illicit drugs. Allergies: No Known Allergies Medications Prior to Admission  Medication Sig  Dispense Refill  . acetaminophen (TYLENOL) 500 MG tablet Take 500 mg by mouth at bedtime as needed for moderate pain.    Marland Kitchen aspirin EC 81 MG tablet Take 81 mg by mouth at bedtime.    . Cholecalciferol (VITAMIN D3) 1000 UNITS CAPS Take 1 capsule by mouth at bedtime as needed.     . citalopram (CELEXA) 40 MG tablet Take 20 mg by mouth at bedtime.   1  . Fish Oil-Cholecalciferol (FISH OIL + D3) 1200-1000 MG-UNIT CAPS Take 1,200 mg by mouth at bedtime.     Marland Kitchen FLUZONE HIGH-DOSE 0.5 ML SUSY Inject 1 each into the skin once.   0  . QUEtiapine (SEROQUEL) 300 MG tablet Take 300 mg by mouth 2 (two) times daily.     . tamsulosin (FLOMAX) 0.4 MG CAPS capsule Take 0.4 mg by mouth at bedtime.   3  . trospium (SANCTURA) 20 MG tablet Take 20 mg by mouth 2 (two) times daily.   11  . vitamin B-12 (CYANOCOBALAMIN) 500 MCG tablet Take 1,000 mcg by mouth at bedtime.    Marland Kitchen zinc sulfate 220 MG capsule Take 220 mg by mouth daily.    . ferrous sulfate 325 (65 FE) MG EC tablet Take 1 tablet (325 mg total) by mouth 2 (two) times daily after a meal. 60 tablet 3    Home: Home Living Family/patient expects to be discharged to:: Skilled nursing facility Living Arrangements: Spouse/significant other Available Help at Discharge: Family Type of Home: House Additional Comments: Pt unable to provide information related to home situationa nd available assist; We do know that she lives  with her husband, who was unavailable at time of eval; Given PMH and current functuional status, SNF for rehab is a safe, reasonable option  Lives With: Spouse   Functional History: Prior Function Level of Independence: Needs assistance Gait / Transfers Assistance Needed: independent ADL's / Homemaking Assistance Needed: Husband provided S for ADL. Husband states wife spent alot of time "rocking" in a chair./ had nights/days confused at baseline Comments: Husband states pt had "movement problems" PTA. ? dyskinesia  Functional Status:    Mobility: Bed Mobility Overal bed mobility: Needs Assistance Bed Mobility: Supine to Sit, Sit to Supine Supine to sit: Min assist Sit to supine: Min assist General bed mobility comments: min assist for initiation and positional movements, assist for LE elevation back to bed Transfers Overall transfer level: Needs assistance Equipment used:  (wrap around with gait belt) Transfers: Sit to/from Stand Sit to Stand: Mod assist, +2 physical assistance Stand pivot transfers: Mod assist, +2 physical assistance  Lateral/Scoot Transfers: Total assist, +2 safety/equipment, +2 physical assistance General transfer comment: Continues to demonstrate left lateral lean during transitional movements secondary to weakness, performed from bed, bench and toiletx3 (hygiene and pericare performed) 2 person assist for safety and stability Ambulation/Gait Ambulation/Gait assistance: Mod assist, +2 physical assistance Ambulation Distance (Feet): 150 Feet Assistive device: 2 person hand held assist (wrap around support with gait belt) Gait Pattern/deviations: Step-through pattern, Decreased stride length, Ataxic, Narrow base of support Gait velocity: decreased Gait velocity interpretation: Below normal speed for age/gender General Gait Details: significant instability and ataxic movements during gait, improved with bilateral support. LE weakness noted L>R.    ADL: ADL Overall ADL's : Needs assistance/impaired Eating/Feeding: Moderate assistance, Sitting Eating/Feeding Details (indicate cue type and reason): honey thick liquid; pt able to hold cup and bring to mouth. able to monitor sip size Grooming: Moderate assistance Grooming Details (indicate cue type and reason): Pt able to direct care, stating " I need to turn the water on", i need a towel. Weakness noted L hand during functional tasks. Upper Body Bathing: Moderate assistance Lower Body Bathing: Moderate assistance, Bed level Upper Body Dressing :  Moderate assistance Upper Body Dressing Details (indicate cue type and reason): pt assisting with reaching through armhomes. Trying to tie strings Toilet Transfer: +2 for physical assistance, Moderate assistance Toilet Transfer Details (indicate cue type and reason): for safety Toileting- Clothing Manipulation and Hygiene: Moderate assistance, Sitting/lateral lean, Sit to/from stand Toileting - Clothing Manipulation Details (indicate cue type and reason): Pt appropriately reaching for toilet paper and using R hand to complete hygiene after toileting. Approrpiately attemting to use LUE to hold clothing while copleting toileting task. Functional mobility during ADLs: Moderate assistance, +2 for physical assistance (ambulating with +2 using modified 3 musketeer and HHA) General ADL Comments: Max A at this time for ADL. Following commands with increased time. Mod A at times to maintain trunk control due to attention, lethargy and apparent movement disorder.  Cognition: Cognition Overall Cognitive Status: Impaired/Different from baseline Arousal/Alertness: Lethargic Orientation Level: Oriented to person, Oriented to place, Oriented to time Attention: Focused, Sustained Focused Attention: Impaired Focused Attention Impairment: Verbal basic, Functional basic Sustained Attention: Impaired Sustained Attention Impairment: Verbal basic, Functional basic Memory: Impaired Memory Impairment: Retrieval deficit, Decreased recall of new information Awareness: Impaired Awareness Impairment: Intellectual impairment, Emergent impairment, Anticipatory impairment Problem Solving: Impaired Problem Solving Impairment: Verbal basic, Functional basic Executive Function: Initiating Initiating: Impaired Initiating Impairment: Verbal basic, Functional basic Behaviors: Other (comment) (RN reports pt intermittently restless) Safety/Judgment: Impaired [place]  [employer] Scales of Cognitive Functioning:  Confused/appropriate Cognition Arousal/Alertness: Awake/alert Behavior During Therapy: WFL for tasks assessed/performed Overall Cognitive Status: Impaired/Different from baseline Area of Impairment: Orientation, Attention, Problem solving, [place]o level Orientation Level: Disoriented to, Place, Time Current Attention Level: Sustained Memory: Decreased recall of precautions Following Commands: Follows one step commands consistently Safety/Judgement: Decreased awareness of safety Awareness: Emergent Problem Solving: Slow processing General Comments: improve ability to sequence tasks, patient able to engage in conversation, noting need for use of bathroom, able to demonstrate improvements in awareness during functional tasks  Physical Exam: Blood pressure 117/41, pulse 69, temperature 98 F (36.7 C), temperature source Oral, resp. rate 20, height 5' 4"  (1.626 m), weight 51.302 kg (113 lb 1.6 oz), SpO2 98 %. Physical Exam  Vitals reviewed. Constitutional:  69 year old right handed frail female.  HENT:  Head: Normocephalic.  Eyes: EOM are normal. Pupils are equal, round, and reactive to light.  Negative nystagmus  Neck: Normal range of motion. Neck supple. No tracheal deviation present. No thyromegaly present.  Cardiovascular: Normal rate and regular rhythm.   No murmur heard. Respiratory: Effort normal and breath sounds normal. No respiratory distress. She has no wheezes. She has no rales.  GI: Soft. Bowel sounds are normal. She exhibits no distension. There is no tenderness. There is no rebound.  Musculoskeletal: She exhibits tenderness. She exhibits no edema.  She moves all extremities  Neurological: Coordination abnormal.  Patient is alert flat effect. She does make eye contact with examiner. She would not initiate conversation but she was able to provide her name, age and date of birth. Limited awareness of her deficits. She did follow 2 and 3 step simple commands.   Skin: Skin is  warm and dry.  Psychiatric:  Confused, limited participation in my exam    Results for orders placed or performed during the hospital encounter of 10/14/14 (from the past 48 hour(s))  Troponin I (q 6hr x 3)     Status: Abnormal   Collection Time: 10/24/14 11:28 PM  Result Value Ref Range   Troponin I 0.05 (H) <0.031 ng/mL    Comment:        PERSISTENTLY INCREASED TROPONIN VALUES IN THE RANGE OF 0.04-0.49 ng/mL CAN BE SEEN IN:       -UNSTABLE ANGINA       -CONGESTIVE HEART FAILURE       -MYOCARDITIS       -CHEST TRAUMA       -ARRYHTHMIAS       -LATE PRESENTING MYOCARDIAL INFARCTION       -COPD   CLINICAL FOLLOW-UP RECOMMENDED.   Basic metabolic panel     Status: Abnormal   Collection Time: 10/25/14  6:51 AM  Result Value Ref Range   Sodium 133 (L) 135 - 145 mmol/L   Potassium 4.1 3.5 - 5.1 mmol/L   Chloride 101 96 - 112 mmol/L   CO2 23 19 - 32 mmol/L   Glucose, Bld 158 (H) 70 - 99 mg/dL   BUN 16 6 - 23 mg/dL   Creatinine, Ser 0.60 0.50 - 1.10 mg/dL   Calcium 8.7 8.4 - 10.5 mg/dL   GFR calc non Af Amer >90 >90 mL/min   GFR calc Af Amer >90 >90 mL/min    Comment: (NOTE) The eGFR has been calculated using the CKD EPI equation. This calculation has not been validated in all clinical situations. eGFR's persistently <90 mL/min signify possible Chronic Kidney Disease.    Anion gap 9 5 - 15  CBC     Status: Abnormal   Collection Time: 10/25/14  6:51 AM  Result Value Ref Range   WBC 5.2 4.0 - 10.5 K/uL   RBC 3.37 (L) 3.87 - 5.11 MIL/uL   Hemoglobin 9.3 (L) 12.0 - 15.0 g/dL   HCT 28.7 (L) 36.0 - 46.0 %   MCV 85.2 78.0 - 100.0 fL   MCH 27.6 26.0 - 34.0 pg   MCHC 32.4 30.0 - 36.0 g/dL   RDW 14.1 11.5 - 15.5 %   Platelets 167 150 - 400 K/uL  Troponin I (q 6hr x 3)     Status: Abnormal   Collection Time: 10/25/14  6:51 AM  Result Value Ref Range   Troponin I 0.04 (H) <0.031 ng/mL    Comment:        PERSISTENTLY INCREASED TROPONIN VALUES IN THE RANGE OF 0.04-0.49 ng/mL  CAN BE SEEN IN:       -UNSTABLE ANGINA       -CONGESTIVE HEART FAILURE       -MYOCARDITIS       -CHEST TRAUMA       -ARRYHTHMIAS       -LATE PRESENTING MYOCARDIAL INFARCTION       -COPD   CLINICAL FOLLOW-UP RECOMMENDED.    No results found.     Medical Problem List and Plan: 1. Functional deficits secondary to TBI after a fall with left frontal, left temporal bleed and right cerebellar contusion as well as right basal skull fractures 2.  DVT Prophylaxis/Anticoagulation: SCDs. Monitor for any signs of DVT 3. Pain Management: Tylenol as needed 4. Mood/bipolar disorder/dementia: Celexa 20 mg daily, Seroquel 300 mg twice a day, Ativan every 4 hours as needed. Discussed baseline with husband 5. Neuropsych: This patient is not capable of making decisions on her own behalf. 6. Skin/Wound Care: Routine skin checks 7. Fluids/Electrolytes/Nutrition: Strict I and O follow-up chemistries 8. Seizure disorder. Keppra 500 mg twice a day. Monitor for any seizure activity 9. Dysphagia. Dysphagia 100 thick liquids. Monitor hydration 10. Hypertension. Lasix 20 mg daily, lisinopril 2.5 mg daily. Monitor with increased mobility 11. Urinary frequency. Enablex 0.5 mg daily, Flomax 0.4 mg daily at bedtime. Check PVR 3  Post Admission Physician Evaluation: 1. Functional deficits secondary  to TBI with skull fractures after fall. 2. Patient is admitted to receive collaborative, interdisciplinary care between the physiatrist, rehab nursing staff, and therapy team. 3. Patient's level of medical complexity and substantial therapy needs in context of that medical necessity cannot be provided at a lesser intensity of care such as a SNF. 4. Patient has experienced substantial functional loss from his/her baseline which was documented above under the "Functional History" and "Functional Status" headings.  Judging by the patient's diagnosis, physical exam, and functional history, the patient has potential for  functional progress which will result in measurable gains while on inpatient rehab.  These gains will be of substantial and practical use upon discharge  in facilitating mobility and self-care at the household level. 5. Physiatrist will provide 24 hour management of medical needs as well as oversight of the therapy plan/treatment and provide guidance as appropriate regarding the interaction of the two. 6. 24 hour rehab nursing will assist with bladder management, bowel management, safety, skin/wound care, disease management, medication administration and pain management  and help integrate therapy concepts, techniques,education, etc. 7. PT will assess and treat for/with: Lower extremity strength, range of motion, stamina, balance, functional mobility, safety, adaptive techniques and equipment, NMR, cognitive behavioral mgt.  Goals are: min to mod assist. 8. OT will assess and treat for/with: ADL's, functional mobility, safety, upper extremity strength, adaptive techniques and equipment, NMR, cognitive behavioral mgt, education.   Goals are: minto mod assist. Therapy may not proceed with showering this patient. 9. SLP will assess and treat for/with: cognition, behavior, communication, swallowing.  Goals are: min to mod assist. 10. Case Management and Social Worker will assess and treat for psychological issues and discharge planning. 11. Team conference will be held weekly to assess progress toward goals and to determine barriers to discharge. 12. Patient will receive at least 3 hours of therapy per day at least 5 days per week. 13. ELOS: 20-30 days       14. Prognosis:  excellent     Meredith Staggers, MD, [place] [facility] 10/25/2014   10/25/2014

## 2014-10-26 NOTE — Consult Note (Signed)
Ranelle Oyster, MD Physician Signed Physical Medicine and Rehabilitation Consult Note 10/22/2014 3:37 PM  Related encounter: ED to Hosp-Admission (Discharged) from 10/14/2014 in [facility]    Expand All Collapse All        Physical Medicine and Rehabilitation Consult Reason for Consult:confusion, gait deficits Referring Physician: Blake Divine   HPI: Sharon [name] is a 69 y.o. female who presented after a fall with left frontal, L temporal bleed &R cerebellar contusion, R basal skull fxs, and post traumatic seizures. Hospital course also significant for UTI. Pt making progress in therapy, displaying increased activity tolerance. It was felt that she might benefit from an inpatient rehab admission; therefore and inpatient rehab consult was requested.    ROS Past Medical History  Diagnosis Date  . Bipolar 1 disorder   . Dementia   . Other pancytopenia 06/03/2014  . Stroke    Past Surgical History  Procedure Laterality Date  . Cholecystectomy     Family History  Problem Relation Age of Onset  . Cancer Mother   . Heart attack Father    Social History:  reports that she has never smoked. She has never used smokeless tobacco. She reports that she does not drink alcohol or use illicit drugs. Allergies: No Known Allergies Medications Prior to Admission  Medication Sig Dispense Refill  . acetaminophen (TYLENOL) 500 MG tablet Take 500 mg by mouth at bedtime as needed for moderate pain.    Marland Kitchen aspirin EC 81 MG tablet Take 81 mg by mouth at bedtime.    . Cholecalciferol (VITAMIN D3) 1000 UNITS CAPS Take 1 capsule by mouth at bedtime as needed.     . citalopram (CELEXA) 40 MG tablet Take 20 mg by mouth at bedtime.   1  . Fish Oil-Cholecalciferol (FISH OIL + D3) 1200-1000 MG-UNIT CAPS Take 1,200 mg by mouth at bedtime.     Marland Kitchen FLUZONE HIGH-DOSE 0.5 ML SUSY Inject 1 each into the skin once.    0  . QUEtiapine (SEROQUEL) 300 MG tablet Take 300 mg by mouth 2 (two) times daily.     . tamsulosin (FLOMAX) 0.4 MG CAPS capsule Take 0.4 mg by mouth at bedtime.   3  . trospium (SANCTURA) 20 MG tablet Take 20 mg by mouth 2 (two) times daily.   11  . vitamin B-12 (CYANOCOBALAMIN) 500 MCG tablet Take 1,000 mcg by mouth at bedtime.    Marland Kitchen zinc sulfate 220 MG capsule Take 220 mg by mouth daily.    . ferrous sulfate 325 (65 FE) MG EC tablet Take 1 tablet (325 mg total) by mouth 2 (two) times daily after a meal. 60 tablet 3    Home: Home Living Family/patient expects to be discharged to:: Skilled nursing facility Living Arrangements: Spouse/significant other Available Help at Discharge: Family Type of Home: House Additional Comments: Pt unable to provide information related to home situationa nd available assist; We do know that she lives with her husband, who was unavailable at time of eval; Given PMH and current functuional status, SNF for rehab is a safe, reasonable option Lives With: Spouse  Functional History: Prior Function Level of Independence: Needs assistance Gait / Transfers Assistance Needed: independent ADL's / Homemaking Assistance Needed: Husband provided S for ADL. Husband states wife spent alot of time "rocking" in a chair./ had nights/days confused at baseline Comments: Husband states pt had "movement problems" PTA. ? dyskinesia Functional Status:  Mobility: Bed Mobility Overal bed mobility: Needs Assistance Bed  Mobility: Supine to Sit, Sit to Supine Supine to sit: Min assist Sit to supine: Supervision General bed mobility comments: Pt appropriately reclining self into bed Transfers Overall transfer level: Needs assistance Equipment used: (wrap around with gait belt) Transfers: Sit to/from Stand, Stand Pivot Transfers Sit to Stand: Mod assist, +2 physical assistance Stand pivot transfers: Mod assist, +2 physical  assistance Lateral/Scoot Transfers: Total assist, +2 safety/equipment, +2 physical assistance General transfer comment: Pt with L sided weakness. Improved ability to maintain control of LLE. Facilitation through trunk at rib cage and scapula to increase upright posture during mobility. Ambulation/Gait Ambulation/Gait assistance: Mod assist, +2 physical assistance (+2 wrap around support with gait belt) Ambulation Distance (Feet): 160 Feet Assistive device: 2 person hand held assist Gait Pattern/deviations: Step-through pattern, Decreased stride length, Drifts right/left, Narrow base of support Gait velocity: decreased Gait velocity interpretation: Below normal speed for age/gender General Gait Details: instability noted, improved control of abnormal movements this session. Patient ambulated with 1 seated rest break.     ADL: ADL Overall ADL's : Needs assistance/impaired Eating/Feeding: Moderate assistance, Sitting Eating/Feeding Details (indicate cue type and reason): honey thick liquid; pt able to hold cup and bring to mouth. able to monitor sip size Grooming: Moderate assistance Grooming Details (indicate cue type and reason): Pt able to direct care, stating " I need to turn the water on", i need a towel. Weakness noted L hand during functional tasks. Upper Body Bathing: Moderate assistance Lower Body Bathing: Moderate assistance, Bed level Upper Body Dressing : Moderate assistance Upper Body Dressing Details (indicate cue type and reason): pt assisting with reaching through armhomes. Trying to tie strings Toilet Transfer: +2 for physical assistance, Moderate assistance Toilet Transfer Details (indicate cue type and reason): for safety Toileting- Clothing Manipulation and Hygiene: Moderate assistance, Sitting/lateral lean, Sit to/from stand Toileting - Clothing Manipulation Details (indicate cue type and reason): Pt appropriately reaching for toilet paper and using R hand to complete  hygiene after toileting. Approrpiately attemting to use LUE to hold clothing while copleting toileting task. Functional mobility during ADLs: Moderate assistance, +2 for physical assistance (ambulating with +2 using modified 3 musketeer and HHA) General ADL Comments: Max A at this time for ADL. Following commands with increased time. Mod A at times to maintain trunk control due to attention, lethargy and apparent movement disorder.  Cognition: Cognition Overall Cognitive Status: Impaired/Different from baseline Arousal/Alertness: Lethargic Orientation Level: Oriented to place, Oriented to person Attention: Focused, Sustained Focused Attention: Impaired Focused Attention Impairment: Verbal basic, Functional basic Sustained Attention: Impaired Sustained Attention Impairment: Verbal basic, Functional basic Memory: Impaired Memory Impairment: Retrieval deficit, Decreased recall of new information Awareness: Impaired Awareness Impairment: Intellectual impairment, Emergent impairment, Anticipatory impairment Problem Solving: Impaired Problem Solving Impairment: Verbal basic, Functional basic Executive Function: Initiating Initiating: Impaired Initiating Impairment: Verbal basic, Functional basic Behaviors: Other (comment) (RN reports pt intermittently restless) Safety/Judgment: Impaired Rancho Mirant Scales of Cognitive Functioning: Confused/appropriate Cognition Arousal/Alertness: Awake/alert Behavior During Therapy: WFL for tasks assessed/performed Overall Cognitive Status: Impaired/Different from baseline Area of Impairment: Orientation, Attention, Problem solving, Rancho level Orientation Level: Disoriented to, Place, Time Current Attention Level: Sustained Memory: Decreased recall of precautions Following Commands: Follows one step commands consistently Safety/Judgement: Decreased awareness of safety Awareness: Emergent Problem Solving: Slow processing General Comments:  improve ability to sequence tasks, patient able to engage in conversation, noting need for use of bathroom, able to demonstrate improvements in awareness during functional tasks  Blood pressure 115/48, pulse 81, temperature 97.5 F (36.4 C),  temperature source Oral, resp. rate 20, height  (1.626 m), weight 49.442 kg (109 lb), SpO2 93 %. Physical Exam   Lab Results Last 24 Hours    Results for orders placed or performed during the hospital encounter of 10/14/14 (from the past 24 hour(s))  Basic metabolic panel Status: Abnormal   Collection Time: 10/22/14 6:12 AM  Result Value Ref Range   Sodium 142 135 - 145 mmol/L   Potassium 3.2 (L) 3.5 - 5.1 mmol/L   Chloride 104 96 - 112 mmol/L   CO2 32 19 - 32 mmol/L   Glucose, Bld 151 (H) 70 - 99 mg/dL   BUN 20 6 - 23 mg/dL   Creatinine, Ser 1.61 0.50 - 1.10 mg/dL   Calcium 8.0 (L) 8.4 - 10.5 mg/dL   GFR calc non Af Amer 87 (L) >90 mL/min   GFR calc Af Amer >90 >90 mL/min   Anion gap 6 5 - 15  CBC Status: Abnormal   Collection Time: 10/22/14 6:12 AM  Result Value Ref Range   WBC 2.8 (L) 4.0 - 10.5 K/uL   RBC 3.07 (L) 3.87 - 5.11 MIL/uL   Hemoglobin 8.8 (L) 12.0 - 15.0 g/dL   HCT 09.6 (L) 04.5 - 40.9 %   MCV 87.0 78.0 - 100.0 fL   MCH 28.7 26.0 - 34.0 pg   MCHC 33.0 30.0 - 36.0 g/dL   RDW 81.1 91.4 - 78.2 %   Platelets 144 (L) 150 - 400 K/uL  Magnesium Status: None   Collection Time: 10/22/14 6:12 AM  Result Value Ref Range   Magnesium 1.6 1.5 - 2.5 mg/dL      Imaging Results (Last 48 hours)    Dg Chest Port 1 View  10/21/2014 CLINICAL DATA: Pulmonary edema. EXAM: PORTABLE CHEST - 1 VIEW COMPARISON: 10/20/2014. FINDINGS: Mediastinum and hilar structures are stable. Persistent cardiomegaly. Persistent but improving bilateral pulmonary infiltrates/ edema. Interim clearing of pleural effusions. No  pneumothorax. IMPRESSION: Congestive heart failure with pulmonary edema. Interim improvement from prior exam. Electronically Signed By: Maisie Fus Register On: 10/21/2014 08:10   Dg Chest Port 1 View  10/20/2014 CLINICAL DATA: A vomiting, pancytopenia, history of CVA, possible pneumonia. EXAM: PORTABLE CHEST - 1 VIEW COMPARISON: None. FINDINGS: The lungs are well-expanded. The interstitial markings are increased diffusely. There are bilateral pleural effusions and a partially included in the field of view. The cardiac silhouette is enlarged. The pulmonary vascularity is indistinct. The bones are osteopenic. 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 Bibasilar atelectasis and/or small pleural effusions are present. When the patient can tolerate the procedure, a PA and lateral chest x-ray would be useful. Electronically Signed By: David Swaziland On: 10/20/2014 15:52   Dg Swallowing Func-speech Pathology  10/21/2014 Objective Swallowing Evaluation: Completed by Leonia Corona, SLP Student, Supervised and reviewed by Harlon Ditty MA CCC-SLP Patient Details Name: Sharon [name] MRN: [id number] Date of Birth: [birth year] Today's Date: 10/21/2014 Time: SLP Start Time (ACUTE ONLY): 1137-SLP Stop Time (ACUTE ONLY): 1155 SLP Time Calculation (min) (ACUTE ONLY): 18 min Past Medical History: Past Medical History Diagnosis Date . Bipolar 1 disorder . Dementia . Other pancytopenia 06/03/2014 . Stroke Past Surgical History: Past Surgical History Procedure Laterality Date . Cholecystectomy HPI: HPI: Pt is a 69 y/o female with PMH of advanced dementia and bipolar disorder who came in to [facility] following a fall at home and seizure activity. Ct revealed hemorrhagic contusion in the R cerebellum. Pt has no prior hx of dysphagia.  No Data Recorded Assessment / Plan / Recommendation CHL IP CLINICAL IMPRESSIONS 10/21/2014 Dysphagia Diagnosis  Mild oral phase dysphagia;Moderate oral phase dysphagia Clinical impression Pt presents with moderate oral and moderate pharyngeal phase dysphagia characterized by slow oral transit, base of tongue weakness and delayed swallow initiation to the pyriforms.Pt aspirated with thin and nectar thick liquids during the swallow. Sensation intermittent. Mild-moderate amount of apirate ; not expelled despite cue to cough. No aspiration observed with teaspoon amounts of honey-thick liquids. Pt still at risk for aspiration given delayed swallow initiation, reduced airway protection and reduced sensation. Recommend pt initiate dys 1/ honey thick liquid diet; only teaspoon amounts of honey; meds whole in puree. Speech will follow-up with diet tolerance. CHL IP TREATMENT RECOMMENDATION 10/21/2014 Treatment Plan Recommendations Therapy as outlined in treatment plan below CHL IP DIET RECOMMENDATION 10/21/2014 Diet Recommendations Dysphagia 1 (Puree);Honey-thick liquid Liquid Administration via Spoon Medication Administration Whole meds with puree Compensations Slow rate;Small sips/bites Postural Changes and/or Swallow Maneuvers Seated upright 90 degrees CHL IP OTHER RECOMMENDATIONS 10/21/2014 Recommended Consults (None) Oral Care Recommendations Oral care Q4 per protocol Other Recommendations Order thickener from pharmacy CHL IP FOLLOW UP RECOMMENDATIONS 10/21/2014 Follow up Recommendations Inpatient Rehab CHL IP FREQUENCY AND DURATION 10/21/2014 Speech Therapy Frequency (ACUTE ONLY) min 3x week Treatment Duration 2 weeks Pertinent Vitals/Pain NA SLP Swallow Goals No flowsheet data found. No flowsheet data found. CHL IP REASON FOR REFERRAL 10/21/2014 Reason for Referral Objectively evaluate swallowing function CHL IP ORAL PHASE 10/21/2014 Lips (None) Tongue (None) Mucous membranes (None) Nutritional status (None) Other (None) Oxygen therapy (None) Oral Phase Impaired  Oral - Pudding Teaspoon (None) Oral - Pudding Cup (None) Oral - Honey Teaspoon Delayed oral transit Oral - Honey Cup Delayed oral transit Oral - Honey Syringe (None) Oral - Nectar Teaspoon (None) Oral - Nectar Cup (None) Oral - Nectar Straw Delayed oral transit Oral - Nectar Syringe (None) Oral - Ice Chips (None) Oral - Thin Teaspoon Other (Comment) Oral - Thin Cup Other (Comment) Oral - Thin Straw Other (Comment) Oral - Thin Syringe (None) Oral - Puree Delayed oral transit Oral - Mechanical Soft (None) Oral - Regular Delayed oral transit Oral - Multi-consistency (None) Oral - Pill (None) Oral Phase - Comment (None) CHL IP PHARYNGEAL PHASE 10/21/2014 Pharyngeal Phase Impaired Pharyngeal - Pudding Teaspoon (None) Penetration/Aspiration details (pudding teaspoon) (None) Pharyngeal - Pudding Cup (None) Penetration/Aspiration details (pudding cup) (None) Pharyngeal - Honey Teaspoon Delayed swallow initiation;Premature spillage to pyriform sinuses Penetration/Aspiration details (honey teaspoon) (None) Pharyngeal - Honey Cup Delayed swallow initiation;Premature spillage to pyriform sinuses Penetration/Aspiration details (honey cup) (None) Pharyngeal - Honey Syringe (None) Penetration/Aspiration details (honey syringe) (None) Pharyngeal - Nectar Teaspoon (None) Penetration/Aspiration details (nectar teaspoon) (None) Pharyngeal - Nectar Cup Penetration/Aspiration before swallow Penetration/Aspiration details (nectar cup) Material enters airway, passes BELOW cords and not ejected out despite cough attempt by patient Pharyngeal - Nectar Straw Delayed swallow initiation;Premature spillage to pyriform sinuses;Penetration/Aspiration before swallow Penetration/Aspiration details (nectar straw) Material enters airway, passes BELOW cords and not ejected out despite cough attempt by patient Pharyngeal - Nectar Syringe (None) Penetration/Aspiration details (nectar syringe) (None)  Pharyngeal - Ice Chips (None) Penetration/Aspiration details (ice chips) (None) Pharyngeal - Thin Teaspoon Delayed swallow initiation;Premature spillage to pyriform sinuses;Penetration/Aspiration before swallow Penetration/Aspiration details (thin teaspoon) Material enters airway, passes BELOW cords and not ejected out despite cough attempt by patient Pharyngeal - Thin Cup Delayed swallow initiation;Premature spillage to pyriform sinuses;Penetration/Aspiration before swallow Penetration/Aspiration details (thin cup) Material enters airway, passes BELOW cords and not ejected  out despite cough attempt by patient Pharyngeal - Thin Straw Delayed swallow initiation;Premature spillage to pyriform sinuses;Penetration/Aspiration before swallow Penetration/Aspiration details (thin straw) Material enters airway, passes BELOW cords and not ejected out despite cough attempt by patient Pharyngeal - Thin Syringe (None) Penetration/Aspiration details (thin syringe') (None) Pharyngeal - Puree Delayed swallow initiation;Premature spillage to valleculae;Pharyngeal residue - valleculae Penetration/Aspiration details (puree) (None) Pharyngeal - Mechanical Soft (None) Penetration/Aspiration details (mechanical soft) (None) Pharyngeal - Regular Delayed swallow initiation;Premature spillage to valleculae;Pharyngeal residue - valleculae Penetration/Aspiration details (regular) (None) Pharyngeal - Multi-consistency (None) Penetration/Aspiration details (multi-consistency) (None) Pharyngeal - Pill Delayed swallow initiation;Premature spillage to valleculae Penetration/Aspiration details (pill) (None) Pharyngeal Comment (None) CHL IP CERVICAL ESOPHAGEAL PHASE 10/21/2014 Cervical Esophageal Phase WFL Pudding Teaspoon (None) Pudding Cup (None) Honey Teaspoon (None) Honey Cup (None) Honey Syringe (None) Nectar Teaspoon (None) Nectar Cup (None) Nectar Straw (None) Nectar Syringe (None) Thin Teaspoon  (None) Thin Cup (None) Thin Straw (None) Thin Syringe (None) Cervical Esophageal Comment (None) No flowsheet data found. DeBlois, Riley NearingBonnie Caroline 10/21/2014, 2:00 PM     Assessment/Plan: Diagnosis: TBI 1. Does the need for close, 24 hr/day medical supervision in concert with the patient's rehab needs make it unreasonable for this patient to be served in a less intensive setting? Yes 2. Co-Morbidities requiring supervision/potential complications: See above 3. Due to bladder management, bowel management, safety, skin/wound care, disease management, medication administration, pain management and patient education, does the patient require 24 hr/day rehab nursing? Yes 4. Does the patient require coordinated care of a physician, rehab nurse, PT (1-2 hrs/day, 5 days/week), OT (1-2 hrs/day, 5 days/week) and SLP (1-2 hrs/day, 5 days/week) to address physical and functional deficits in the context of the above medical diagnosis(es)? Yes Addressing deficits in the following areas: balance, endurance, locomotion, strength, transferring, bowel/bladder control, bathing, dressing, feeding, grooming, toileting, cognition, speech, language and psychosocial support 5. Can the patient actively participate in an intensive therapy program of at least 3 hrs of therapy per day at least 5 days per week? Yes 6. The potential for patient to make measurable gains while on inpatient rehab is excellent 7. Anticipated functional outcomes upon discharge from inpatient rehab are supervision with PT, supervision with OT, supervision and min assist with SLP. 8. Estimated rehab length of stay to reach the above functional goals is: 13-18 days 9. Does the patient have adequate social supports and living environment to accommodate these discharge functional goals? Yes and Potentially 10. Anticipated D/C setting: Home 11. Anticipated post D/C treatments: HH therapy 12. Overall Rehab/Functional Prognosis:  excellent  RECOMMENDATIONS: This patient's condition is appropriate for continued rehabilitative care in the following setting: CIR Patient has agreed to participate in recommended program. Potentially Note that insurance prior authorization may be required for reimbursement for recommended care.  Comment: Rehab Admissions Coordinator to follow up.  Thanks,  Ranelle OysterZachary T. Swartz, MD, [place] DomFAAPMR     10/22/2014       Routing History     Date/Time From To Method   10/22/2014 3:40 PM Ranelle OysterZachary T Swartz, MD Ranelle OysterZachary T Swartz, MD In Basket   10/22/2014 3:40 PM Ranelle OysterZachary T Swartz, MD Creola CornJohn Russo, MD Fax

## 2014-10-26 NOTE — Plan of Care (Signed)
Problem: RH BLADDER ELIMINATION Goal: RH STG MANAGE BLADDER WITH ASSISTANCE STG Manage Bladder With Max Assistance  Outcome: Not Progressing Required I+O cath for urinary retention

## 2014-10-26 NOTE — Progress Notes (Signed)
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 Date: A & B: 01-21-80 Deduct: $1288 Out of Pocket Max: none Life Max: unlimited CIR: 100% SNF: 100% days 1-20; 80% days 21-100 (100 days visit max) Outpatient: 80% Co-Pay: 20% Home Health: 100% Co-Pay: none DME: 80% Co-Pay: 20% Providers: pt's preference  SECONDARY: BCBS/Federal Emp PPO Policy#: [id number] Subscriber: pt's spouse Christiana Gurevich Benefits: Phone #: [phone]   Emergency Contact Information Contact Information    Name Relation Home Work [place] Spouse [phone]    [place], [place] "Stephens November   [phone]     Current Medical History  Patient Admitting Diagnosis: TBI s/p fall, confusion, gait deficits  History of Present Illness: Sharon [name] is a 69 y.o. female who presented after a fall with left frontal, L temporal bleed &R cerebellar contusion, R basal skull fxs, and post traumatic seizures. Hospital course also significant for UTI. Pt making progress in therapy, displaying increased activity tolerance. It was felt that she might benefit from an inpatient rehab admission; therefore and inpatient rehab consult was requested.     NIH Total: 7  Past Medical History  Past Medical History  Diagnosis Date  . Bipolar 1 disorder   . Dementia   . Other pancytopenia 06/03/2014  . Stroke     Family History  family history includes Cancer in her mother; Heart attack in her father.  Prior Rehab/Hospitalizations: none  Current Medications   Current facility-administered medications:  . acetaminophen (TYLENOL) tablet 500 mg, 500 mg, Oral, QHS PRN, Consuella Lose, MD, 500 mg at 10/18/14 1959 . antiseptic oral rinse (CPC / CETYLPYRIDINIUM CHLORIDE 0.05%) solution 7 mL, 7 mL, Mouth Rinse, BID, Consuella Lose, MD, 7 mL at 10/25/14 1000 . cholecalciferol (VITAMIN D) tablet 1,000 Units, 1,000 Units, Oral, QHS, Consuella Lose, MD, 1,000 Units at 10/24/14 2200 . citalopram (CELEXA) tablet 20 mg, 20 mg, Oral, QHS, Consuella Lose, MD, 20 mg at 10/24/14 2201 . cyanocobalamin tablet 1,000 mcg, 1,000 mcg, Oral, QHS, Consuella Lose, MD, 1,000 mcg at 10/24/14 2201 . darifenacin (ENABLEX) 24 hr tablet 7.5 mg, 7.5 mg, Oral, Daily, Consuella Lose, MD, 7.5 mg at 10/25/14 1000 . feeding supplement (ENSURE ENLIVE) (ENSURE ENLIVE) liquid 237 mL, 237 mL, Oral, BID BM, Baird Lyons, RD, 237 mL at 10/25/14 1000 . ferrous sulfate 300 (60 FE) MG/5ML syrup 300 mg, 300 mg, Oral, BID WC, Consuella Lose, MD, 300 mg at 10/25/14 0851 . furosemide (LASIX) tablet 20 mg, 20 mg, Oral, Daily, Hosie Poisson, MD, 20 mg at 10/25/14 1000 . ipratropium-albuterol (DUONEB) 0.5-2.5 (3) MG/3ML nebulizer solution 3 mL, 3 mL, Nebulization, Q2H PRN, Chesley Mires, MD . levETIRAcetam (KEPPRA) tablet 500 mg, 500 mg, Oral, BID, Valeda Malm Rumbarger, RPH, 500 mg at 10/25/14 1000 . lisinopril (PRINIVIL,ZESTRIL) tablet 2.5 mg, 2.5 mg, Oral, Daily, Hosie Poisson, MD, 2.5 mg at 10/25/14 1000 . LORazepam (ATIVAN) injection 0.5 mg, 0.5 mg, Intravenous, Q4H PRN, Wilhelmina Mcardle, MD, 0.5 mg at 10/21/14 2347 .  pantoprazole sodium (PROTONIX)  40 mg/20 mL oral suspension 40 mg, 40 mg, Per Tube, Q1200, Kara Mead V, MD, 40 mg at 10/25/14 1229 . QUEtiapine (SEROQUEL) tablet 150 mg, 150 mg, Oral, BID, Hosie Poisson, MD . RESOURCE THICKENUP CLEAR, , Oral, PRN, Tessa Lerner, MD . sodium chloride 0.9 % 1,000 mL infusion, , Intravenous, Continuous, Corey Harold, NP, Last Rate: 10 mL/hr at 10/20/14 2136 . tamsulosin (FLOMAX) capsule 0.4 mg, 0.4 mg, Oral, QHS, Consuella Lose, MD, 0.4 mg at 10/24/14 2202 . white petrolatum (VASELINE) gel 1 application, 1 application, Topical, PRN, Consuella Lose, MD, 1 application at 76/19/50 1531 . zinc sulfate capsule 220 mg, 220 mg, Oral, Daily, Consuella Lose, MD, 220 mg at 10/25/14 1000  Patients Current Diet: DIET - DYS 1 Room service appropriate?: Yes; Fluid consistency:: Honey Thick Diet - low sodium heart healthy, only teaspoon amount of honey, meds whole in puree, intermittent supervision (but husband states that the pt eats rather quickly and needs supervision)  Precautions / Restrictions Precautions Precautions: Fall Precaution Comments: apparent movement disorder PTA Restrictions Weight Bearing Restrictions: No   Prior Activity Level Limited Community (1-2x/wk): Pt got out every Friday for her hair appointment and went on limited errands with husband. Husband does all the chores/errands. Husband assists her into the tub and she washes herself . She did need supervision during meal time because she often ate too quickly.   Home Assistive Devices / Equipment Home Assistive Devices/Equipment: None  Prior Functional Level Prior Function Level of Independence: Needs assistance Gait / Transfers Assistance Needed: independent ADL's / Homemaking Assistance Needed: Husband provided S for ADL. Husband states wife spent alot of time "rocking" in a chair./ had nights/days confused at baseline Comments: Husband states pt had "movement problems" PTA. ?  dyskinesia. 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 "She's not very agile at her usual" per husband.  Current Functional Level Cognition  Arousal/Alertness: Lethargic Overall Cognitive Status: Impaired/Different from baseline Current Attention Level: Sustained Orientation Level: Oriented to person, Oriented to place, Oriented to time Following Commands: Follows one step commands consistently Safety/Judgement: Decreased awareness of safety General Comments: improve ability to sequence tasks, patient able to engage in conversation, noting need for use of bathroom, able to demonstrate improvements in awareness during functional tasks Attention: Focused, Sustained Focused Attention: Impaired Focused Attention Impairment: Verbal basic, Functional basic Sustained Attention: Impaired Sustained Attention Impairment: Verbal basic, Functional basic Memory: Impaired Memory Impairment: Retrieval deficit, Decreased recall of new information Awareness: Impaired Awareness Impairment: Intellectual impairment, Emergent impairment, Anticipatory impairment Problem Solving: Impaired Problem Solving Impairment: Verbal basic, Functional basic Executive Function: Initiating Initiating: Impaired Initiating Impairment: Verbal basic, Functional basic Behaviors: Other (comment) (RN reports pt intermittently restless) Safety/Judgment: Impaired [employer] Scales of Cognitive Functioning: Confused/appropriate   Extremity Assessment (includes Sensation/Coordination)  Upper Extremity Assessment: LUE deficits/detail LUE Deficits / Details: spontaneous movement. appesr to demonstrte greater proximal movemetn than distal movement. gross grasp/release LUE Coordination: decreased fine motor, decreased gross motor  Lower Extremity Assessment: Difficult to assess due to impaired cognition, LLE deficits/detail LLE Deficits / Details: Pt able to weight bear  through LLE. Appears to demonstrate motor impersistance. Demonstrates active spontaneous movement.    ADLs  Overall ADL's : Needs assistance/impaired Eating/Feeding: Moderate assistance, Sitting Eating/Feeding Details (indicate cue type and reason): honey thick liquid; pt able to hold cup and bring to mouth. able to monitor sip size Grooming: Moderate assistance Grooming Details (indicate cue type and reason):  Pt able to direct care, stating " I need to turn the water on", i need a towel. Weakness noted L hand during functional tasks. Upper Body Bathing: Moderate assistance Lower Body Bathing: Moderate assistance, Bed level Upper Body Dressing : Moderate assistance Upper Body Dressing Details (indicate cue type and reason): pt assisting with reaching through armhomes. Trying to tie strings Toilet Transfer: +2 for physical assistance, Moderate assistance Toilet Transfer Details (indicate cue type and reason): for safety Toileting- Clothing Manipulation and Hygiene: Moderate assistance, Sitting/lateral lean, Sit to/from stand Toileting - Clothing Manipulation Details (indicate cue type and reason): Pt appropriately reaching for toilet paper and using R hand to complete hygiene after toileting. Approrpiately attemting to use LUE to hold clothing while copleting toileting task. Functional mobility during ADLs: Moderate assistance, +2 for physical assistance (ambulating with +2 using modified 3 musketeer and HHA) General ADL Comments: Max A at this time for ADL. Following commands with increased time. Mod A at times to maintain trunk control due to attention, lethargy and apparent movement disorder.    Mobility  Overal bed mobility: Needs Assistance Bed Mobility: Supine to Sit, Sit to Supine Supine to sit: Min assist Sit to supine: Min assist General bed mobility comments: min assist for initiation and positional movements, assist for LE elevation back to bed    Transfers  Overall  transfer level: Needs assistance Equipment used: (wrap around with gait belt) Transfers: Sit to/from Stand Sit to Stand: Mod assist, +2 physical assistance Stand pivot transfers: Mod assist, +2 physical assistance Lateral/Scoot Transfers: Total assist, +2 safety/equipment, +2 physical assistance General transfer comment: Continues to demonstrate left lateral lean during transitional movements secondary to weakness, performed from bed, bench and toiletx3 (hygiene and pericare performed) 2 person assist for safety and stability    Ambulation / Gait / Stairs / Wheelchair Mobility  Ambulation/Gait Ambulation/Gait assistance: Mod assist, +2 physical assistance Ambulation Distance (Feet): 150 Feet Assistive device: 2 person hand held assist (wrap around support with gait belt) Gait Pattern/deviations: Step-through pattern, Decreased stride length, Ataxic, Narrow base of support Gait velocity: decreased Gait velocity interpretation: Below normal speed for age/gender General Gait Details: significant instability and ataxic movements during gait, improved with bilateral support. LE weakness noted L>R.    Posture / Balance Dynamic Sitting Balance Sitting balance - Comments: continues to demonstrate dyskinetic movements while in sitting but imrpved control, able to sit EOB for extended periods without assist, min assist required while sitting on toilet Balance Overall balance assessment: Needs assistance Sitting-balance support: Single extremity supported Sitting balance-Leahy Scale: Fair Sitting balance - Comments: continues to demonstrate dyskinetic movements while in sitting but imrpved control, able to sit EOB for extended periods without assist, min assist required while sitting on toilet Postural control: Posterior lean (difficulty with control of movement) Standing balance support: Bilateral upper extremity supported Standing balance-Leahy Scale: Poor Standing balance comment: assist  for stability (moderate) +2 during dynamic standing and hygiene     Special needs/care consideration BiPAP/CPAP no CPM no  Continuous Drip IV no  Dialysis no  Life Vest no  Oxygen no  Special Bed no  Trach Size no  Wound Vac (area) no  Skin - bruise on frontal aspect of head, bruise on her bottom per husband, scattered bruises on UE's per husband  Bowel mgmt: last BM on 10-24-14 with incontinence Bladder mgmt: urinary incontinence (husband brought Depends. Pt does not wear these at baseline) Diabetic mgmt no  Pt states pt has sun downing behaviors. Sitter was present  on acute care as well.    Previous Home Environment Living Arrangements: Spouse/significant other Lives With: Spouse Available Help at Discharge: Family Type of Home: Scissors: No Additional Comments: Pt unable to provide information related to home situationa nd available assist; We do know that she lives with her husband, who was unavailable at time of eval; Given PMH and current functuional status, SNF for rehab is a safe, reasonable option  Discharge Living Setting Plans for Discharge Living Setting: Patient's home, House Type of Home at Discharge: House Discharge Home Layout: One level Discharge Home Access: Stairs to enter Entrance Stairs-Number of Steps: 3-4 at garage entrance Discharge Bathroom Shower/Tub: Tub/shower unit Does the patient have any problems obtaining your medications?: No  Social/Family/Support Systems Patient Roles: Spouse Contact Information: husband Roxy Manns is primary contact Anticipated Caregiver: husband. Husband also shares he may be looking into getting some aide support. Anticipated Caregiver's Contact Information: see above Ability/Limitations of Caregiver: no limitations Caregiver Availability: 24/7 Discharge Plan Discussed with Primary Caregiver: Yes Is Caregiver In Agreement with Plan?: Yes Does  Caregiver/Family have Issues with Lodging/Transportation while Pt is in Rehab?: No  Goals/Additional Needs Patient/Family Goal for Rehab: Supervision with PT/OT; Supervision and Min assist with SLP Expected length of stay: 13-18 days Cultural Considerations: none Dietary Needs: Dys 1, honey thick, teaspoon, meds whole in puree, intermittent supervsion (husband states pt eats fast at baseline and needs reminders to slow down) Equipment Needs: to be determined Special Service Needs: Pt has hx of dementia and bipolar. Rehab team aware. Pt had safety sitter while on acute care and husband reports pt has been impulsive when she needs to get up and use the restroom. Husband also reports sun downing behaviors at times. Pt/Family Agrees to Admission and willing to participate: Yes (met with pt and her husband on 4-4) Program Orientation Provided & Reviewed with Pt/Caregiver Including Roles & Responsibilities: Yes   Decrease burden of Care through IP rehab admission: NA   Possible need for SNF placement upon discharge: not anticipated   Patient Condition: This patient's medical and functional status has changed since the consult dated: 10-22-14 in which the Rehabilitation Physician determined and documented that the patient's condition is appropriate for intensive rehabilitative care in an inpatient rehabilitation facility. See "History of Present Illness" (above) for medical update. Functional changes are: moderate assistance of 2 for ambulation of 150'. Patient's medical and functional status update has been discussed with the Rehabilitation physician and patient remains appropriate for inpatient rehabilitation. Will admit to inpatient rehab today.  Preadmission Screen Completed By: Nanetta Batty, PT, 10/25/2014 2:19 PM ______________________________________________________________________  Discussed status with Dr. Naaman Plummer on 10-25-14 at 1409 and received telephone approval for admission  today.  Admission Coordinator: Nanetta Batty, PT, time 1409/Date 10-25-14          Cosigned by: Meredith Staggers, MD at 10/25/2014 2:30 PM  Revision History     Date/Time User Provider Type Action   10/25/2014 2:30 PM Meredith Staggers, MD Physician Cosign   10/25/2014 2:22 PM [facility] Admission Coordinator Sign

## 2014-10-26 NOTE — Evaluation (Signed)
Occupational Therapy Assessment and Plan  Patient Details  Name: Sharon [name] MRN: [id number] Date of Birth: January 03, 1946  OT Diagnosis: abnormal posture, ataxia, cognitive deficits and muscle weakness (generalized) Rehab Potential: Rehab Potential (ACUTE ONLY): Fair ELOS: 10-14 days   Today's Date: 10/26/2014 OT Individual Time: 0700-0800 OT Individual Time Calculation (min): 60 min     Problem List:  Patient Active Problem List   Diagnosis Date Noted  . 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 Frontal lobe contusion   . Hypoxemia 10/21/2014  . Pulmonary edema   . Intracerebral hemorrhage 10/20/2014  . Intracranial hemorrhage 10/14/2014  . Other pancytopenia 06/03/2014    Past Medical History:  Past Medical History  Diagnosis Date  . Bipolar 1 disorder   . Dementia   . Other pancytopenia 06/03/2014  . Stroke    Past Surgical History:  Past Surgical History  Procedure Laterality Date  . Cholecystectomy      Assessment & Plan Clinical Impression: Sharon [name] is a 69 y.o right handed. female with history of chronic systolic congestive heart failure, dementia and bipolar disorder followed by [name] of psychiatry services. She lives with her husband and sedentary prior to admission. She was able to dress herself as well as feed herself but no meal preparation. Presented 10/14/2014 after a fall with left frontal, L temporal bleed &R cerebellar contusion, R basal skull fxs, and post traumatic seizures. Neurosurgery Dr. Joya Salm consulted with conservative care. Echocardiogram with ejection fraction of 45% no wall motion abnormalities. Hospital course also significant for UTI with antibiotics completed. Swallow study completed maintain on a dysphagia 1 honey thick liquid diet. She continues on Keppra 500 mg twice daily. Pt making progress in therapy, displaying increased activity tolerance but still very impulsive with a sitter at bedtime. It was  felt that she might benefit from an inpatient rehab admission; therefore and inpatient rehab consult was requested. Patient was admitted for comprehensive rehabilitation program. Patient transferred to CIR on 10/25/2014 .    Patient currently requires min-total assist with basic self-care skills secondary to muscle weakness, decreased cardiorespiratoy endurance, abnormal tone, unbalanced muscle activation, ataxia, decreased coordination and decreased motor planning, decreased initiation, decreased attention, decreased awareness, decreased problem solving, decreased safety awareness, decreased memory and delayed processing and decreased sitting balance, decreased standing balance, decreased postural control and decreased balance strategies.  Prior to hospitalization, patient could complete BADLs with min-supervision.  Patient will benefit from skilled intervention to decrease level of assist with basic self-care skills and increase independence with basic self-care skills prior to discharge home with care partner.  Anticipate patient will require minimal physical assistance and follow up home health.  OT - End of Session Activity Tolerance: Decreased this session Endurance Deficit: Yes Endurance Deficit Description: frequent rest breaks OT Assessment Rehab Potential (ACUTE ONLY): Fair OT Patient demonstrates impairments in the following area(s): Balance;Cognition;Safety;Perception;Sensory;Endurance;Motor;Vision OT Basic ADL's Functional Problem(s): Grooming;Dressing;Bathing;Toileting OT Transfers Functional Problem(s): Toilet;Tub/Shower OT Additional Impairment(s): Other (comment);Fuctional Use of Upper Extremity (significant cognitive deficits) OT Plan OT Intensity: Minimum of 1-2 x/day, 45 to 90 minutes OT Frequency: 5 out of 7 days OT Duration/Estimated Length of Stay: 10-14 days OT Treatment/Interventions: Balance/vestibular training;Cognitive remediation/compensation;Discharge  planning;Community reintegration;DME/adaptive equipment instruction;Functional mobility training;Neuromuscular re-education;Patient/family education;Psychosocial support;Self Care/advanced ADL retraining;Therapeutic Activities;Therapeutic Exercise;Splinting/orthotics;UE/LE Strength taining/ROM;Visual/perceptual remediation/compensation;UE/LE Coordination activities;Wheelchair propulsion/positioning OT Self Feeding Anticipated Outcome(s): n/a OT Basic Self-Care Anticipated Outcome(s): min-supervision OT Toileting Anticipated Outcome(s): min assist OT Bathroom Transfers Anticipated Outcome(s): min assist OT Recommendation Patient  destination: Home Follow Up Recommendations: 24 hour supervision/assistance;Home health OT Equipment Recommended: To be determined   Skilled Therapeutic Intervention OT eval completed. Discussed role of OT, goals of therapy, safety plan, fall risk with minimal carryover noted due to cognitive deficits (no family present). Pt received supine in bed stating name of hospital and verbalizing "falling" and hitting her head, however unable to identify deficits. Pt initiated need to toilet, completing stand pivot transfer bed>w/c and w/c<>toielt with min A and use of grab bars. Pt sitting very quickly with no awareness if seat was behind her. Pt required total A toileting with multiple trials for sit<>stand due to fatigue and decreased attention. Pt with LOB posteriorly and anteriorly 2x while sitting on toilet requiring max A to correct. Engaged in bathing at sink with pt asking for soap, wash cloth, etc, however required max cues to physically initiate task. Pt used BUE without cues throughout self-care tasks and while folding clothing items to place in drawer. Pt required max cues for sustained attention throughout session up to 15 seconds. Pt also made eye contact with therapist throughout session. Pt fatigued quickly, requesting to return to bed after first 20 min of session. Pt  left sitting in w/c with SLP present.   OT Evaluation Precautions/Restrictions  Precautions Precautions: Fall Precaution Comments: apparent movement disorder PTA (?dyskinesia) Restrictions Weight Bearing Restrictions: No General   Vital Signs Therapy Vitals Pulse Rate: 68 BP: 105/63 mmHg Patient Position (if appropriate): Lying Oxygen Therapy SpO2: 100 % O2 Device: Not Delivered Pain Pain Assessment Pain Assessment: No/denies pain Home Living/Prior Functioning Home Living Available Help at Discharge: Family Type of Home: House Additional Comments: Pt unable to provide information in regards to home setup, etc. No family present.  Lives With: Spouse Prior Function Level of Independence: Needs assistance with ADLs, Needs assistance with tranfers, Other (comment) (supervision self-care and per chart husband "assisted" with tub transfer) Comments: Husband states pt had "movement problems" PTA. ? dyskinesia ADL   Vision/Perception  Vision- History Baseline Vision/History: Wears glasses Patient Visual Report: No change from baseline Vision- Assessment Vision Assessment?: Vision impaired- to be further tested in functional context Additional Comments: unable to formally assess due to impaired cognition; pt tracked all quadrants with increased head turns and verbal cues  Cognition Overall Cognitive Status: Impaired/Different from baseline (hx of cognitive impairments at baseline as well) Arousal/Alertness: Lethargic Orientation Level: Oriented to person;Disoriented to time;Disoriented to situation;Oriented to place Sustained Attention: Impaired Sustained Attention Impairment: Verbal basic;Functional basic Memory: Impaired Memory Impairment: Decreased recall of new information;Decreased short term memory Awareness: Impaired Awareness Impairment: Intellectual impairment;Emergent impairment;Anticipatory impairment Problem Solving: Impaired Problem Solving Impairment: Verbal  basic;Functional basic Executive Function:  (all impaired) Safety/Judgment: Impaired [employer] Scales of Cognitive Functioning: Confused/inappropriate/non-agitated Sensation Sensation Light Touch: Impaired by gross assessment Hot/Cold: Appears Intact Additional Comments: difficult to assess  Coordination Gross Motor Movements are Fluid and Coordinated: No Fine Motor Movements are Fluid and Coordinated: No Coordination and Movement Description: questionable dyskinesia PTA Motor  Motor Motor: Abnormal postural alignment and control Mobility  Bed Mobility Bed Mobility: Supine to Sit Supine to Sit: 4: Min assist Supine to Sit Details: Tactile cues for initiation;Verbal cues for sequencing;Manual facilitation for weight shifting;Visual cues/gestures for sequencing Transfers Transfers: Sit to Stand;Stand to Sit Sit to Stand: 4: Min assist Sit to Stand Details: Tactile cues for initiation;Verbal cues for sequencing;Visual cues/gestures for sequencing;Manual facilitation for weight shifting Stand to Sit: 4: Min assist Stand to Sit Details (indicate cue type and  reason): Tactile cues for initiation;Verbal cues for sequencing;Visual cues/gestures for sequencing;Manual facilitation for weight shifting  Trunk/Postural Assessment  Cervical Assessment Cervical Assessment: Exceptions to [facility] (forward head) Thoracic Assessment Thoracic Assessment: Exceptions to [facility] (kyphotic) Lumbar Assessment Lumbar Assessment: Exceptions to [facility] Postural Control Postural Control: Deficits on evaluation  Balance Balance Balance Assessed: Yes Static Sitting Balance Static Sitting - Balance Support: Feet supported;Bilateral upper extremity supported Static Sitting - Level of Assistance: 5: Stand by assistance;4: Min assist Dynamic Sitting Balance Dynamic Sitting - Balance Support: Feet supported;During functional activity Dynamic Sitting - Level of Assistance: 5: Stand by assistance;2: Max  assist Sitting balance - Comments: pt with LOB anteriorly and posteriorly on 2 occasions with no initiation to correct Static Standing Balance Static Standing - Balance Support: During functional activity;Bilateral upper extremity supported Static Standing - Level of Assistance: 4: Min assist Dynamic Standing Balance Dynamic Standing - Balance Support: During functional activity Dynamic Standing - Level of Assistance: 3: Mod assist;4: Min assist (fatigued quickly; sitting unsafely without warning) Extremity/Trunk Assessment RUE Assessment RUE Assessment: Within Functional Limits (4/5 strength) LUE Assessment LUE Assessment: Exceptions to [facility] LUE Strength LUE Overall Strength Comments: Inconsistent with use of LUE d/t cognitive deficits; pt with full AROM when brushing hair, however limited initiation of movement to LUE upon command  FIM:  FIM - Grooming Grooming Steps: Wash, rinse, dry face;Wash, rinse, dry hands;Brush, comb hair Grooming: 5: Set-up assist to obtain items FIM - Bathing Bathing Steps Patient Completed: Chest;Right Arm;Left Arm;Abdomen;Right upper leg;Left upper leg Bathing: 3: Mod-Patient completes 5-7 [id number] parts or 50-74% FIM - Upper Body Dressing/Undressing Upper body dressing/undressing: 0: Wears gown/pajamas-no public clothing FIM - Lower Body Dressing/Undressing Lower body dressing/undressing steps patient completed: Pull pants up/down;Thread/unthread left pants leg Lower body dressing/undressing: 2: Max-Patient completed 25-49% of tasks FIM - Toileting Toileting: 1: Total-Patient completed zero steps, helper did all 3 FIM - Bed/Chair Transfer Bed/Chair Transfer Assistive Devices: Bed rails;Arm rests Bed/Chair Transfer: 4: Supine > Sit: Min A (steadying Pt. > 75%/lift 1 leg);4: Bed > Chair or W/C: Min A (steadying Pt. > 75%) FIM - TRadio producerDevices: Grab bars Toilet Transfers: 4-To toilet/BSC: Min A (steadying Pt. >  75%);4-From toilet/BSC: Min A (steadying Pt. > 75%)   Refer to Care Plan for Long Term Goals  Recommendations for other services: None  Discharge Criteria: Patient will be discharged from OT if patient refuses treatment 3 consecutive times without medical reason, if treatment goals not met, if there is a change in medical status, if patient makes no progress towards goals or if patient is discharged from hospital.  The above assessment, treatment plan, treatment alternatives and goals were discussed and mutually agreed upon: No family available/patient unable  PDuayne Cal4/11/2014, 12:18 PM

## 2014-10-26 NOTE — Progress Notes (Signed)
Occupational Therapy Note  Patient Details  Name: Sharon [name] MRN: [id number] Date of Birth: Oct 10, 1945  Today's Date: 10/26/2014 OT Individual Time: 1130-1200 OT Individual Time Calculation (min): 30 min   Pt denied pain Individual therapy  Pt asleep in bed upon arrival.  Pt required min multimodal cues to arouse.  Pt required tot A to orient to place and situation.  Pt unable to recall name or birth date.  Pt initially performed stand pivot transfer to w/c with mod A.  Pt was fidgety while seated in w/c and unable to maintain midline posture.  Pt transferred to recliner with feet elevated and QRB in place.  Attempted to orient patient and attend one step commands.  Pt did not respond to questions or commands.  Pt remained in recliner with staff present.   Lavone NeriLanier, Sharon [name] [facility] 10/26/2014, 2:41 PM

## 2014-10-26 NOTE — Progress Notes (Signed)
Patient information reviewed and entered into eRehab system by Adyn Hoes, RN, CRRN, PPS Coordinator.  Information including medical coding and functional independence measure will be reviewed and updated through discharge.     Per nursing patient was given "Data Collection Information Summary for Patients in Inpatient Rehabilitation Facilities with attached "Privacy Act Statement-Health Care Records" upon admission.  

## 2014-10-26 NOTE — Evaluation (Signed)
Speech Language Pathology Assessment and Plan  Patient Details  Name: Sharon [name] MRN: [id number] Date of Birth: 11-21-1945  SLP Diagnosis: Cognitive Impairments;Dysarthria;Dysphagia  Rehab Potential: Good ELOS: 10-14 days    Today's Date: 10/26/2014 SLP Individual Time: 0800-0900 SLP Individual Time Calculation (min): 60 min   Problem List:  Patient Active Problem List   Diagnosis Date Noted  . 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 Frontal lobe contusion   . Hypoxemia 10/21/2014  . Pulmonary edema   . Intracerebral hemorrhage 10/20/2014  . Intracranial hemorrhage 10/14/2014  . Other pancytopenia 06/03/2014   Past Medical History:  Past Medical History  Diagnosis Date  . Bipolar 1 disorder   . Dementia   . Other pancytopenia 06/03/2014  . Stroke    Past Surgical History:  Past Surgical History  Procedure Laterality Date  . Cholecystectomy      Assessment / Plan / Recommendation Clinical Impression Sharon [name] is a 69 y.o right handed. female with history of chronic systolic congestive heart failure, dementia and bipolar disorder followed by [name] of psychiatry services. She lives with her husband and sedentary prior to admission. She was able to dress herself as well as feed herself but no meal preparation. Presented 10/14/2014 after a fall with left frontal, L temporal bleed &R cerebellar contusion, R basal skull fxs,  and post traumatic seizures. Neurosurgery Dr. Joya Salm consulted with conservative care. Echocardiogram with ejection fraction of 45% no wall motion abnormalities. Hospital course also significant for UTI with antibiotics completed. Swallow study completed, maintain on Dys. 1 textures and honey thick liquids via teaspoon. She continues on Keppra 500 mg twice daily. Pt making progress in therapy, displaying increased activity tolerance but still very impulsive with a sitter at bedtime. It was felt that she might benefit  from an inpatient rehab admission; therefore and inpatient rehab consult was requested.  Patient was admitted for comprehensive rehabilitation program on 10/25/14 and administered a BSE and cognitive-linguistic evaluation. Patient presents with moderate oral-pharyngeal dysphagia characterized by mild lingual oral residue, impaired anterior-posterior transit, suspected delayed swallow initiation, and decreased hyolaryngeal elevation. Patient consumed trials of honey-thick liquids via teaspoon and Dys. 1 textures with intermittent overt s/s of aspiration x1 characterized by immediate subtle throat clear and demonstrated impulsivity regarding bite size; patient required Mod A verbal cues for use of small bites and slow rate. Patient was observed to take small pills whole in puree with suspected delayed swallow initiation and no overt s/s of aspiration. Due to cognitive deficits and intermittent sensation of penetrated/aspirated substances and silent aspiration of nectar-thick and thin liquids per MBS done 03/31, recommend continuing current diet with full supervision.  Patient furthermore presents with severe cognitive deficits characterized by deficits in sustained attention, basic problem-solving, short-term memory, intellectual awareness of deficits, and safety awareness. At this time patient's cognitive status relative to baseline unclear, therefore will continue to assess. Patient additionally presents with reduced speech intelligibility of 50-75% at the phrase level characterized by imprecise consonant production and low vocal intensity; per patient family this is different from baseline. The aforementioned swallowing, speech, and possible new cognitive deficits impact patient's ability to perform familiar and functional tasks safely, therefore recommend that patient receive skilled SLP intervention to increase swallow function, address cognitive deficits and speech intelligibility to enhance functional  independence and reduce caretaker burden prior to discharge.  Skilled Therapeutic Interventions          Patient administered a BSE and cognitive-linguistic evaluation;  see above for details. Will continue to assess current patient cognitive status relative to baseline.  SLP Assessment  Patient will need skilled Speech Lanaguage Pathology Services during CIR admission    Recommendations  Diet Recommendations: Dysphagia 1 (Puree);Honey-thick liquid Liquid Administration via: Spoon Medication Administration: Whole meds with puree Supervision: Full supervision/cueing for compensatory strategies;Patient able to self feed Compensations: Slow rate;Small sips/bites;Check for pocketing;Clear throat intermittently Postural Changes and/or Swallow Maneuvers: Seated upright 90 degrees;Upright 30-60 min after meal Oral Care Recommendations: Oral care BID Recommendations for Other Services: Neuropsych consult Patient destination: Home Follow up Recommendations: Home Health SLP;Skilled Nursing facility;24 hour supervision/assistance (HH vs. SNF )    SLP Frequency 3 to 5 out of 7 days   SLP Treatment/Interventions Cognitive remediation/compensation;Cueing hierarchy;Dysphagia/aspiration precaution training;Therapeutic Activities;Speech/Language facilitation;Internal/external aids;Patient/family education;Functional tasks    Pain Pain Assessment Pain Assessment: Faces Faces Pain Scale: Hurts a little bit Pain Location: Abdomen  Short Term Goals: Week 1: SLP Short Term Goal 1 (Week 1): Patient will orient to place, time and situation with use of external aids with Mod A question and verbal cues.  SLP Short Term Goal 2 (Week 1): Patient will demonstrate functional problem solving for basic and familiar self-care tasks with Max A verbal cues.  SLP Short Term Goal 3 (Week 1): Patient will sustain attention to a functional task for 5 minutes with Mod A verbal cues for redirection.  SLP Short Term Goal 4 (Week  1): Patient will consume current diet with minimal overt s/s of aspiration with Mod A verbal cues for use of swallowing compensatory strategies.  SLP Short Term Goal 5 (Week 1): Patient will utilize speech intelligibility strategies with Mod A verbal cues with 75% of observable opportunities to increase intelligibility to 75% at the phrase level.   See FIM for current functional status Refer to Care Plan for Long Term Goals  Recommendations for other services: Neuropsych  Discharge Criteria: Patient will be discharged from SLP if patient refuses treatment 3 consecutive times without medical reason, if treatment goals not met, if there is a change in medical status, if patient makes no progress towards goals or if patient is discharged from hospital.  The above assessment, treatment plan, treatment alternatives and goals were discussed and mutually agreed upon: No family available/patient unable  Servando Snare 10/27/2014, 7:45 AM

## 2014-10-26 NOTE — Progress Notes (Addendum)
Rounding sitter with patient through out the day. ~1030 am patient had episode of  Phasing out. Eyes open however did not respond to name or follow commands. Pt was with therapy and they had transferred to the toilet. Dan [place] PA notified and came to check pt. BP was lower than earlier however within few minutes of return to bed and sternal rub, pt responded sleepily but more alert like earlier in the shift. Pt was able to continue her therapy session. Not sure what sparked the episode however she was totally different and unable to respond for several minutes the way she had previously. No further episodes noted today. Continue to tolerate therapy session however wanted to sleep in chair and easily fatigued. Pt unable to void on own after several attempts to toilet . Scanned for 900; attempted I+O cath @ 1530 and pt spontaneously voided. Bladder scanned again for >500 and catheterized for 500. Pt has not been incontinent since catheterization. IVF restarted this afternoon per order. Pamelia HoitSharp, Kayvion Arneson B

## 2014-10-26 NOTE — Progress Notes (Signed)
Occupational Therapy Session Note  Patient Details  Name: Elenora GammaJoan L Starkes MRN: [id number] Date of Birth: June 03, 1946  Today's Date: 10/26/2014 OT Individual Time: 1300-1400 OT Individual Time Calculation (min): 60 min     Skilled Therapeutic Interventions/Progress Updates:    1:1 pt in recliner; curled up and asleep. Pt only oriented to name and birth month and day, not year. Unable to tell me where she is or what happened to her. Pt with increased fatigue this pm and had difficulty staying alert in session. Transferred with mod A with +2 for safety into w/c and brought into a different bright environment, the dayroom. Presented the colored block test. Requested for the pt to match colors from a field of two (match block to color on board). Pt with no initiation to complete tasks. Downgraded task to pick up a block - pt continued to not initiate follow through of task. Placed block in her hand and asked her to put it on a table- did not follow command. After a break switched to identifying object on a picture card. Pt able to name one picture out of 2 on a card before laying her head down. Able to name 5 items out of 10 (one on each card of 2). Transitioned to coming in contact with staff members she had interacted with early in the day. Pt with decreased social participation other than hello- no recognition of staff. Pt able to attend to taking a card in and out of an envelope and reading the greeting on the front with tactile cues with min A. Pt returned to bed with min A with +2 for safety at conclusion of session.   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), sits without warning Restrictions Weight Bearing Restrictions: No    Vital Signs: Therapy Vitals Temp: 97.8 F (36.6 C) Temp Source: Oral Pulse Rate: 65 Resp: 18 BP: (!) 95/41 mmHg Patient Position (if appropriate): Lying Oxygen Therapy SpO2:  98 % O2 Device: Not Delivered Pain: Pain Assessment Pain Assessment: Faces Faces Pain Scale: No hurt  See FIM for current functional status  Therapy/Group: Individual Therapy  Roney MansSmith, Tyrelle Raczka [facility] 10/26/2014, 4:24 PM

## 2014-10-26 NOTE — Progress Notes (Signed)
[place] PH[facility] MEDICINE & REHABILITATION     PROGRESS NOTE    Subjective/Complaints: Had a fair night. Didn't get on to unit until 11pm!  Objective: Vital Signs: Blood pressure 136/78, pulse 68, temperature 97.6 F (36.4 C), temperature source Oral, resp. rate 18, height 5\' 4"  (1.626 m), weight 49.533 kg (109 lb 3.2 oz), SpO2 100 %. No results found.  Recent Labs  10/25/14 0651  WBC 5.2  HGB 9.3*  HCT 28.7*  PLT 167    Recent Labs  10/23/14 1034 10/25/14 0651  NA 138 133*  K 4.2 4.1  CL 106 101  GLUCOSE 184* 158*  BUN 14 16  CREATININE 0.70 0.60  CALCIUM 8.7 8.7   CBG (last 3)  No results for input(s): GLUCAP in the last 72 hours.  Wt Readings from Last 3 Encounters:  10/26/14 49.533 kg (109 lb 3.2 oz)  10/25/14 51.302 kg (113 lb 1.6 oz)  07/09/14 49.17 kg (108 lb 6.4 oz)    Physical Exam:  Constitutional:  69 year old right handed frail female.  HENT:  Head: Normocephalic.  Eyes: EOM are normal. Pupils are equal, round, and reactive to light.  Negative nystagmus  Neck: Normal range of motion. Neck supple. No tracheal deviation present. No thyromegaly present.  Cardiovascular: Normal rate and regular rhythm.  No murmur heard. Respiratory: Effort normal and breath sounds normal. No respiratory distress. She has no wheezes. She has no rales.  GI: Soft. Bowel sounds are normal. She exhibits no distension. There is no tenderness. There is no rebound.  Musculoskeletal: She exhibits tenderness. She exhibits no edema.  She moves all extremities  Neurological: Coordination abnormal.  Patient is alert flat effect. Follows simple commands. Oriented to name, not place,time,reason. Moves all 4's fairly equally with inconsistent effort. Skin: Skin is warm and dry.  Psychiatric:  Confused, non-agitated  Assessment/Plan: 1. Functional deficits secondary to TBI which require 3+ hours per day of interdisciplinary therapy in a comprehensive inpatient rehab  setting. Physiatrist is providing close team supervision and 24 hour management of active medical problems listed below. Physiatrist and rehab team continue to assess barriers to discharge/monitor patient progress toward functional and medical goals. FIM:                                  Medical Problem List and Plan: 1. Functional deficits secondary to TBI after a fall with left frontal, left temporal bleed and right cerebellar contusion as well as right basal skull fractures 2. DVT Prophylaxis/Anticoagulation: SCDs. Monitor for any signs of DVT 3. Pain Management: Tylenol as needed 4. Mood/bipolar disorder/dementia: Celexa 20 mg daily, Seroquel 300 mg twice a day, Ativan every 4 hours as needed. Discussed baseline with husband 5. Neuropsych: This patient is not capable of making decisions on her own behalf. 6. Skin/Wound Care: Routine skin checks 7. Fluids/Electrolytes/Nutrition: Strict I and O follow-up chemistries 8. Seizure disorder. Keppra 500 mg twice a day. Monitor for any seizure activity 9. Dysphagia. Dysphagia 100 thick liquids. Monitor hydration 10. Hypertension. Lasix 20 mg daily, lisinopril 2.5 mg daily. Monitor with increased mobility 11. Urinary frequency. Enablex 0.5 mg daily, Flomax 0.4 mg daily at bedtime. Check PVR  3 LOS (Days) 1 A FACE TO FACE EVALUATION WAS PERFORMED  Disability Rating Scale  A) Eye Opening: Spontaneous (0)  To Speech (1) To Pain (2) None (3)  Sub-Score:  0  B) Communication Ability: Oriented (0) Confused (1) Inappropriate (2) Incomprehensible (3) None (4)  Sub-Score: 1  C) Motor Response: Obeying (0) Localizing (1) Withdrawing (2) Flexing (3) Extending (4) None (5)  Sub-Score: 0  D) Feeding (Cognitive Ability Only): Complete (0 Partial (1) Minimal (2) None (3)  Sub-Score: 1  E) Toileting (Cognitive Ability Only): Complete (0) Partial  (1) Minimal (2) None (3)  Sub-Score: 2  F) Grooming (Cognitive Ability Only): Complete (0) Partial (1) Minimal (2) None (3)  Sub-Score: 2  G) Level of Functioning (Physical, Mental, Emotional, or Social Function): Completely Independent (0) Independent In A Special Environment (1) Mildly Dependent/Limited Assistance---non-residential assist (2) Moderately Dependent/Moderate Assistance--person in home (3) Markedly Dependent/Assistance With All Major Activities All the Time (4) Totally Dependent/ 24 hour nursing care (5)  Sub-Score: 5  F) Employability (As a Chemical engineer, Futures trader, or Consulting civil engineer): Not Restricted (0) Selected Jobs- Competitive (1) Sheltered Workshop- Non-Competitive (2) Not Employable (3)  Sub-Score: 3   TOTAL SCORE:  14    Zackery Brine T 10/26/2014 8:27 AM

## 2014-10-27 ENCOUNTER — Inpatient Hospital Stay (HOSPITAL_COMMUNITY): Payer: Medicare Other

## 2014-10-27 ENCOUNTER — Inpatient Hospital Stay (HOSPITAL_COMMUNITY): Payer: Medicare Other | Admitting: Occupational Therapy

## 2014-10-27 ENCOUNTER — Inpatient Hospital Stay (HOSPITAL_COMMUNITY): Payer: Medicare Other | Admitting: Speech Pathology

## 2014-10-27 DIAGNOSIS — S06324S Contusion and laceration of left cerebrum with loss of consciousness of 6 hours to 24 hours, sequela: Secondary | ICD-10-CM

## 2014-10-27 MED ORDER — FERROUS SULFATE 325 (65 FE) MG PO TABS
325.0000 mg | ORAL_TABLET | Freq: Two times a day (BID) | ORAL | Status: DC
  Administered 2014-10-27 – 2014-11-16 (×36): 325 mg via ORAL
  Filled 2014-10-27 (×43): qty 1

## 2014-10-27 MED ORDER — PANTOPRAZOLE SODIUM 40 MG PO TBEC
40.0000 mg | DELAYED_RELEASE_TABLET | Freq: Every day | ORAL | Status: DC
  Administered 2014-10-28 – 2014-11-15 (×19): 40 mg via ORAL
  Filled 2014-10-27 (×20): qty 1

## 2014-10-27 NOTE — Progress Notes (Signed)
[place] [facility]ICAL MEDICINE & REHABILITATION     PROGRESS NOTE    Subjective/Complaints: Up and down day yesterday. ?unresponsve episode yesterday---no apparent issues afterward other than ongoing confusion. Appears to have slept last night  Objective: Vital Signs: Blood pressure 131/75, pulse 67, temperature 99 F (37.2 C), temperature source Oral, resp. rate 17, height 5\' 4"  (1.626 m), weight 48.988 kg (108 lb), SpO2 99 %. No results found.  Recent Labs  10/25/14 0651 10/26/14 0900  WBC 5.2 6.0  HGB 9.3* 10.5*  HCT 28.7* 30.6*  PLT 167 191    Recent Labs  10/25/14 0651 10/26/14 0900  NA 133* 134*  K 4.1 4.6  CL 101 101  GLUCOSE 158* 147*  BUN 16 21  CREATININE 0.60 0.69  CALCIUM 8.7 8.8   CBG (last 3)  No results for input(s): GLUCAP in the last 72 hours.  Wt Readings from Last 3 Encounters:  10/27/14 48.988 kg (108 lb)  10/25/14 51.302 kg (113 lb 1.6 oz)  07/09/14 49.17 kg (108 lb 6.4 oz)    Physical Exam:  Constitutional:  69 year old right handed frail female.  HENT:  Head: Normocephalic.  Eyes: EOM are normal. Pupils are equal, round, and reactive to light.     Neck: Normal range of motion. Neck supple. No tracheal deviation present. No thyromegaly present.  Cardiovascular: Normal rate and regular rhythm.  No murmur heard. Respiratory: Effort normal and breath sounds normal. No respiratory distress. She has no wheezes. She has no rales.  GI: Soft. Bowel sounds are normal. She exhibits no distension. There is no tenderness. There is no rebound.  Musculoskeletal: She exhibits tenderness. She exhibits no edema.  She moves all extremities  Neurological: Coordination abnormal.  Patient is alert flat effect. Follows simple commands. Oriented to name, not place,time,reason. Moves all 4's fairly equally with inconsistent effort. Skin: Skin is warm and dry.  Psychiatric:  Remains confused, non-agitated  Assessment/Plan: 1. Functional deficits  secondary to TBI which require 3+ hours per day of interdisciplinary therapy in a comprehensive inpatient rehab setting. Physiatrist is providing close team supervision and 24 hour management of active medical problems listed below. Physiatrist and rehab team continue to assess barriers to discharge/monitor patient progress toward functional and medical goals. FIM: FIM - Bathing Bathing Steps Patient Completed: Chest, Right Arm, Left Arm, Abdomen, Right upper leg, Left upper leg Bathing: 3: Mod-Patient completes 5-7 5090f 10 parts or 50-74%  FIM - Upper Body Dressing/Undressing Upper body dressing/undressing: 0: Wears gown/pajamas-no public clothing FIM - Lower Body Dressing/Undressing Lower body dressing/undressing steps patient completed: Pull pants up/down, Thread/unthread left pants leg Lower body dressing/undressing: 2: Max-Patient completed 25-49% of tasks  FIM - Toileting Toileting: 1: Two helpers  FIM - Diplomatic Services operational officerToilet Transfers Toilet Transfers Assistive Devices: Grab bars Toilet Transfers: 4-To toilet/BSC: Min A (steadying Pt. > 75%), 4-From toilet/BSC: Min A (steadying Pt. > 75%)  FIM - Bed/Chair Transfer Bed/Chair Transfer Assistive Devices: Arm rests, HOB elevated Bed/Chair Transfer: 5: Sit > Supine: Supervision (verbal cues/safety issues), 4: Supine > Sit: Min A (steadying Pt. > 75%/lift 1 leg), 1: Two helpers  FIM - Locomotion: Wheelchair Locomotion: Wheelchair: 1: Total Assistance/staff pushes wheelchair (Pt<25%) FIM - Locomotion: Ambulation Locomotion: Ambulation Assistive Devices: Other (comment) (3 musketeers) Ambulation/Gait Assistance: 1: +2 Total assist Locomotion: Ambulation: 1: Two helpers  Comprehension Comprehension Mode: Auditory Comprehension: 3-Understands basic 50 - 74% of the time/requires cueing 25 - 50%  of the time  Expression Expression Mode: Verbal Expression: 3-Expresses basic 50 -  74% of the time/requires cueing 25 - 50% of the time. Needs to repeat  parts of sentences.  Social Interaction Social Interaction: 2-Interacts appropriately 25 - 49% of time - Needs frequent redirection.  Problem Solving Problem Solving: 1-Solves basic less than 25% of the time - needs direction nearly all the time or does not effectively solve problems and may need a restraint for safety  Memory Memory: 1-Recognizes or recalls less than 25% of the time/requires cueing greater than 75% of the time  Medical Problem List and Plan: 1. Functional deficits secondary to TBI after a fall with left frontal, left temporal bleed and right cerebellar contusion as well as right basal skull fractures 2. DVT Prophylaxis/Anticoagulation: SCDs. Monitor for any signs of DVT 3. Pain Management: Tylenol as needed 4. Mood/bipolar disorder/dementia: Celexa 20 mg daily, Seroquel 300 mg twice a day, Ativan every 4 hours as needed. Fairly substantial dementia at baseline  -observe for mood swings or alterations in arousal 5. Neuropsych: This patient is not capable of making decisions on her own behalf. 6. Skin/Wound Care: Routine skin checks 7. Fluids/Electrolytes/Nutrition: encouarge PO, RD follow up. Lab checks 8. Seizure disorder. Keppra 500 mg twice a day. Monitor for any seizure activity 9. Dysphagia. Dysphagia 100 thick liquids. Monitor hydration 10. Hypertension. Lasix 20 mg daily, lisinopril 2.5 mg daily. Monitor with increased mobility 11. Urinary frequency. Enablex 0.5 mg daily, Flomax 0.4 mg daily at bedtime. Check PVR  3 LOS (Days) 2 A FACE TO FACE EVALUATION WAS PERFORMED                                                               Kern Gingras T 10/27/2014 9:01 AM

## 2014-10-27 NOTE — Progress Notes (Signed)
Occupational Therapy Session Note  Patient Details  Name: Sharon [name] MRN: [id number] Date of Birth: 13-Jun-1946  Today's Date: 10/27/2014 OT Individual Time: 0800-0900 OT Individual Time Calculation (min): 60 min    Short Term Goals: Week 1:  OT Short Term Goal 1 (Week 1): Pt will consistently be oriented x3 with max cues OT Short Term Goal 2 (Week 1): Pt will sustain attention to functional task for 30 seconds with max cues OT Short Term Goal 3 (Week 1): Pt will stand for 30 seconds during functional activity with mod assist OT Short Term Goal 4 (Week 1): Pt will initiate and complete bathing with mod cues and min assist OT Short Term Goal 5 (Week 1): Pt will utilize LUE 50% of time during self-care task with min cues  Skilled Therapeutic Interventions/Progress Updates:    Pt resting in w/c upon arrival.  Pt engaged in BADL retraining including bathing and dressing with sit<>stand from w/c at sink.  Pt required max verbal cues to initiate tasks and attend to bathing and dressing tasks.  Pt required mod A for standing balance when standing at sink, exhibiting posterior lean.  Pt initiated use of LUE throughout session. Pt transitioned to therapy room and day room to engaged in sorting tasks and task initiation.  Pt required 15 mins to sort deck of card by color with max verbal cues for redirection.  Pt returned to nurses station with QRB in place.    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

## 2014-10-27 NOTE — Progress Notes (Signed)
Speech Language Pathology Daily Session Note  Patient Details  Name: Sharon [name] L [place] MRN: [id number] Date of Birth: Apr 17, 1946  Today's Date: 10/27/2014 SLP Individual Time: 1300-1400 SLP Individual Time Calculation (min): 60 min  Short Term Goals: Week 1: SLP Short Term Goal 1 (Week 1): Patient will orient to place, time and situation with use of external aids with Mod A question and verbal cues.  SLP Short Term Goal 2 (Week 1): Patient will demonstrate functional problem solving for basic and familiar self-care tasks with Max A verbal cues.  SLP Short Term Goal 3 (Week 1): Patient will sustain attention to a functional task for 5 minutes with Mod A verbal cues for redirection.  SLP Short Term Goal 4 (Week 1): Patient will consume current diet with minimal overt s/s of aspiration with Mod A verbal cues for use of swallowing compensatory strategies.  SLP Short Term Goal 5 (Week 1): Patient will utilize speech intelligibility strategies with Mod A verbal cues with 75% of observable opportunities to increase intelligibility to 75% at the phrase level.   Skilled Therapeutic Interventions: Skilled therapy session focused on cognitive goals. Patient received from nurse's station upright in w/c, awake and alert. Student facilitated session with Max A verbal, visual, and tactile cues for patient initiation of familiar, functional tasks and Max-Total A verbal, visual, and tactile cues for focused attention for ~20 seconds in a quiet environment. Patient with verbal responses limited to one word in ~50% of opportunities and required Max A verbal cues for orientation to place. Patient left upright in w/c at nurse's station. Continue with current plan of care.   FIM:  Comprehension Comprehension Mode: Auditory Comprehension: 2-Understands basic 25 - 49% of the time/requires cueing 51 - 75% of the time Expression Expression Mode: Verbal Expression: 1-Expresses basis less than 25% of the time/requires cueing  greater than 75% of the time. Social Interaction Social Interaction: 2-Interacts appropriately 25 - 49% of time - Needs frequent redirection. Problem Solving Problem Solving: 1-Solves basic less than 25% of the time - needs direction nearly all the time or does not effectively solve problems and may need a restraint for safety Memory Memory: 1-Recognizes or recalls less than 25% of the time/requires cueing greater than 75% of the time  Pain Pain Assessment Pain Assessment: No/denies pain  Therapy/Group: Individual Therapy  Tacey RuizKatherine Travarus Trudo 10/27/2014, 4:24 PM

## 2014-10-27 NOTE — Progress Notes (Signed)
Physical Therapy Session Note  Patient Details  Name: Sharon [name] MRN: [id number] Date of Birth: 1946-05-16  Today's Date: 10/27/2014 PT Individual Time: 0900-1000 PT Individual Time Calculation (min): 60 min   Skilled Therapeutic Interventions/Progress Updates:   Session focused on attention, awareness, orientation, command following, and standing balance. Patient received at RN station, oriented to self only. Patient provided mostly one word responses during session and required frequent seated rest breaks due to fatigue. Gait training in controlled environment multiple trials up to 150 ft with min A to min A x 2 for safety, patient demonstrating strong posterior lean. Session focused on facilitating forward weight shifts with sit <> stand transfers, placing objects to target, and completing tasks at tabletop. With min questioning, patient reported need for bathroom. Patient incontinent of bowel movement but continent of urine/bowel movement in toilet, with patient initiating hygiene requiring assist for thoroughness and clothing management with assist to initiate task. Patient unable to sustain attention to task of completing simple pipeline puzzle tree with pieces laid out and diagram to copy but was able to follow one step commands with total A to complete task. With repeated sit <> stand transfers and forward leans, patient demonstrated improvement in standing balance with slightly decreased posterior lean, min guard-min A. Patient negotiated up/down eight 4" steps and four 6" steps with min A overall, total multimodal cues/HOH assist to advance BUE along rails to facilitate forward lean and upright posture. Patient left sitting in wheelchair with quick release belt on at RN station.    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

## 2014-10-27 NOTE — Progress Notes (Signed)
Occupational Therapy Session Note  Patient Details  Name: Sharon [name] MRN: [id number] Date of Birth: Oct 18, 1945  Today's Date: 10/27/2014 OT Individual Time: [id number] OT Individual Time Calculation (min): 15 min  and Today's Date: 10/27/2014 OT Missed Time: 15 Minutes Missed Time Reason: Patient fatigue   Short Term Goals: Week 1:  OT Short Term Goal 1 (Week 1): Pt will consistently be oriented x3 with max cues OT Short Term Goal 2 (Week 1): Pt will sustain attention to functional task for 30 seconds with max cues OT Short Term Goal 3 (Week 1): Pt will stand for 30 seconds during functional activity with mod assist OT Short Term Goal 4 (Week 1): Pt will initiate and complete bathing with mod cues and min assist OT Short Term Goal 5 (Week 1): Pt will utilize LUE 50% of time during self-care task with min cues  Skilled Therapeutic Interventions/Progress Updates:    Pt seen for 1:1 OT session with focus on sit<>stand, command following, sustained attention, and functional transfers. Pt received sitting in w/c. Pt responding to verbal and tactile cues <25% of time and appeared to be very fatigued. Pt stating "yeah" when asked if she would like to return to bed. Provided pajama bottoms and pt initiated sit>stand with min A to complete, then required total A to initiate LB dressing. Pt required total A to don pants, however initiated crossover technique to thread BLEs. Positioned w/c beside bed in preparation for transfer. Pt required min A sit>stand then total A to complete safely as pt attempting to step into bed rather than pivoting to sit. Pt left supine in bed asleep with all needs in reach and sitter aware.   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

## 2014-10-28 ENCOUNTER — Inpatient Hospital Stay (HOSPITAL_COMMUNITY): Payer: Medicare Other | Admitting: Speech Pathology

## 2014-10-28 ENCOUNTER — Inpatient Hospital Stay (HOSPITAL_COMMUNITY): Payer: Medicare Other

## 2014-10-28 ENCOUNTER — Inpatient Hospital Stay (HOSPITAL_COMMUNITY): Payer: Medicare Other | Admitting: Physical Therapy

## 2014-10-28 DIAGNOSIS — F0390 Unspecified dementia without behavioral disturbance: Secondary | ICD-10-CM

## 2014-10-28 NOTE — Progress Notes (Signed)
Occupational Therapy Session Note  Patient Details  Name: Sharon [name] MRN: [id number] Date of Birth: 09-21-1945  Today's Date: 10/28/2014 OT Individual Time: 0800-0900 OT Individual Time Calculation (min): 60 min    Short Term Goals: Week 1:  OT Short Term Goal 1 (Week 1): Pt will consistently be oriented x3 with max cues OT Short Term Goal 2 (Week 1): Pt will sustain attention to functional task for 30 seconds with max cues OT Short Term Goal 3 (Week 1): Pt will stand for 30 seconds during functional activity with mod assist OT Short Term Goal 4 (Week 1): Pt will initiate and complete bathing with mod cues and min assist OT Short Term Goal 5 (Week 1): Pt will utilize LUE 50% of time during self-care task with min cues  Skilled Therapeutic Interventions/Progress Updates:    Pt asleep but easily aroused and greeted therapist appropriately.  Pt oriented to city only upon initial questioning.  Pt was able to state she was in the hospital when offered a choice of 3.  Pt initiated washing face when presented with wash cloth but required max multimodal cues to bathe other parts of body.  Pt requested use of toilet when offered choice and was continent of bowel.  Pt exhibited increased difficulty with orienting bra this morning and required assistance for placing correctly.  Pt required min A for sit<>stand and standing balance at sink for LB bathing and pulling up pants.  Pt transitioned to day room and engaged in sorting tasks with focus on task initiation, following one step commands and attention to task.  Focus on BADL retraining, cognitive remediation, sit<>stand, standing balance, and safety awareness.  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

## 2014-10-28 NOTE — IPOC Note (Signed)
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 Active Problems:   Frontal lobe contusion     Functional Problem List: Nursing Behavior, Bladder, Bowel, Endurance, Motor, Nutrition, Perception, Safety, Sensory, Skin Integrity  PT Balance, Behavior, Endurance, Motor, Nutrition, Perception, Safety, Sensory  OT Balance, Cognition, Safety, Perception, Sensory, Endurance, Motor, Vision  SLP Cognition, Linguistic  TR         Basic ADL's: OT Grooming, Dressing, Bathing, Toileting     Advanced  ADL's: OT       Transfers: PT Bed Mobility, Bed to Chair, Car, Occupational psychologist, Research scientist (life sciences): PT Ambulation, Stairs     Additional Impairments: OT Other (comment), Fuctional Use of Upper Extremity (significant cognitive deficits)  SLP Swallowing, Communication, Social Cognition expression Social Interaction, Problem Solving, Memory, Attention, Awareness  TR      Anticipated Outcomes Item Anticipated Outcome  Self Feeding n/a  Swallowing  Min A for use of swallow strategies   Basic self-care  min-supervision  Toileting  min assist   Bathroom Transfers min assist  Bowel/Bladder  Moderate assist   Transfers  min A  Locomotion  min A household ambulator  Communication  Min A  Cognition  Min A for orientation/awareness; Mod A for problem-solving  Pain  <4 on 0-10 pain scale  Safety/Judgment  Increased safety awareness; no falls during this admission   Therapy Plan: PT Intensity: Minimum of 1-2 x/day ,45 to 90 minutes PT Frequency: 5 out of 7 days PT Duration Estimated Length of Stay: 10-14 days OT Intensity: Minimum of 1-2 x/day, 45 to 90 minutes OT Frequency: 5 out of 7 days OT Duration/Estimated Length of Stay: 10-14 days SLP Intensity: Minumum of 1-2  x/day, 30 to 90 minutes SLP Frequency: 3 to 5 out of 7 days SLP Duration/Estimated Length of Stay: 10-14 days       Team Interventions: Nursing Interventions Patient/Family Education, Bladder Management, Bowel Management, Skin Care/Wound Management, Psychosocial Support, Discharge Planning, Dysphagia/Aspiration Precaution Training  PT interventions Ambulation/gait training, Balance/vestibular training, Cognitive remediation/compensation, Discharge planning, Disease management/prevention, DME/adaptive equipment instruction, Functional mobility training, Neuromuscular re-education, Patient/family education, Psychosocial support, Stair training, Therapeutic Activities, Therapeutic Exercise, UE/LE Strength taining/ROM, UE/LE Coordination activities, Wheelchair propulsion/positioning  OT Interventions Warden/ranger, Cognitive remediation/compensation, Discharge planning, Community reintegration, Fish farm manager, Functional mobility training, Neuromuscular re-education, Patient/family education, Psychosocial support, Self Care/advanced ADL retraining, Therapeutic Activities, Therapeutic Exercise, Splinting/orthotics, UE/LE Strength taining/ROM, Visual/perceptual remediation/compensation, UE/LE Coordination activities, Wheelchair propulsion/positioning  SLP Interventions Cognitive remediation/compensation, Financial trader, Dysphagia/aspiration precaution training, Therapeutic Activities, Speech/Language facilitation, Internal/external aids, Patient/family education, Functional tasks, Environmental controls  TR Interventions    SW/CM Interventions Discharge Planning, Psychosocial Support, Patient/Family Education    Team Discharge Planning: Destination: PT-Home ,OT- Home , SLP-Home Projected Follow-up: PT-24 hour supervision/assistance, Skilled nursing facility, Home health PT (HH vs SNF, unsure of available assist at discharge), OT-  24 hour supervision/assistance, Home  health OT, SLP-Home Health SLP, Skilled Nursing facility, 24 hour supervision/assistance (HH vs. SNF ) Projected Equipment Needs: PT-To be determined, OT- To be determined, SLP-To be determined Equipment Details: PT- , OT-  Patient/family involved in discharge planning: PT- Patient unable/family or caregiver not available,  OT-Patient unable/family or caregiver not available, SLP-Patient unable/family or caregive not available  MD ELOS: 20-30d Medical Rehab Prognosis:  Good Assessment: 69 y.o  right handed. female with history of chronic systolic congestive heart failure, dementia and bipolar disorder followed by Dr. Nolen MuMcKinney of psychiatry services. She lives with her husband and sedentary prior to admission. She was able to dress herself as well as feed herself but no meal preparation. Presented 10/14/2014 after a fall with left frontal, L temporal bleed &R cerebellar contusion, R basal skull fxs, and post traumatic seizures. Neurosurgery Dr. Jeral FruitBotero consulted with conservative care. Echocardiogram with ejection fraction of 45% no wall motion abnormalities. Hospital course also significant for UTI with antibiotics completed. Swallow study completed maintain on a dysphagia 1 honey thick liquid diet. She continues on Keppra 500 mg twice daily.    Now requiring 24/7 Rehab RN,MD, as well as CIR level PT, OT and SLP.  Treatment team will focus on ADLs and mobility [facility]  See Team Conference Notes for weekly updates to the plan of care

## 2014-10-28 NOTE — Progress Notes (Signed)
Physical Therapy Session Note  Patient Details  Name: Sharon [name] MRN: [id number] Date of Birth: 19-Feb-1946  Today's Date: 10/28/2014 PT Individual Time: 0900-0930 PT Individual Time Calculation (min): 30 min   Short Term Goals: Week 1:     Skilled Therapeutic Interventions/Progress Updates:  Patient seated in wheelchair at nurses station. Patient reports having a headache. RN notified of patient's need of pain medication. Patient pushed to gym. Session focused on attention, orientation, sit to stand, and ambulation. Patient performed multiple sit <> stand movements with min steady assist for safety. Patient stood and attended to clothes pin task 45 seconds to 1 minute multiple times. Patient oriented to self and city; responses to other orientation questions were[facility] "Friday", "1944."  Patient ambulated 150 feet with minimal HHA and slight posterior lean. Patient requested to lie down due to headache. Patient transferred wheelchair to bed with min assist and was left in bed with bed alarm on, 3 side rails up, and all items in reach.   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), sits without warning Restrictions Weight Bearing Restrictions: No  Pain: Pain Assessment Pain Assessment: Faces Faces Pain Scale: Hurts whole lot Pain Type: Acute pain Pain Location: Head Pain Descriptors / Indicators: Headache Pain Frequency: Intermittent Pain Onset: Gradual Patients Stated Pain Goal: 0 Pain Intervention(s): Medication (See eMAR)  Locomotion : Ambulation Ambulation/Gait Assistance: 4: Min assist   See FIM for current functional status  Therapy/Group: Individual Therapy  [name], Sawyer Mentzer M 10/28/2014, 9:40 AM

## 2014-10-28 NOTE — Progress Notes (Signed)
Physical Therapy Session Note  Patient Details  Name: Sharon [name] MRN: [id number] Date of Birth: 09-20-1945  Today's Date: 10/28/2014 PT Individual Time: 1455-1550 PT Individual Time Calculation (min): 55 min   Skilled Therapeutic Interventions/Progress Updates:   Session focused on attention, orientation, cognitive remediation, standing balance, and patient/family education. Patient received sitting in wheelchair, husband present for session. Patient's husband reported that patient was supervision level at baseline, with progressive functional decline in last year including BLE buckling with resultant falls and worsening dementia, inconsistently oriented to place and time. Patient oriented to birth day and month (not year) and month. Patient attempted to stand from wheelchair, with questioning she reported need for bathroom. Ambulated to bathroom with min A for toilet transfer using grab bar, patient incontinent of bowel, required total A for clothing management and hygiene. Gait training throughout rehab unit in controlled setting with min A, posterior trunk lean in standing. Patient engaged in simple sorting task with focus on forward lean, task initiation, following one step commands and attention to task with max multimodal assist. Patient able to follow one step commands for organizing task and ball toss to husband with max cues. Patient continually attempting to lie down to sleep on mat table due to fatigue, requiring max cues to re-engage in task and mod A for supine > sit on mat table. Patient returned to room and left supine in bed with bed alarm on, 3 rails up, and husband in room.    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), sits without warning Restrictions Weight Bearing Restrictions: No Pain: Pain Assessment Pain Assessment: Faces Faces Pain Scale: No hurt Locomotion  : Ambulation Ambulation/Gait Assistance: 4: Min assist   See FIM for current functional status  Therapy/Group: Individual Therapy  Kerney ElbeVarner, Hannah Strader A 10/28/2014, 3:57 PM

## 2014-10-28 NOTE — Progress Notes (Signed)
[facility] PHYSICAL MEDICINE & REHABILITATION     PROGRESS NOTE    Subjective/Complaints: Had a reasonable night. Reports sl headache. Otherwise comfortable.   Objective: Vital Signs: Blood pressure 142/98, pulse 70, temperature 98.4 F (36.9 C), temperature source Oral, resp. rate 16, height 5\' 4"  (1.626 m), weight 46 kg (101 lb 6.6 oz), SpO2 91 %. No results found.  Recent Labs  10/26/14 0900  WBC 6.0  HGB 10.5*  HCT 30.6*  PLT 191    Recent Labs  10/26/14 0900  NA 134*  K 4.6  CL 101  GLUCOSE 147*  BUN 21  CREATININE 0.69  CALCIUM 8.8   CBG (last 3)  No results for input(s): GLUCAP in the last 72 hours.  Wt Readings from Last 3 Encounters:  10/28/14 46 kg (101 lb 6.6 oz)  10/25/14 51.302 kg (113 lb 1.6 oz)  07/09/14 49.17 kg (108 lb 6.4 oz)    Physical Exam:  Constitutional:  69 year old right handed frail female.  HENT:  Head: Normocephalic.  Eyes: EOM are normal. Pupils are equal, round, and reactive to light.     Neck: Normal range of motion. Neck supple. No tracheal deviation present. No thyromegaly present.  Cardiovascular: Normal rate and regular rhythm.  No murmur heard. Respiratory: Effort normal and breath sounds normal. No respiratory distress. She has no wheezes. She has no rales.  GI: Soft. Bowel sounds are normal. She exhibits no distension. There is no tenderness. There is no rebound.  Musculoskeletal: She exhibits tenderness. She exhibits no edema.  She moves all extremities  Neurological: Coordination abnormal.  Patient is alert flat effect. Follows simple commands. Oriented to name, not place,time,reason. Moves all 4's fairly equally with inconsistent effort. Skin: Skin is warm and dry.  Psychiatric:  Remains confused, non-agitated  Assessment/Plan: 1. Functional deficits secondary to TBI which require 3+ hours per day of interdisciplinary therapy in a comprehensive inpatient rehab setting. Physiatrist is providing close team  supervision and 24 hour management of active medical problems listed below. Physiatrist and rehab team continue to assess barriers to discharge/monitor patient progress toward functional and medical goals.  FIM: FIM - Bathing Bathing Steps Patient Completed: Chest, Right Arm, Left Arm, Abdomen, Front perineal area, Buttocks, Right upper leg, Left upper leg Bathing: 4: Min-Patient completes 8-9 [id number] 10 parts or 75+ percent  FIM - Upper Body Dressing/Undressing Upper body dressing/undressing steps patient completed: Thread/unthread right bra strap, Thread/unthread left bra strap, Thread/unthread right sleeve of pullover shirt/dresss, Thread/unthread left sleeve of pullover shirt/dress, Put head through opening of pull over shirt/dress, Pull shirt over trunk Upper body dressing/undressing: 4: Min-Patient completed 75 plus % of tasks FIM - Lower Body Dressing/Undressing Lower body dressing/undressing steps patient completed: Thread/unthread right underwear leg, Thread/unthread left underwear leg, Pull underwear up/down, Pull pants up/down, Don/Doff right sock, Don/Doff left sock Lower body dressing/undressing: 3: Mod-Patient completed 50-74% of tasks  FIM - Toileting Toileting: 1: Two helpers  FIM - Diplomatic Services operational officerToilet Transfers Toilet Transfers Assistive Devices: Grab bars Toilet Transfers: 4-To toilet/BSC: Min A (steadying Pt. > 75%), 4-From toilet/BSC: Min A (steadying Pt. > 75%)  FIM - Bed/Chair Transfer Bed/Chair Transfer Assistive Devices: Arm rests Bed/Chair Transfer: 4: Sit > Supine: Min A (steadying pt. > 75%/lift 1 leg), 4: Chair or W/C > Bed: Min A (steadying Pt. > 75%)  FIM - Locomotion: Wheelchair Locomotion: Wheelchair: 1: Total Assistance/staff pushes wheelchair (Pt<25%) FIM - Locomotion: Ambulation Locomotion: Ambulation Assistive Devices: Other (comment) (HHA) Ambulation/Gait Assistance: 4: Min assist Locomotion: Ambulation:  4: Travels 150 ft or more with minimal assistance  (Pt.>75%)  Comprehension Comprehension Mode: Auditory Comprehension: 2-Understands basic 25 - 49% of the time/requires cueing 51 - 75% of the time  Expression Expression Mode: Verbal Expression: 2-Expresses basic 25 - 49% of the time/requires cueing 50 - 75% of the time. Uses single words/gestures.  Social Interaction Social Interaction: 2-Interacts appropriately 25 - 49% of time - Needs frequent redirection.  Problem Solving Problem Solving: 1-Solves basic less than 25% of the time - needs direction nearly all the time or does not effectively solve problems and may need a restraint for safety  Memory Memory: 1-Recognizes or recalls less than 25% of the time/requires cueing greater than 75% of the time  Medical Problem List and Plan: 1. Functional deficits secondary to TBI after a fall with left frontal, left temporal bleed and right cerebellar contusion as well as right basal skull fractures 2. DVT Prophylaxis/Anticoagulation: SCDs. Monitor for any signs of DVT 3. Pain Management: Tylenol as needed 4. Mood/bipolar disorder/dementia: Celexa 20 mg daily, Seroquel 300 mg twice a day, Ativan every 4 hours as needed. Fairly substantial dementia at baseline  -observe for mood swings or alterations in arousal 5. Neuropsych: This patient is not capable of making decisions on her own behalf. 6. Skin/Wound Care: Routine skin checks 7. Fluids/Electrolytes/Nutrition: encouarge PO, RD follow up.  8. Seizure disorder. Keppra 500 mg twice a day. Monitor for any seizure activity 9. Dysphagia. Dysphagia 100 thick liquids. Monitor hydration 10. Hypertension. Lasix 20 mg daily, lisinopril 2.5 mg daily. Monitor with increased mobility 11. Urinary frequency. Enablex 0.5 mg daily, Flomax 0.4 mg daily at bedtime. Check PVR  3 LOS (Days) 3 A FACE TO FACE EVALUATION WAS PERFORMED                                                               Maricel Swartzendruber T 10/28/2014 9:45 AM

## 2014-10-28 NOTE — Progress Notes (Signed)
Speech Language Pathology Daily Session Note  Patient Details  Name: Sharon [name] MRN: [id number] Date of Birth: 08-Aug-1945  Today's Date: 10/28/2014 SLP Individual Time: 1030-1120 SLP Individual Time Calculation (min): 50 min and Today's Date: 10/28/2014 SLP Missed Time: 10 Minutes Missed Time Reason: Patient fatigue  Short Term Goals: Week 1: SLP Short Term Goal 1 (Week 1): Patient will orient to place, time and situation with use of external aids with Mod A question and verbal cues.  SLP Short Term Goal 2 (Week 1): Patient will demonstrate functional problem solving for basic and familiar self-care tasks with Max A verbal cues.  SLP Short Term Goal 3 (Week 1): Patient will sustain attention to a functional task for 5 minutes with Mod A verbal cues for redirection.  SLP Short Term Goal 4 (Week 1): Patient will consume current diet with minimal overt s/s of aspiration with Mod A verbal cues for use of swallowing compensatory strategies.  SLP Short Term Goal 5 (Week 1): Patient will utilize speech intelligibility strategies with Mod A verbal cues with 75% of observable opportunities to increase intelligibility to 75% at the phrase level.   Skilled Therapeutic Interventions: Skilled therapy session focused on patient arousal for engagement in therapeutic activities and subsequent cognitive goals. Upon arrival patient was supine in bed, asleep, and required Max-Total A tactile and verbal cues for arousal. Patient transferred to wheelchair with PT as assist to facilitate arousal and attention and patient's BP assessed: 99/47. Student facilitated session with Max-Total encouragement and cues for patient arousal. Efforts, including face washing, sternal rub, tactile cues, and standing with PT as assist, were minimally successful and patient required Max A verbal and tactile cues for focused attention for ~2 seconds in ~50% of opportunities. Patient initiated and completed basic self-care task x2 with  Max-Total verbal, visual, and tactile cues, and required Max-Total A verbal cues for limited (1-2 words) verbal utterances in 25-50% of opportunities. Patient missed 10 minutes of skilled SLP intervention due to inability to participate due to lethargy and fatigue; patient left upright in w/c with quick-release belt at nurse's station. Continue with current plan of care.   FIM:  Comprehension Comprehension Mode: Auditory Comprehension: 1-Understands basic less than 25% of the time/requires cueing 75% of the time Expression Expression Mode: Verbal Expression: 1-Expresses basis less than 25% of the time/requires cueing greater than 75% of the time. Social Interaction Social Interaction: 1-Interacts appropriately less than 25% of the time. May be withdrawn or combative. Problem Solving Problem Solving: 1-Solves basic less than 25% of the time - needs direction nearly all the time or does not effectively solve problems and may need a restraint for safety Memory Memory: 1-Recognizes or recalls less than 25% of the time/requires cueing greater than 75% of the time  Pain Pain Assessment Pain Assessment: No/denies pain  Therapy/Group: Individual Therapy  Tacey RuizKatherine Addis Tuohy 10/28/2014, 11:29 AM

## 2014-10-29 ENCOUNTER — Ambulatory Visit (HOSPITAL_COMMUNITY): Payer: Medicare Other | Admitting: Speech Pathology

## 2014-10-29 ENCOUNTER — Inpatient Hospital Stay (HOSPITAL_COMMUNITY): Payer: Medicare Other | Admitting: Occupational Therapy

## 2014-10-29 ENCOUNTER — Inpatient Hospital Stay (HOSPITAL_COMMUNITY): Payer: Medicare Other

## 2014-10-29 DIAGNOSIS — R339 Retention of urine, unspecified: Secondary | ICD-10-CM

## 2014-10-29 LAB — URINALYSIS, ROUTINE W REFLEX MICROSCOPIC
Bilirubin Urine: NEGATIVE
Glucose, UA: NEGATIVE mg/dL
Hgb urine dipstick: NEGATIVE
Ketones, ur: NEGATIVE mg/dL
Leukocytes, UA: NEGATIVE
NITRITE: NEGATIVE
PH: 7 (ref 5.0–8.0)
Protein, ur: NEGATIVE mg/dL
Specific Gravity, Urine: 1.016 (ref 1.005–1.030)
UROBILINOGEN UA: 1 mg/dL (ref 0.0–1.0)

## 2014-10-29 NOTE — Progress Notes (Signed)
1045 am patient had unresponsive episode while sitting at nursing station. Patient had was just witnessed as being alert and awake a couple minutes prior by this RN while waiting on therapy to arrive to pick up patient for scheduled session. Patient would not respond to loud claps or yelling of her first name. Patient taken back to her room by therapy and RN. BP taken at 1103 am 53/37 in RUE while sitting, again at 1105 am 67/47 in LUE while sitting. Patient was returned to bed and placed in a slight trendelenburg position and BP taken again at 1110 am 90/40 in LUE. Sternal rub performed and patient began coming around. Pulse remained in the 70's and O2 sats remained 100% on room air. Deatra Inaan Angiulli, PA-C notified and came to check on patient. Patient more alert to sternal rub and rubbing of bare foot. No new orders received at this time. Patient cleared to continued therapy when alert and awake.

## 2014-10-29 NOTE — Progress Notes (Signed)
Occupational Therapy Session Note  Patient Details  Name: Sharon [name] MRN: [id number] Date of Birth: 1945-08-07  Today's Date: 10/29/2014 OT Individual Time: 0900-1000 and [id number] OT Individual Time Calculation (min): 60 min and 12 min     Short Term Goals: Week 1:  OT Short Term Goal 1 (Week 1): Pt will consistently be oriented x3 with max cues OT Short Term Goal 2 (Week 1): Pt will sustain attention to functional task for 30 seconds with max cues OT Short Term Goal 3 (Week 1): Pt will stand for 30 seconds during functional activity with mod assist OT Short Term Goal 4 (Week 1): Pt will initiate and complete bathing with mod cues and min assist OT Short Term Goal 5 (Week 1): Pt will utilize LUE 50% of time during self-care task with min cues  Skilled Therapeutic Interventions/Progress Updates:    Session 1: Pt seen for ADL retraining with focus on functional transfers, standing balance, command following, task initiation, sequencing, and sustained attention. Pt received supine in bed disoriented to place and time of day. Pt verbalized "falling" however when asked what she injured she reported "the ground." Completed supine>sit and stand pivot transfer bed>w/c at min A. Pt initiating need to toilet however once on toilet, no void noted. Engaged in bathing at sink with pt requiring max multimodal cues for task completion and min A overall. Pt perseverative on needing to go back to bed and attempting to stand to ambulate towards bed multiple times. Pt verbalized 75% of clothing items she needed for the day with mod cues. Engaged in sorting task with silverware with 90% accuracy and pt sustaining attention for 20 seconds without cues. Engaged in flower watering task from w/c level with max cues for command following and sustained attention. Pt assisted with wiping tables in day room from w/c level, sustaining attention for 30 seconds with min cues. At end of session pt left with RN to provide  medication.   Session 2: Pt seen for 1:1 OT session with focus on orientation, sustained attention, command following, and activity tolerance. Pt received sitting at nurse's station appropriately responding to therapist's greeting. Transferred to room in hopes to transfer to w/c however pt immediately closing eyes and leaning over side of recliner. Pt aroused with max multimodal cues. Pt answered orientation questions with delayed response. Pt verbalized being at "[place]" and "falling." Pt closing eyes and unable to arouse enough to participate in functional activity. Positioned recliner beside bed and pt initiated sit>stand with min A then required max A for pivoting to bed due to fatigue. Pt transitioned to supine and left with all needs in place. Pt missing 18 min skilled OT secondary to fatigue, RN notified.   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), sits without warning Restrictions Weight Bearing Restrictions: No General:   Vital Signs: Therapy Vitals Pulse Rate: 74 BP: (!) 90/40 mmHg Patient Position (if appropriate): Lying Oxygen Therapy SpO2: 100 % O2 Device: Not Delivered Pain:  No report of pain  See FIM for current functional status  Therapy/Group: Individual Therapy  Daneil Danerkinson, Markus Casten N 10/29/2014, 12:21 PM

## 2014-10-29 NOTE — Progress Notes (Signed)
[place] [facility] MEDICINE & REHABILITATION     PROGRESS NOTE    Subjective/Complaints: Sitting in bed pleasantly confused. Denies pain,sob,c/n/v/d/cp  Objective: Vital Signs: Blood pressure 142/68, pulse 61, temperature 97.8 F (36.6 C), temperature source Oral, resp. rate 16, height  (1.626 m), weight 47.6 kg (104 lb 15 oz), SpO2 99 %. No results found.  Recent Labs  10/26/14 0900  WBC 6.0  HGB 10.5*  HCT 30.6*  PLT 191    Recent Labs  10/26/14 0900  NA 134*  K 4.6  CL 101  GLUCOSE 147*  BUN 21  CREATININE 0.69  CALCIUM 8.8   CBG (last 3)  No results for input(s): GLUCAP in the last 72 hours.  Wt Readings from Last 3 Encounters:  10/29/14 47.6 kg (104 lb 15 oz)  10/25/14 51.302 kg (113 lb 1.6 oz)  07/09/14 49.17 kg (108 lb 6.4 oz)    Physical Exam:  Constitutional:  69 year old right handed frail female.  HENT:  Head: Normocephalic.  Eyes: EOM are normal. Pupils are equal, round, and reactive to light.     Neck: Normal range of motion. Neck supple. No tracheal deviation present. No thyromegaly present.  Cardiovascular: Normal rate and regular rhythm.  No murmur heard. Respiratory: Effort normal and breath sounds normal. No respiratory distress. She has no wheezes. She has no rales.  GI: Soft. Bowel sounds are normal. She exhibits no distension. There is no tenderness. There is no rebound.  Musculoskeletal: She exhibits tenderness. She exhibits no edema.  She moves all extremities  Neurological: Coordination abnormal.  Patient is alert flat effect. Follows simple commands. Oriented to name and hospital---not time,reason. Moves all 4's fairly equally with inconsistent effort. Skin: Skin is warm and dry.  Psychiatric:  Remains confused, non-agitated  Assessment/Plan: 1. Functional deficits secondary to TBI which require 3+ hours per day of interdisciplinary therapy in a comprehensive inpatient rehab setting. Physiatrist is providing close team  supervision and 24 hour management of active medical problems listed below. Physiatrist and rehab team continue to assess barriers to discharge/monitor patient progress toward functional and medical goals.  FIM: FIM - Bathing Bathing Steps Patient Completed: Chest, Right Arm, Left Arm, Abdomen, Front perineal area, Right upper leg, Left upper leg Bathing: 3: Mod-Patient completes 5-7 42f 10 parts or 50-74%  FIM - Upper Body Dressing/Undressing Upper body dressing/undressing steps patient completed: Thread/unthread right bra strap, Thread/unthread left bra strap, Thread/unthread right sleeve of pullover shirt/dresss, Put head through opening of pull over shirt/dress, Thread/unthread left sleeve of pullover shirt/dress, Pull shirt over trunk Upper body dressing/undressing: 4: Min-Patient completed 75 plus % of tasks FIM - Lower Body Dressing/Undressing Lower body dressing/undressing steps patient completed: Thread/unthread right underwear leg, Thread/unthread left underwear leg, Pull underwear up/down, Pull pants up/down, Don/Doff left sock, Don/Doff right sock Lower body dressing/undressing: 3: Mod-Patient completed 50-74% of tasks  FIM - Toileting Toileting steps completed by patient: Adjust clothing prior to toileting, Adjust clothing after toileting Toileting: 3: Mod-Patient completed 2 of 3 steps  FIM - Diplomatic Services operational officer Devices: Grab bars Toilet Transfers: 4-To toilet/BSC: Min A (steadying Pt. > 75%), 4-From toilet/BSC: Min A (steadying Pt. > 75%)  FIM - Bed/Chair Transfer Bed/Chair Transfer Assistive Devices: Arm rests Bed/Chair Transfer: 4: Bed > Chair or W/C: Min A (steadying Pt. > 75%), 4: Chair or W/C > Bed: Min A (steadying Pt. > 75%), 5: Sit > Supine: Supervision (verbal cues/safety issues)  FIM - Locomotion: Wheelchair Locomotion: Wheelchair: 0: Activity  did not occur FIM - Locomotion: Ambulation Locomotion: Ambulation Assistive Devices: Other  (comment) (HHA, none) Ambulation/Gait Assistance: 4: Min assist Locomotion: Ambulation: 4: Travels 150 ft or more with minimal assistance (Pt.>75%)  Comprehension Comprehension Mode: Auditory Comprehension: 1-Understands basic less than 25% of the time/requires cueing 75% of the time  Expression Expression Mode: Verbal Expression: 1-Expresses basis less than 25% of the time/requires cueing greater than 75% of the time.  Social Interaction Social Interaction: 2-Interacts appropriately 25 - 49% of time - Needs frequent redirection.  Problem Solving Problem Solving: 1-Solves basic less than 25% of the time - needs direction nearly all the time or does not effectively solve problems and may need a restraint for safety  Memory Memory: 1-Recognizes or recalls less than 25% of the time/requires cueing greater than 75% of the time  Medical Problem List and Plan: 1. Functional deficits secondary to TBI after a fall with left frontal, left temporal bleed and right cerebellar contusion as well as right basal skull fractures 2. DVT Prophylaxis/Anticoagulation: SCDs. Monitor for any signs of DVT 3. Pain Management: Tylenol as needed 4. Mood/bipolar disorder/dementia: Celexa 20 mg daily, Seroquel 300 mg twice a day, Ativan every 4 hours as needed. Fairly substantial dementia at baseline  -observe for mood swings or alterations in arousal--have not seen that so far here 5. Neuropsych: This patient is not capable of making decisions on her own behalf. 6. Skin/Wound Care: Routine skin checks 7. Fluids/Electrolytes/Nutrition: encouarge PO, RD follow up as well 8. Seizure disorder. Keppra 500 mg twice a day. Monitor for any seizure activity 9. Dysphagia. Dysphagia 100 thick liquids. Monitor hydration 10. Hypertension. Lasix 20 mg daily, lisinopril 2.5 mg daily. Monitor with increased mobility  -recheck labs monday 11. Urinary frequency. Having large PVR's. DC enablex , continue Flomax 0.4 mg daily at  bedtime. Continue check PVR's and cath as needed for volumes greater than 350cc  -check ua,cx   LOS (Days) 4 A FACE TO FACE EVALUATION WAS PERFORMED                                                               Azaria Stegman T 10/29/2014 8:24 AM

## 2014-10-29 NOTE — Progress Notes (Addendum)
Physical Therapy Session Note  Patient Details  Name: Sharon [name] MRN: [id number] Date of Birth: [birth year]  Today's Date: 10/29/2014 PT Individual Time: 1345-1500 PT Individual Time Calculation (min): 75 min   Short Term Goals: Week 1:  PT Short Term Goal 1 (Week 1): Patient will perform bed mobility with supervision 50% of time.  PT Short Term Goal 2 (Week 1): Patient will transfer bed <> wheelchair with min A 75% of time.  PT Short Term Goal 3 (Week 1): Patient will demonstrate dynamic standing balance x 5 min with consistent min A. PT Short Term Goal 4 (Week 1): Patient will demonstrate sustained attention to functional task x 60 sec with max cues.  PT Short Term Goal 5 (Week 1): Patient will demonstrate functional problem solving for basic mobility task with max cues.   Skilled Therapeutic Interventions/Progress Updates:   Session focused on focused > sustained attention, command following, orientation, standing balance, and activity tolerance. Patient received asleep in bed, max A for supine > sit and min-mod A x 1 to ambulate to toilet, patient unable to void and total A for clothing management. Seated BP 140/57. Patient ambulated to therapy gym with min HHA x 2, decreased posterior lean noted initially compared to yesterday although variable throughout session requiring min-mod A x 1 to +2 A for safety. Patient initially demonstrated focused attention only and [facility] with more than reasonable time to don clean pillowcases on pillows and water two plants in day room. Patient continually attempting to lie down on mat/chair to sleep although demonstrating increased restlessness throughout session. During ambulation, patient able to follow basic one step familiar directional commands 100% of time. Patient with improved initiation for sit <> stand transfers but ambulated without purpose from seat to seat on rehab unit. Patient able to sustain attention to eating magic cup with total  cues for approx 10 bites. During seated rest halfway through session, patient became more alert and oriented to self, place [facility] situation (fall), home address and home phone number with min cues. Patient able to follow commands 50% of time with max multimodal cues to retrieve honey-thick apple juice from refrigerator and throw away cup after drinking. Patient verbalized yes with questioning to use bathroom. Patient unable to void but managed clothing and performed toilet transfer with min steadying assist. Patient left sitting in recliner with quick release belt on at RN station.   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

## 2014-10-29 NOTE — Progress Notes (Signed)
SLP Cancellation Note  Patient Details Name: Elenora GammaJoan L Donaghue MRN: [id number] DOB: 09/17/1945   Cancelled treatment:       Patient missed 60 minutes of skilled SLP intervention due to fatigue. Upon arrival, patient was sititing upright in wheelchair but was unresponsive with anterior spillage of saliva, RN made aware. BP taken and was 53/37 in the right arm and 67/46 in the left arm, O2 and HR WFL. Patient transferred back to bed and placed in trendelenburg and BP retaken and was 90/40 in the left arm. PA made aware, patient was responsive to noxious stimuli but was nonverbal and maintained her eyes open for ~2 seconds, therefore, Patient unable to participate in treatment session due to fatigue and lethargy.                                                                                                Ilanna Deihl 10/29/2014, 11:24 AM

## 2014-10-30 ENCOUNTER — Inpatient Hospital Stay (HOSPITAL_COMMUNITY): Payer: Medicare Other | Admitting: Speech Pathology

## 2014-10-30 ENCOUNTER — Inpatient Hospital Stay (HOSPITAL_COMMUNITY): Payer: Medicare Other | Admitting: Occupational Therapy

## 2014-10-30 ENCOUNTER — Inpatient Hospital Stay (HOSPITAL_COMMUNITY): Payer: Medicare Other | Admitting: Physical Therapy

## 2014-10-30 DIAGNOSIS — I952 Hypotension due to drugs: Secondary | ICD-10-CM

## 2014-10-30 LAB — CBC
HCT: 30.4 % — ABNORMAL LOW (ref 36.0–46.0)
Hemoglobin: 10.3 g/dL — ABNORMAL LOW (ref 12.0–15.0)
MCH: 28.8 pg (ref 26.0–34.0)
MCHC: 33.9 g/dL (ref 30.0–36.0)
MCV: 84.9 fL (ref 78.0–100.0)
Platelets: 231 10*3/uL (ref 150–400)
RBC: 3.58 MIL/uL — ABNORMAL LOW (ref 3.87–5.11)
RDW: 13.8 % (ref 11.5–15.5)
WBC: 4.1 10*3/uL (ref 4.0–10.5)

## 2014-10-30 LAB — BASIC METABOLIC PANEL
ANION GAP: 11 (ref 5–15)
BUN: 19 mg/dL (ref 6–23)
CALCIUM: 8.9 mg/dL (ref 8.4–10.5)
CO2: 23 mmol/L (ref 19–32)
Chloride: 99 mmol/L (ref 96–112)
Creatinine, Ser: 0.58 mg/dL (ref 0.50–1.10)
GLUCOSE: 137 mg/dL — AB (ref 70–99)
Potassium: 4.3 mmol/L (ref 3.5–5.1)
Sodium: 133 mmol/L — ABNORMAL LOW (ref 135–145)

## 2014-10-30 NOTE — Progress Notes (Signed)
Physical Therapy Session Note  Patient Details  Name: Sharon [name] MRN: [id number] Date of Birth: [birth year]  Today's Date: 10/30/2014 PT Individual Time: 1115-1215 PT Individual Time Calculation (min): 60 min   Short Term Goals: Week 1:  PT Short Term Goal 1 (Week 1): Patient will perform bed mobility with supervision 50% of time.  PT Short Term Goal 2 (Week 1): Patient will transfer bed <> wheelchair with min A 75% of time.  PT Short Term Goal 3 (Week 1): Patient will demonstrate dynamic standing balance x 5 min with consistent min A. PT Short Term Goal 4 (Week 1): Patient will demonstrate sustained attention to functional task x 60 sec with max cues.  PT Short Term Goal 5 (Week 1): Patient will demonstrate functional problem solving for basic mobility task with max cues.   Skilled Therapeutic Interventions/Progress Updates:  Pt was seen bedside in the am. Pt sleeping but would arouse to verbal stimuli, gives simple one or two words answers then drifts back off. Nurse at bedside, stating pt having difficulty with BP, MD ordered TED hoses. Donned TED hoses in supine. BP as stated below. Pt willing to attempt edge of bed. Pt transferred supine to edge of bed with max A and verbal cues. Pt required max verbal cues to sit on edge of bed. Pt tolerated edge of bed about 4 minutes and began to c/o dizziness, BP was retaken with results stated below. Pt returned to supine with min A. Pt moved up in bed with total A. Notified pt's nurse of orthostatics with TED hoses on. Performed LE exercises in bed, pt required max encouragement for minimal participation. At end of treatment, family at bedside, pt aroused briefly to interact with family then quickly drifted back off.    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   Pain: No c/o pain.   See FIM for current functional status  Therapy/Group: Individual Therapy  Rayford HalstedMitchell, Kelliann Pendergraph G 10/30/2014, 3:20 PM

## 2014-10-30 NOTE — Progress Notes (Signed)
Speech Language Pathology Daily Session Note  Patient Details  Name: Sharon [name] MRN: [id number] Date of Birth: 24-Oct-1945  Today's Date: 10/30/2014 SLP Individual Time: [id number] SLP Individual Time Calculation (min): 45 min  Short Term Goals: Week 1: SLP Short Term Goal 1 (Week 1): Patient will orient to place, time and situation with use of external aids with Mod A question and verbal cues.  SLP Short Term Goal 2 (Week 1): Patient will demonstrate functional problem solving for basic and familiar self-care tasks with Max A verbal cues.  SLP Short Term Goal 3 (Week 1): Patient will sustain attention to a functional task for 5 minutes with Mod A verbal cues for redirection.  SLP Short Term Goal 4 (Week 1): Patient will consume current diet with minimal overt s/s of aspiration with Mod A verbal cues for use of swallowing compensatory strategies.  SLP Short Term Goal 5 (Week 1): Patient will utilize speech intelligibility strategies with Mod A verbal cues with 75% of observable opportunities to increase intelligibility to 75% at the phrase level.   Skilled Therapeutic Interventions: Skilled ST intervention provided with focus on dysphagia and cognitive goals. Pt seen in room, at bedside for ST tx session. Slp provided max multimodal cues to follow 1-step commands, in order to complete ADLs with caregiver assistance. Pt was 60% accurate with 1-step directions, in minimally distracting environment. Pt noted with decreased attention, requiring max multimodal cues to increase focus and engagement in tasks. Pt attended to snack for 2 minutes at a time before requiring redirection. No overt s/s of aspiration noted with HTL via tsp. Slp observed pt taking meds, whole with puree, without difficulty. Pt cleared all meds and boluses without liquid wash, no oral stasis.    FIM:  Comprehension Comprehension Mode: Auditory Comprehension: 1-Understands basic less than 25% of the time/requires cueing 75% of  the time Expression Expression Mode: Verbal Expression: 1-Expresses basis less than 25% of the time/requires cueing greater than 75% of the time. Social Interaction Social Interaction: 2-Interacts appropriately 25 - 49% of time - Needs frequent redirection. Problem Solving Problem Solving: 1-Solves basic less than 25% of the time - needs direction nearly all the time or does not effectively solve problems and may need a restraint for safety Memory Memory: 1-Recognizes or recalls less than 25% of the time/requires cueing greater than 75% of the time FIM - Eating Eating Activity: 1: Helper feeds patient  Pain Pain Assessment Pain Assessment: No/denies pain  Therapy/Group: Individual Therapy  Siaosi Alter, Kara PacerLeah N 10/30/2014, 12:38 PM

## 2014-10-30 NOTE — Progress Notes (Signed)
[place] PHYSICAL MEDICINE & REHABILITATION     PROGRESS NOTE    Subjective/Complaints: Sitting in bed pleasantly confused. "Dr Larina BrasStone" Denies pain,sob,c/n/v/d/cp, discussed 4/8 orthostatic episode with RN   Objective: Vital Signs: Blood pressure 141/51, pulse 60, temperature 98.6 F (37 C), temperature source Oral, resp. rate 20, height 5\' 4"  (1.626 m), weight 46.267 kg (102 lb), SpO2 100 %. No results found. No results for input(s): WBC, HGB, HCT, PLT in the last 72 hours. No results for input(s): NA, K, CL, GLUCOSE, BUN, CREATININE, CALCIUM in the last 72 hours.  Invalid input(s): CO CBG (last 3)  No results for input(s): GLUCAP in the last 72 hours.  Wt Readings from Last 3 Encounters:  10/30/14 46.267 kg (102 lb)  10/25/14 51.302 kg (113 lb 1.6 oz)  07/09/14 49.17 kg (108 lb 6.4 oz)    Physical Exam:  Constitutional:  69 year old right handed frail female. No acute distress HENT:  Head: Normocephalic.  Eyes: EOM are normal. Pupils are equal, round, and reactive to light.     Neck: Normal range of motion. Neck supple. No tracheal deviation present. No thyromegaly present.  Cardiovascular: Normal rate and regular rhythm.  No murmur heard. Respiratory: Effort normal and breath sounds normal. No respiratory distress. She has no wheezes. She has no rales.  GI: Soft. Bowel sounds are normal. She exhibits no distension. There is no tenderness. There is no rebound.  Musculoskeletal: She exhibits tenderness. She exhibits no edema.  She moves all extremities  Neurological: Coordination abnormal.  Patient is alert flat effect. Follows simple commands. Oriented to name and hospital---not time,reason. Moves all 4's fairly equally with inconsistent effort. Skin: Skin is warm and dry.  Psychiatric:  Remains confused, non-agitated  Assessment/Plan: 1. Functional deficits secondary to TBI which require 3+ hours per day of interdisciplinary therapy in a comprehensive inpatient  rehab setting. Physiatrist is providing close team supervision and 24 hour management of active medical problems listed below. Physiatrist and rehab team continue to assess barriers to discharge/monitor patient progress toward functional and medical goals.  FIM: FIM - Bathing Bathing Steps Patient Completed: Chest, Right Arm, Left Arm, Abdomen, Front perineal area, Right upper leg, Left upper leg, Right lower leg (including foot), Left lower leg (including foot) Bathing: 4: Min-Patient completes 8-9 6747f 10 parts or 75+ percent  FIM - Upper Body Dressing/Undressing Upper body dressing/undressing steps patient completed: Thread/unthread right bra strap, Thread/unthread right sleeve of pullover shirt/dresss, Put head through opening of pull over shirt/dress, Thread/unthread left sleeve of pullover shirt/dress, Pull shirt over trunk Upper body dressing/undressing: 4: Min-Patient completed 75 plus % of tasks FIM - Lower Body Dressing/Undressing Lower body dressing/undressing steps patient completed: Thread/unthread right underwear leg, Thread/unthread left underwear leg, Pull underwear up/down, Don/Doff left sock, Don/Doff right sock, Thread/unthread right pants leg, Thread/unthread left pants leg Lower body dressing/undressing: 4: Min-Patient completed 75 plus % of tasks  FIM - Toileting Toileting steps completed by patient: Performs perineal hygiene Toileting Assistive Devices: Grab bar or rail for support Toileting: 2: Max-Patient completed 1 of 3 steps  FIM - Diplomatic Services operational officerToilet Transfers Toilet Transfers Assistive Devices: Grab bars Toilet Transfers: 4-To toilet/BSC: Min A (steadying Pt. > 75%), 4-From toilet/BSC: Min A (steadying Pt. > 75%)  FIM - Bed/Chair Transfer Bed/Chair Transfer Assistive Devices: Arm rests, HOB elevated Bed/Chair Transfer: 2: Supine > Sit: Max A (lifting assist/Pt. 25-49%), 5: Sit > Supine: Supervision (verbal cues/safety issues), 4: Bed > Chair or W/C: Min A (steadying Pt. >  75%), 4:  Chair or W/C > Bed: Min A (steadying Pt. > 75%)  FIM - Locomotion: Wheelchair Locomotion: Wheelchair: 0: Activity did not occur FIM - Locomotion: Ambulation Locomotion: Ambulation Assistive Devices: Other (comment) (HHA x 2) Ambulation/Gait Assistance: 1: +2 Total assist (min-mod A) Locomotion: Ambulation: 1: Two helpers  Comprehension Comprehension Mode: Auditory Comprehension: 1-Understands basic less than 25% of the time/requires cueing 75% of the time  Expression Expression Mode: Verbal Expression: 1-Expresses basis less than 25% of the time/requires cueing greater than 75% of the time.  Social Interaction Social Interaction: 2-Interacts appropriately 25 - 49% of time - Needs frequent redirection.  Problem Solving Problem Solving: 1-Solves basic less than 25% of the time - needs direction nearly all the time or does not effectively solve problems and may need a restraint for safety  Memory Memory: 1-Recognizes or recalls less than 25% of the time/requires cueing greater than 75% of the time  Medical Problem List and Plan: 1. Functional deficits secondary to TBI after a fall with left frontal, left temporal bleed and right cerebellar contusion as well as right basal skull fractures 2. DVT Prophylaxis/Anticoagulation: SCDs. Monitor for any signs of DVT 3. Pain Management: Tylenol as needed 4. Mood/bipolar disorder/dementia: Celexa 20 mg daily, Seroquel 300 mg twice a day, Ativan every 4 hours as needed. Fairly substantial dementia at baseline  -observe for mood swings or alterations in arousal--have not seen that so far here 5. Neuropsych: This patient is not capable of making decisions on her own behalf. 6. Skin/Wound Care: Routine skin checks 7. Fluids/Electrolytes/Nutrition: encouarge PO, RD follow up as well 8. Seizure disorder. Keppra 500 mg twice a day. Monitor for any seizure activity 9. Dysphagia. Dysphagia 100 thick liquids. Monitor hydration 10.  Hypertension. Lasix 20 mg daily, lisinopril 2.5 mg daily. Monitor with increased mobility  -recheck labs monday 11. Urinary frequency. Having large PVR's. DC enablex , continue Flomax 0.4 mg daily at bedtime. Continue check PVR's and cath as needed for volumes greater than 350cc  -check ua,cx 12.  Orhtostatic Hypotension, suspect Flomax since this is a recent addition, will d/c, will also hold lisinopril for now, restart if BPs creep up  LOS (Days) 5 A FACE TO FACE EVALUATION WAS PERFORMED                                                               Claudette Laws E 10/30/2014 10:34 AM

## 2014-10-30 NOTE — Progress Notes (Signed)
Occupational Therapy Session Note  Patient Details  Name: Sharon [name] MRN: [id number] Date of Birth: [birth year]  Today's Date: 10/30/2014 OT Individual Time:  -   1300-1400  (60 min)  1st session      Short Term Goals: Week 1:  OT Short Term Goal 1 (Week 1): Pt will consistently be oriented x3 with max cues OT Short Term Goal 2 (Week 1): Pt will sustain attention to functional task for 30 seconds with max cues OT Short Term Goal 3 (Week 1): Pt will stand for 30 seconds during functional activity with mod assist OT Short Term Goal 4 (Week 1): Pt will initiate and complete bathing with mod cues and min assist OT Short Term Goal 5 (Week 1): Pt will utilize LUE 50% of time during self-care task with min cues Week 2:     Skilled Therapeutic Interventions/Progress Updates:     1st session:   Pt. Lying in bed with family present.  BP= 117/59 supine; after 10 minutes 120/63 supine.  Pt. Rolled to left side.  She washed face but would not follow up with any more activity.  Pt. Rolled to right side and had BM.and urine.  Changed LB clothes.  Sat Pt EOB.  Pt. Presenting with low tone and floppy.  Transferred to wc with +2 assist.    Pt. Sat in wcand placed safety belt .  See below for vitals.  Pt. Taken to nursing station and left with RN.    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  Pt. Sitting at nursing station upon OT arrival.  Pt. More alert this pm.  Pt. Reported she had to go to bathroom.  Transferred to toilet with minimal assist.  Pt. Had BM and urine.  Needed assist with cleaning self after BM.  Pt. Transferred to wc and sat at sink to wash hands, comb hair and brush mouth out with set up assist.  Pt. Transferred to bed and sat EOB with SBA.  Pt oriented to person and address.  Laid down in bed with minimal assist Left in bed with all needs in place.       See FIM for current functional status  Therapy/Group: Individual Therapy  Humberto Sealsdwards, Rielly Brunn J 10/30/2014, 1:34 PM

## 2014-10-31 ENCOUNTER — Inpatient Hospital Stay (HOSPITAL_COMMUNITY): Payer: Medicare Other | Admitting: Physical Therapy

## 2014-10-31 NOTE — Progress Notes (Signed)
Social Work  Social Work Assessment and Plan  Patient Details  Name: Sharon [name] MRN: [id number] Date of Birth: 1946/01/02  Today's Date: 10/28/2014  Problem List:  Patient Active Problem List   Diagnosis Date Noted  . 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 Frontal lobe contusion   . Hypoxemia 10/21/2014  . Pulmonary edema   . Intracerebral hemorrhage 10/20/2014  . Intracranial hemorrhage 10/14/2014  . Other pancytopenia 06/03/2014   Past Medical History:  Past Medical History  Diagnosis Date  . Bipolar 1 disorder   . Dementia   . Other pancytopenia 06/03/2014  . Stroke    Past Surgical History:  Past Surgical History  Procedure Laterality Date  . Cholecystectomy     Social History:  reports that she has never smoked. She has never used smokeless tobacco. She reports that she does not drink alcohol or use illicit drugs.  Family / Support Systems Marital Status: Married Patient Roles: Spouse, Parent Spouse/Significant Other: husband, Sharon [name] @ [phone](H) [phone] or ([phone]) [phone] Children: one son, Sharon [name] (local) @ (C) [phone] Anticipated Caregiver: husband. Husband also shares he may be looking into getting some aide support. Ability/Limitations of Caregiver: no limitations Caregiver Availability: 24/7 Family Dynamics: Husband has been providing 24/7 care for pt for "years" and appears very attentive and supportive.  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  Oriented to person only.  Sits and stares at myself and husband as we talk but unable to offer any response herself.  Tongue thrusting throughout interview and appears internally distracted. Recent Psychosocial Issues: Husband reports significant cogntive decline over the past year Pyschiatric History: Husband notes pt was diagnosed with "the second worst case of bipolar disease in the state" a few months after their son was born.  Has had many hospitalizations over the years and now with significant dementia as well. Followed by Sharon [name] for bipolar management.  Substance Abuse History: None  Patient / Family Perceptions, Expectations & Goals Pt/Family understanding of illness & functional limitations: Per husband, he has been providing 24/7 care to pt for many years and much closer supervision for the past year.  Notes she has had an increase in falls as well.  Husband notes that pt's current presentation is not significantly differenct than her prior level of function. Husband's information keeps comparing her current function to where she was a year ago.  Had to direct him to answering how function differnet than just PTA.  He does not even talk about her TBI. Premorbid pt/family roles/activities: As noted, husband has been providing 24/7 care to pt for many years and does not indicate much diffence in overall functioning compared to current vs prior to this TBI Anticipated changes in roles/activities/participation: Really do not anticipate much change in roles as husband will continue to provide care or hire some assistance into the home. Pt/family expectations/goals: Husband unable to articulate a goal for pt's CIR stay.  Community [employer] Community Agencies: None Premorbid Home  Care/DME Agencies: None Transportation available at  discharge: yes  Discharge Planning Living Arrangements: Spouse/significant other Support Systems: Spouse/significant other, Children Type of Residence: Private residence Insurance Resources: [employer], Media planner (specify) Print production planner) Surveyor, quantity Resources: Restaurant manager, fast food Screen Referred: No Living Expenses: Own Money Management: Spouse Does the patient have any problems obtaining your medications?: No Home Management: husband Patient/Family Preliminary Plans: pt to return home with husband resuming primary care for her.  Husband notes he may hire some private duty caregivers Social Work Anticipated Follow Up Needs: HH/OP Expected length of stay: 10 days  Clinical Impression Severely, cognitively impaired woman here following a fall with TBI.  With known hx of severe bipolar dz as well as documented dementia.  Husband was providing 24/7 care to her PTA and cannot really identify much significant difference in her overall function from PTA.  Will assist husband in arrangement of home support as he would prefer.  I.e. Private duty care, adult day care, etc.  Continue to follow.  Sharon [name] 10/28/2014, 11:45 PM

## 2014-10-31 NOTE — Progress Notes (Signed)
Physical Therapy Session Note  Patient Details  Name: Sharon [name] MRN: [id number] Date of Birth: [birth year]  Today's Date: 10/31/2014 PT Individual Time: 0845-0930 PT Individual Time Calculation (min): 45 min   Short Term Goals: Week 1:  PT Short Term Goal 1 (Week 1): Patient will perform bed mobility with supervision 50% of time.  PT Short Term Goal 2 (Week 1): Patient will transfer bed <> wheelchair with min A 75% of time.  PT Short Term Goal 3 (Week 1): Patient will demonstrate dynamic standing balance x 5 min with consistent min A. PT Short Term Goal 4 (Week 1): Patient will demonstrate sustained attention to functional task x 60 sec with max cues.  PT Short Term Goal 5 (Week 1): Patient will demonstrate functional problem solving for basic mobility task with max cues.   Skilled Therapeutic Interventions/Progress Updates:  Pt was seen in the am. Pt willing to participate. Pt performed multiple sit to stand transfers with min A and verbal cues. Pt ambulated 10', 10', 40' and 150 ' with hand hold assist, min x 1 and verbal cues. Pt performed toilet transfers with min A and verbal cues. Pt ascended/descended 4 stairs and 12 stairs with B rails and min A with verbal cues. Following treatment, pt was left sitting in w/c with quick release belt in place at nurses station.   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

## 2014-10-31 NOTE — Progress Notes (Signed)
[facility] PHYSICAL MEDICINE & REHABILITATION     PROGRESS NOTE    Subjective/Complaints: Sitting chair, No c/o dizziness, wants to get seat belt off   ROS limited by mental status Objective: Vital Signs: Blood pressure 109/55, pulse 68, temperature 97.9 F (36.6 C), temperature source Oral, resp. rate 16, height  (1.626 m), weight 45.7 kg (100 lb 12 oz), SpO2 95 %. No results found.  Recent Labs  10/30/14 1310  WBC 4.1  HGB 10.3*  HCT 30.4*  PLT 231    Recent Labs  10/30/14 1310  NA 133*  K 4.3  CL 99  GLUCOSE 137*  BUN 19  CREATININE 0.58  CALCIUM 8.9   CBG (last 3)  No results for input(s): GLUCAP in the last 72 hours.  Wt Readings from Last 3 Encounters:  10/31/14 45.7 kg (100 lb 12 oz)  10/25/14 51.302 kg (113 lb 1.6 oz)  07/09/14 49.17 kg (108 lb 6.4 oz)    Physical Exam:  Constitutional:  69 year old right handed frail female. No acute distress HENT:  Head: Normocephalic.  Eyes: EOM are normal. Pupils are equal, round, and reactive to light.     Neck: Normal range of motion. Neck supple. No tracheal deviation present. No thyromegaly present.  Cardiovascular: Normal rate and regular rhythm.  No murmur heard. Respiratory: Effort normal and breath sounds normal. No respiratory distress. She has no wheezes. She has no rales.  GI: Soft. Bowel sounds are normal. She exhibits no distension. There is no tenderness. There is no rebound.  Musculoskeletal: She exhibits tenderness. She exhibits no edema.  She moves all extremities  Neurological: Coordination abnormal.  Patient is alert flat effect. Follows simple commands. Oriented to name and hospital--- Moves all 4's fairly equally with inconsistent effort. Skin: Skin is warm and dry.  Psychiatric:  Remains confused, non-agitated  Assessment/Plan: 1. Functional deficits secondary to TBI which require 3+ hours per day of interdisciplinary therapy in a comprehensive inpatient rehab  setting. Physiatrist is providing close team supervision and 24 hour management of active medical problems listed below. Physiatrist and rehab team continue to assess barriers to discharge/monitor patient progress toward functional and medical goals.  FIM: FIM - Bathing Bathing Steps Patient Completed: Chest, Right Arm, Left Arm, Abdomen, Front perineal area, Right upper leg, Left upper leg, Right lower leg (including foot), Left lower leg (including foot) Bathing: 4: Min-Patient completes 8-9 62f 10 parts or 75+ percent  FIM - Upper Body Dressing/Undressing Upper body dressing/undressing steps patient completed: Thread/unthread right bra strap, Thread/unthread right sleeve of pullover shirt/dresss, Put head through opening of pull over shirt/dress, Thread/unthread left sleeve of pullover shirt/dress, Pull shirt over trunk Upper body dressing/undressing: 4: Min-Patient completed 75 plus % of tasks FIM - Lower Body Dressing/Undressing Lower body dressing/undressing steps patient completed: Thread/unthread right underwear leg, Thread/unthread left underwear leg, Pull underwear up/down, Don/Doff left sock, Don/Doff right sock, Thread/unthread right pants leg, Thread/unthread left pants leg Lower body dressing/undressing: 4: Min-Patient completed 75 plus % of tasks  FIM - Toileting Toileting steps completed by patient: Performs perineal hygiene Toileting Assistive Devices: Grab bar or rail for support Toileting: 1: Total-Patient completed zero steps, helper did all 3  FIM - Diplomatic Services operational officer Devices: Grab bars Toilet Transfers: 3-From toilet/BSC: Mod A (lift or lower assist), 3-To toilet/BSC: Mod A (lift or lower assist)  FIM - Banker Devices: Arm rests, HOB elevated Bed/Chair Transfer: 2: Supine > Sit: Max A (lifting assist/Pt. 25-49%),  4: Sit > Supine: Min A (steadying pt. > 75%/lift 1 leg)  FIM - Locomotion:  Wheelchair Locomotion: Wheelchair: 0: Activity did not occur FIM - Locomotion: Ambulation Locomotion: Ambulation Assistive Devices: Other (comment) (HHA x 2) Ambulation/Gait Assistance: 1: +2 Total assist (min-mod A) Locomotion: Ambulation: 1: Two helpers  Comprehension Comprehension Mode: Auditory Comprehension: 1-Understands basic less than 25% of the time/requires cueing 75% of the time  Expression Expression Mode: Verbal Expression: 1-Expresses basis less than 25% of the time/requires cueing greater than 75% of the time.  Social Interaction Social Interaction: 2-Interacts appropriately 25 - 49% of time - Needs frequent redirection.  Problem Solving Problem Solving: 1-Solves basic less than 25% of the time - needs direction nearly all the time or does not effectively solve problems and may need a restraint for safety  Memory Memory: 1-Recognizes or recalls less than 25% of the time/requires cueing greater than 75% of the time  Medical Problem List and Plan: 1. Functional deficits secondary to TBI after a fall with left frontal, left temporal bleed and right cerebellar contusion as well as right basal skull fractures 2. DVT Prophylaxis/Anticoagulation: SCDs. Monitor for any signs of DVT 3. Pain Management: Tylenol as needed 4. Mood/bipolar disorder/dementia: Celexa 20 mg daily, Seroquel 300 mg twice a day, Ativan every 4 hours as needed. Fairly substantial dementia at baseline  -observe for mood swings or alterations in arousal--have not seen that so far here 5. Neuropsych: This patient is not capable of making decisions on her own behalf. 6. Skin/Wound Care: Routine skin checks 7. Fluids/Electrolytes/Nutrition: encouarge PO, RD follow up as well 8. Seizure disorder. Keppra 500 mg twice a day. Monitor for any seizure activity 9. Dysphagia. Dysphagia 100 thick liquids. Monitor hydration 10. Hypertension. Lasix 20 mg daily, lisinopril 2.5 mg daily. Monitor with increased  mobility  -recheck labs monday 11. Urinary frequency. Having large PVR's. DC enablex , discont Flomax due to orthostatic changes. Continue check PVR's and cath as needed for volumes greater than 350cc  -check ua,cx 12.  Orhtostatic Hypotension, suspect Flomax since this is a recent addition, will d/c, will also hold lisinopril for now, restart if BPs creep up  LOS (Days) 6 A FACE TO FACE EVALUATION WAS PERFORMED                                                               Erick ColaceKIRSTEINS,Dailey Buccheri E 10/31/2014 8:37 AM

## 2014-10-31 NOTE — Care Management Note (Signed)
[facility] Individual Statement of Services  Patient Name:  Sharon [name]  Date:  10/28/2014  Welcome to the [facility]patient [facility].  Our goal is to provide you with an individualized program based on your diagnosis and situation, designed to meet your specific needs.  With this comprehensive rehabilitation program, you will be expected to participate in at least 3 hours of rehabilitation therapies Monday-Friday, with modified therapy programming on the weekends.  Your rehabilitation program will include the following services:  Physical Therapy (PT), Occupational Therapy (OT), Speech Therapy (ST), 24 hour per day rehabilitation nursing, Therapeutic Recreaction (TR), Neuropsychology, Case Management (Social Worker), Rehabilitation Medicine, Nutrition Services and Pharmacy Services  Weekly team conferences will be held on Tuesdays to discuss your progress.  Your Social Worker will talk with you frequently to get your input and to update you on team discussions.  Team conferences with you and your family in attendance may also be held.  Expected length of stay: 10-12 days  Overall anticipated outcome: supervision/ min assist  Depending on your progress and recovery, your program may change. Your Social Worker will coordinate services and will keep you informed of any changes. Your Social Worker's name and contact numbers are listed  below.  The following services may also be recommended but are not provided by the Inpatient [facility]:   Home Health Rehabiltiation Services  Outpatient Rehabilitation Services  Skilled Nursing/ Assisted Living  Senior Day Program    Arrangements will be made to provide these services after discharge if needed.  Arrangements include referral to agencies that provide these services.  Your insurance has been verified to be:  Medicare and [employer] Your primary doctor is:  Dr. Timothy Lassousso  Pertinent information will be shared  with your doctor and your insurance company.  Social Worker:  CharlotteLucy Joe Gee, [place] [phone] or ([phone]) [phone]   Information discussed with and copy given to patient by: Amada JupiterHOYLE, Afifa Truax, 10/28/2014, 11:33 AM

## 2014-11-01 ENCOUNTER — Inpatient Hospital Stay (HOSPITAL_COMMUNITY): Payer: Medicare Other

## 2014-11-01 ENCOUNTER — Inpatient Hospital Stay (HOSPITAL_COMMUNITY): Payer: Medicare Other | Admitting: Physical Therapy

## 2014-11-01 ENCOUNTER — Inpatient Hospital Stay (HOSPITAL_COMMUNITY): Payer: Medicare Other | Admitting: Speech Pathology

## 2014-11-01 LAB — URINE CULTURE

## 2014-11-01 NOTE — Progress Notes (Signed)
[place] PHYSICAL MEDICINE & REHABILITATION     PROGRESS NOTE    Subjective/Complaints: Resting comfortably. Pleasantly confused.  Objective: Vital Signs: Blood pressure 144/56, pulse 62, temperature 98 F (36.7 C), temperature source Oral, resp. rate 16, height  (1.626 m), weight 45.4 kg (100 lb 1.4 oz), SpO2 100 %. No results found.  Recent Labs  10/30/14 1310  WBC 4.1  HGB 10.3*  HCT 30.4*  PLT 231    Recent Labs  10/30/14 1310  NA 133*  K 4.3  CL 99  GLUCOSE 137*  BUN 19  CREATININE 0.58  CALCIUM 8.9   CBG (last 3)  No results for input(s): GLUCAP in the last 72 hours.  Wt Readings from Last 3 Encounters:  11/01/14 45.4 kg (100 lb 1.4 oz)  10/25/14 51.302 kg (113 lb 1.6 oz)  07/09/14 49.17 kg (108 lb 6.4 oz)    Physical Exam:  Constitutional:  69 year old right handed frail female. No acute distress HENT:  Head: Normocephalic.  Eyes: EOM are normal. Pupils are equal, round, and reactive to light.     Neck: Normal range of motion. Neck supple. No tracheal deviation present. No thyromegaly present.  Cardiovascular: Normal rate and regular rhythm.  No murmur heard. Respiratory: Effort normal and breath sounds normal. No respiratory distress. She has no wheezes. She has no rales.  GI: Soft. Bowel sounds are normal. She exhibits no distension. There is no tenderness. There is no rebound.  Musculoskeletal: She exhibits tenderness. She exhibits no edema.  She moves all extremities  Neurological: Coordination abnormal.  Patient is alert flat effect. Follows simple commands. Oriented to name and hospital---not time,reason. Moves all 4's fairly equally with inconsistent effort. Skin: Skin is warm and dry.  Psychiatric:  Remains confused, non-agitated  Assessment/Plan: 1. Functional deficits secondary to TBI which require 3+ hours per day of interdisciplinary therapy in a comprehensive inpatient rehab setting. Physiatrist is providing close team  supervision and 24 hour management of active medical problems listed below. Physiatrist and rehab team continue to assess barriers to discharge/monitor patient progress toward functional and medical goals.  FIM: FIM - Bathing Bathing Steps Patient Completed: Chest, Right Arm, Left Arm, Abdomen, Front perineal area, Right upper leg, Left upper leg, Right lower leg (including foot), Left lower leg (including foot) Bathing: 4: Min-Patient completes 8-9 59f 10 parts or 75+ percent  FIM - Upper Body Dressing/Undressing Upper body dressing/undressing steps patient completed: Thread/unthread right bra strap, Thread/unthread right sleeve of pullover shirt/dresss, Put head through opening of pull over shirt/dress, Thread/unthread left sleeve of pullover shirt/dress, Pull shirt over trunk Upper body dressing/undressing: 4: Min-Patient completed 75 plus % of tasks FIM - Lower Body Dressing/Undressing Lower body dressing/undressing steps patient completed: Thread/unthread right underwear leg, Thread/unthread left underwear leg, Pull underwear up/down, Don/Doff left sock, Don/Doff right sock, Thread/unthread right pants leg, Thread/unthread left pants leg Lower body dressing/undressing: 4: Min-Patient completed 75 plus % of tasks  FIM - Toileting Toileting steps completed by patient: Performs perineal hygiene Toileting Assistive Devices: Grab bar or rail for support Toileting: 1: Total-Patient completed zero steps, helper did all 3  FIM - Diplomatic Services operational officer Devices: Grab bars Toilet Transfers: 3-From toilet/BSC: Mod A (lift or lower assist), 3-To toilet/BSC: Mod A (lift or lower assist)  FIM - Banker Devices: Arm rests, HOB elevated Bed/Chair Transfer: 4: Bed > Chair or W/C: Min A (steadying Pt. > 75%), 4: Chair or W/C > Bed: Min A (steadying  Pt. > 75%)  FIM - Locomotion: Wheelchair Locomotion: Wheelchair: 0: Activity did not occur FIM  - Locomotion: Ambulation Locomotion: Ambulation Assistive Devices: Other (comment) (hand hold assist) Ambulation/Gait Assistance: 4: Min assist Locomotion: Ambulation: 4: Travels 150 ft or more with minimal assistance (Pt.>75%)  Comprehension Comprehension Mode: Auditory Comprehension: 1-Understands basic less than 25% of the time/requires cueing 75% of the time  Expression Expression Mode: Verbal Expression: 1-Expresses basis less than 25% of the time/requires cueing greater than 75% of the time.  Social Interaction Social Interaction: 2-Interacts appropriately 25 - 49% of time - Needs frequent redirection.  Problem Solving Problem Solving: 1-Solves basic less than 25% of the time - needs direction nearly all the time or does not effectively solve problems and may need a restraint for safety  Memory Memory: 1-Recognizes or recalls less than 25% of the time/requires cueing greater than 75% of the time  Medical Problem List and Plan: 1. Functional deficits secondary to TBI after a fall with left frontal, left temporal bleed and right cerebellar contusion as well as right basal skull fractures 2. DVT Prophylaxis/Anticoagulation: SCDs. Monitor for any signs of DVT 3. Pain Management: Tylenol as needed 4. Mood/bipolar disorder/dementia: Celexa 20 mg daily, Seroquel 300 mg twice a day, Ativan every 4 hours as needed. Fairly substantial dementia at baseline  -observe for mood swings or alterations in arousal--have not seen that so far here 5. Neuropsych: This patient is not capable of making decisions on her own behalf. 6. Skin/Wound Care: Routine skin checks 7. Fluids/Electrolytes/Nutrition: encouarge PO, RD follow up as well 8. Seizure disorder. Keppra 500 mg twice a day. Monitor for any seizure activity 9. Dysphagia. Dysphagia 100 thick liquids. Monitor hydration 10. Hypertension. Lasix 20 mg daily, lisinopril 2.5 mg daily. Monitor with increased mobility  -recent labs normal. Follow  up labs today 11. Urinary frequency. Having large PVR's. DC enablex , continue Flomax 0.4 mg daily at bedtime. Continue check PVR's and cath as needed for volumes greater than 350cc  -check ua,cx 12.  Orhtostatic Hypotension---flomax and lisinopril stopped---observe for further pattern today LOS (Days) 7 A FACE TO FACE EVALUATION WAS PERFORMED                                                               Cordarrius Coad T 11/01/2014 8:16 AM

## 2014-11-01 NOTE — Progress Notes (Signed)
Occupational Therapy Session Note  Patient Details  Name: Sharon GammaJoan L Mcconathy MRN: [id number] Date of Birth: September 03, 1945  Today's Date: 11/01/2014 OT Individual Time: 1000-1100 OT Individual Time Calculation (min): 60 min    Short Term Goals: Week 1:  OT Short Term Goal 1 (Week 1): Pt will consistently be oriented x3 with max cues OT Short Term Goal 2 (Week 1): Pt will sustain attention to functional task for 30 seconds with max cues OT Short Term Goal 3 (Week 1): Pt will stand for 30 seconds during functional activity with mod assist OT Short Term Goal 4 (Week 1): Pt will initiate and complete bathing with mod cues and min assist OT Short Term Goal 5 (Week 1): Pt will utilize LUE 50% of time during self-care task with min cues  Skilled Therapeutic Interventions/Progress Updates:    Pt resting in bed upon arrival.  Pt taking to someone who was not present in room.  Pt greeted therapist appropriately and used visual cues (calendar) to recall date.  Pt oriented to city and hospital.  Pt declined bathing but agreeable to changing clothing.  Pt continually requested to return to bed and required max encouragement to continue participating in therapy.  Pt requested to use toilet but was unsuccessful in voiding.   Pt returned to bed after observation of patient attempting to remove QRB.  Focus on activity tolerance, participation, functional transfers, sit<>stand, standing balance, orientation, and safety awareness.  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

## 2014-11-01 NOTE — Progress Notes (Signed)
Physical Therapy Weekly Progress Note  Patient Details  Name: Sharon [name] MRN: [id number] Date of Birth: Dec 07, 1945  Beginning of progress report period: October 26, 2014 End of progress report period: November 01, 2014  Today's Date: 11/01/2014 PT Individual Time: 5188-4166 PT Individual Time Calculation (min): 70 min   Patient has met 1 of 5 short term goals due to inconsistent arousal and attention throughout day limiting participation in therapy. Patient currently requires min guard to +2 assist for safety with all functional mobility. Patient also demonstrates inconsistent posterior trunk lean with standing and ambulation. Patient requires max multimodal cues to sustain attention to functional mobility tasks up to one minute in min to moderately distracting environment and to initiate functional tasks.   Patient continues to demonstrate the following deficits: muscle weakness, decreased cardiorespiratory endurance, impaired timing and sequencing, abnormal tone, unbalanced muscle activation, decreased coordination and decreased motor planning, decreased initiation, decreased attention, decreased awareness, decreased problem solving, decreased safety awareness, decreased memory and delayed processing and therefore will continue to benefit from skilled PT intervention to enhance overall performance with activity tolerance, balance, postural control, ability to compensate for deficits, functional use of  right upper extremity, right lower extremity, left upper extremity and left lower extremity, attention, awareness and coordination.  Patient progressing toward long term goals.  Continue plan of care.  PT Short Term Goals Week 1:  PT Short Term Goal 1 (Week 1): Patient will perform bed mobility with supervision 50% of time.  PT Short Term Goal 1 - Progress (Week 1): Progressing toward goal PT Short Term Goal 2 (Week 1): Patient will transfer bed <> wheelchair with min A 75% of time.  PT Short Term  Goal 2 - Progress (Week 1): Met PT Short Term Goal 3 (Week 1): Patient will demonstrate dynamic standing balance x 5 min with consistent min A. PT Short Term Goal 3 - Progress (Week 1): Progressing toward goal PT Short Term Goal 4 (Week 1): Patient will demonstrate sustained attention to functional task x 60 sec with max cues.  PT Short Term Goal 4 - Progress (Week 1): Progressing toward goal PT Short Term Goal 5 (Week 1): Patient will demonstrate functional problem solving for basic mobility task with max cues.  PT Short Term Goal 5 - Progress (Week 1): Progressing toward goal Week 2:  PT Short Term Goal 1 (Week 2): Patient will perform bed mobility with supervision 50% of time.  PT Short Term Goal 2 (Week 2): Patient will transfer bed <> wheelchair with consistent min A. PT Short Term Goal 3 (Week 2): Patient will demonstrate dynamic standing balance x 5 min with consistent min A. PT Short Term Goal 4 (Week 2): Patient will demonstrate sustained attention to functional task x 60 sec with max cues.  PT Short Term Goal 5 (Week 2): Patient will demonstrate functional problem solving for basic mobility task with max cues.   Skilled Therapeutic Interventions/Progress Updates:   Session focused on sustained attention, functional problem solving, following simple commands, task completion, standing balance, and activity tolerance. Patient received sitting in wheelchair with two sisters present. Patient required max encouragement to participate in therapy due to fatigue and patient attempting to lie down on multiple surfaces throughout session. With family prompting, patient recalled today's date and that today is sister's birthday but disoriented to year, own age, and age of sisters. Patient ambulated throughout rehab unit multiple times up to 500 ft with min HHA and no posterior trunk lean but demonstrated posterior  LOB with decreased assistance. With max verbal cues, patient able to complete functional  tasks of performing car transfer including buckling seat belt and closing door, picking up yoga blocks from floor and placing on shelf with min-mod A, negotiating up/down 4 stairs using 2 rails with S-min A. Seated on couch in ADL apartment, patient sustained attention to task of folding 3 towels x 2 min with max cues and placed towels in lower drawer of dresser. Patient required total multimodal cues to get up from bed in ADL apartment but once standing, easily redirected to task of locating refrigerator for magic cup. Patient required max A to find refrigerator and min cues to open freezer. Seated in day room, patient initiated eating 1/4 of magic cup with min cues. Seated in therapy gym, patient completed 25% of simple jigsaw with mod A and then able to attend to returning pieces to box for 30 sec at a time until task completed. Patient informed of room number in gym and able to recall correctly in hallway by RN station but required max A to path find to room. Patient retrieved honey thick orange juice when returning to room and consumed 50% of juice via tsp seated EOB with cues to decrease pace before patient returned to bed. Patient demonstrated improved arousal and sustained attention compared to previous sessions resulting in improved ability to follow commands and retain information. Patient left sidelying in bed with bed alarm on, RN notified of patient location.  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), sits without warning Restrictions Weight Bearing Restrictions: No Pain: Pain Assessment Pain Assessment: No/denies pain Locomotion : Ambulation Ambulation/Gait Assistance: 4: Min assist   See FIM for current functional status  Therapy/Group: Individual Therapy  Laretta Alstrom 11/01/2014, 3:43 PM

## 2014-11-01 NOTE — Progress Notes (Signed)
Speech Language Pathology Daily Session Note  Patient Details  Name: Sharon [name] MRN: [id number] Date of Birth: Oct 06, 1945  Today's Date: 11/01/2014 SLP Individual Time: 0900-1000 SLP Individual Time Calculation (min): 60 min  Short Term Goals: Week 1: SLP Short Term Goal 1 (Week 1): Patient will orient to place, time and situation with use of external aids with Mod A question and verbal cues.  SLP Short Term Goal 1 - Progress (Week 1): Progressing toward goal SLP Short Term Goal 2 (Week 1): Patient will demonstrate functional problem solving for basic and familiar self-care tasks with Max A verbal cues.  SLP Short Term Goal 2 - Progress (Week 1): Progressing toward goal SLP Short Term Goal 3 (Week 1): Patient will sustain attention to a functional task for 5 minutes with Mod A verbal cues for redirection.  SLP Short Term Goal 3 - Progress (Week 1): Progressing toward goal SLP Short Term Goal 4 (Week 1): Patient will consume current diet with minimal overt s/s of aspiration with Mod A verbal cues for use of swallowing compensatory strategies.  SLP Short Term Goal 4 - Progress (Week 1): Progressing toward goal SLP Short Term Goal 5 (Week 1): Patient will utilize speech intelligibility strategies with Mod A verbal cues with 75% of observable opportunities to increase intelligibility to 75% at the phrase level.  SLP Short Term Goal 5 - Progress (Week 1): Progressing toward goal  Skilled Therapeutic Interventions: Skilled ST intervention provided with focus on dysphagia and cognitive goals. Pt seen in room at bedside for treatment session. Pt noted to be awake, alert, responsive to questions, which is a signficant improvement over recent sessions. Pt was able to perform oral care after set up, using toothette swabs. SLP rinsed dentures and applied adhesive, after which pt independently placed both upper and lower dentures. Intelligibility was noted to improve markedly once dentures in place. Pt  self fed 1/2 tsp boluses of honey thick liquid with max cues to decrease rate. No overt s/s aspiration were observed during trials of po. Pt oriented to person, and was accurate for month and situation, but stated year as [id number]. Max visual and verbal cues were required for pt to use calendar on the wall to reorient.  Poor carryover within session noted, with mod-max cueing necessary to answer just the question asked. Pt perseverated on the current time throughout the session, and remarked that her husband was running late. At the end of the session, pt was able to attend to task of folding towels for 10 minutes with min cues to redirect.    FIM:  Comprehension Comprehension: 1-Understands basic less than 25% of the time/requires cueing 75% of the time Expression Expression Mode: Verbal Expression: 1-Expresses basis less than 25% of the time/requires cueing greater than 75% of the time. Social Interaction Social Interaction: 2-Interacts appropriately 25 - 49% of time - Needs frequent redirection. Problem Solving Problem Solving: 1-Solves basic less than 25% of the time - needs direction nearly all the time or does not effectively solve problems and may need a restraint for safety Memory Memory: 1-Recognizes or recalls less than 25% of the time/requires cueing greater than 75% of the time FIM - Eating Eating Activity: 5: Needs verbal cues/supervision  Pain Pain Assessment Pain Assessment: No/denies pain  Therapy/Group: Individual Therapy   Vrinda Heckstall B. DowlingBueche, [facility] - Psychiatric HospitalMSP, CCC-SLP [phone]  Leigh AuroraBueche, Codie Hainer Brown 11/01/2014, 12:40 PM

## 2014-11-01 NOTE — Progress Notes (Signed)
Speech Language Pathology Weekly Progress and Session Note  Patient Details  Name: Sharon [name] MRN: [id number] Date of Birth: 12-02-1945  Beginning of progress report period: October 25, 2014 End of progress report period: November 01, 2014  Today's Date: 11/01/2014 SLP Individual Time: 1300-1330 SLP Individual Time Calculation (min): 30 min  Short Term Goals: Week 1: SLP Short Term Goal 1 (Week 1): Patient will orient to place, time and situation with use of external aids with Mod A question and verbal cues.  SLP Short Term Goal 1 - Progress (Week 1): Not met SLP Short Term Goal 2 (Week 1): Patient will demonstrate functional problem solving for basic and familiar self-care tasks with Max A verbal cues.  SLP Short Term Goal 2 - Progress (Week 1): Not met SLP Short Term Goal 3 (Week 1): Patient will sustain attention to a functional task for 5 minutes with Mod A verbal cues for redirection.  SLP Short Term Goal 3 - Progress (Week 1): Not met SLP Short Term Goal 4 (Week 1): Patient will consume current diet with minimal overt s/s of aspiration with Mod A verbal cues for use of swallowing compensatory strategies.  SLP Short Term Goal 4 - Progress (Week 1): Not met SLP Short Term Goal 5 (Week 1): Patient will utilize speech intelligibility strategies with Mod A verbal cues with 75% of observable opportunities to increase intelligibility to 75% at the phrase level.  SLP Short Term Goal 5 - Progress (Week 1): Not met    New Short Term Goals: Week 2: SLP Short Term Goal 1 (Week 2): Patient will orient to place, time and situation with use of external aids with Mod A question and verbal cues.  SLP Short Term Goal 2 (Week 2): Patient will demonstrate functional problem solving for basic and familiar self-care tasks with Max A verbal cues.  SLP Short Term Goal 3 (Week 2): Patient will sustain attention to a functional task for 5 minutes with Mod A verbal cues for redirection.  SLP Short Term Goal 4  (Week 2): Patient will consume current diet with minimal overt s/s of aspiration with Mod A verbal cues for use of swallowing compensatory strategies.  SLP Short Term Goal 5 (Week 2): Patient will utilize speech intelligibility strategies with Max A verbal cues with 75% of observable opportunities to increase intelligibility to 75% at the phrase level.   Weekly Progress Updates: Patient has made limited and inconsistent gains this reporting period and has met 0 out of 5 STG's due to inconsistent arousal and subsequent limited participation and engagement in therapy. Patient currently requires Max A verbal and visual cues for use of external aids for orientation, Max A verbal, tactile, and visual cues for sustained attention for ~2 minutes in a quiet environment, initiation of functional tasks, and for use of swallowing and speech intelligibility strategies, and Max-Total A verbal, tactile, and visual cues for functional problem-solving. Patient with limited PO intake due to fatigue and is currently consuming Dys. 1 textures with honey-thick liquids via teaspoon; patient requires Max A verbal and visual cues for use of swallow strategies. Frequency of patient's verbal responses are inconsistent dependent upon fatigue level, with overall intelligibility of ~75% at the word level. Patient would continue to benefit from skilled SLP intervention to maximize her cognitive, speech, and swallowing functioning to enhance functional independence and decrease caregiver burden prior to discharge.   Intensity: Minumum of 1-2 x/day, 30 to 90 minutes Frequency: 3 to 5 out of 7 days  Duration/Length of Stay: 10-14 days Treatment/Interventions: Cognitive remediation/compensation;Cueing hierarchy;Dysphagia/aspiration precaution training;Therapeutic Activities;Speech/Language facilitation;Internal/external aids;Patient/family education;Functional tasks;Environmental controls   Daily Session  Skilled Therapeutic  Interventions: Skilled therapy session focused on patient arousal and subsequent cognitive goals. Upon arrival, patient was asleep in bed with family present and required Max A tactile and verbal cues and encouragement for arousal. Patient transferred to w/c with SLP as assist to facilitate arousal and required Mod-Max A verbal and tactile cues to initiate and accurately follow 1-step commands 50-75% of the time. Student further facilitated session with Max A verbal cues for answering simple biographic questions with 50% accuracy, sustained attention to task for ~2 minutes in a quiet environment and for use of external aid to orient to date; patient independently oriented to place and oriented to situation with question cue x1. Patient with less lethargy and greater arousal this session, with increased verbal utterances with overall intelligibility of ~75% at the word level. Of note, patient independently initiated needing to use restroom. Patient left upright in w/c with quick-release belt donned with NT present to supervise consumption of lunchtime meal. Continue with current plan of care.   FIM:  Comprehension Comprehension Mode: Auditory Comprehension: 2-Understands basic 25 - 49% of the time/requires cueing 51 - 75% of the time Expression Expression Mode: Verbal Expression: 1-Expresses basis less than 25% of the time/requires cueing greater than 75% of the time. Social Interaction Social Interaction: 2-Interacts appropriately 25 - 49% of time - Needs frequent redirection. Problem Solving Problem Solving: 1-Solves basic less than 25% of the time - needs direction nearly all the time or does not effectively solve problems and may need a restraint for safety Memory Memory: 1-Recognizes or recalls less than 25% of the time/requires cueing greater than 75% of the time  Pain Pain Assessment Pain Assessment: No/denies pain  Therapy/Group: Individual Therapy  Servando Snare 11/01/2014, 4:13  PM

## 2014-11-02 ENCOUNTER — Inpatient Hospital Stay (HOSPITAL_COMMUNITY): Payer: Medicare Other

## 2014-11-02 ENCOUNTER — Inpatient Hospital Stay (HOSPITAL_COMMUNITY): Payer: Medicare Other | Admitting: Physical Therapy

## 2014-11-02 ENCOUNTER — Inpatient Hospital Stay (HOSPITAL_COMMUNITY): Payer: Medicare Other | Admitting: Occupational Therapy

## 2014-11-02 ENCOUNTER — Inpatient Hospital Stay (HOSPITAL_COMMUNITY): Payer: Medicare Other | Admitting: Speech Pathology

## 2014-11-02 DIAGNOSIS — A499 Bacterial infection, unspecified: Secondary | ICD-10-CM

## 2014-11-02 DIAGNOSIS — N390 Urinary tract infection, site not specified: Secondary | ICD-10-CM

## 2014-11-02 MED ORDER — AMOXICILLIN 250 MG PO CAPS
250.0000 mg | ORAL_CAPSULE | Freq: Three times a day (TID) | ORAL | Status: AC
  Administered 2014-11-02 – 2014-11-06 (×14): 250 mg via ORAL
  Filled 2014-11-02 (×18): qty 1

## 2014-11-02 NOTE — Progress Notes (Signed)
Occupational Therapy Session Note  Patient Details  Name: Sharon [name] MRN: [id number] Date of Birth: 04/09/1946  Today's Date: 11/02/2014 OT Individual Time: 1300-1400 OT Individual Time Calculation (min): 60 min    Short Term Goals: Week 1:  OT Short Term Goal 1 (Week 1): Pt will consistently be oriented x3 with max cues OT Short Term Goal 2 (Week 1): Pt will sustain attention to functional task for 30 seconds with max cues OT Short Term Goal 3 (Week 1): Pt will stand for 30 seconds during functional activity with mod assist OT Short Term Goal 4 (Week 1): Pt will initiate and complete bathing with mod cues and min assist OT Short Term Goal 5 (Week 1): Pt will utilize LUE 50% of time during self-care task with min cues  Skilled Therapeutic Interventions/Progress Updates:    1:1 Pt asleep in bed when arrived but easily aroused. Pt ambulated to bathroom with steady A with more upright posture at midline today to the toilet. Pt performed toileting with steady A for 3/3 steps. Pt was continent of urine. Pt washed hands at sink in standing with max cues for sequence and task organization. Sat in w/c and engaged in self feeding her lunch. Pt required mod cuing to slow her pace and max cues to use spoon for intake of honey thick liquids. Pt ate 50% of chicken and 100% of mash potatoes and peaches. Pt able to read clock correctly with extra time but not oriented to place. Pt ambulated to gym with steady A and able to follow directional commands appropriately. Pt gathered to activities and carried them to the dayroom with focus on cognition (attention, alertness,and following through one step commands) . Pt engaged in putting together 24 piece puzzle with cues for each piece and wear it was to be assembled with extra time. Then participated in matching activity from a field of 12 (6 matches) pt able to complete task with more than reasonable amt of time in between matches. Pt returned to room and to bed.     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

## 2014-11-02 NOTE — Progress Notes (Signed)
[facility] PHYSICAL MEDICINE & REHABILITATION     PROGRESS NOTE    Subjective/Complaints: No new issues.   Objective: Vital Signs: Blood pressure 130/65, pulse 57, temperature 98.5 F (36.9 C), temperature source Oral, resp. rate 17, height  (1.626 m), weight 44.8 kg (98 lb 12.3 oz), SpO2 100 %. No results found.  Recent Labs  10/30/14 1310  WBC 4.1  HGB 10.3*  HCT 30.4*  PLT 231    Recent Labs  10/30/14 1310  NA 133*  K 4.3  CL 99  GLUCOSE 137*  BUN 19  CREATININE 0.58  CALCIUM 8.9   CBG (last 3)  No results for input(s): GLUCAP in the last 72 hours.  Wt Readings from Last 3 Encounters:  11/02/14 44.8 kg (98 lb 12.3 oz)  10/25/14 51.302 kg (113 lb 1.6 oz)  07/09/14 49.17 kg (108 lb 6.4 oz)    Physical Exam:  Constitutional:  69 year old right handed frail female. No acute distress HENT:  Head: Normocephalic.  Eyes: EOM are normal. Pupils are equal, round, and reactive to light.     Neck: Normal range of motion. Neck supple. No tracheal deviation present. No thyromegaly present.  Cardiovascular: Normal rate and regular rhythm.  No murmur heard. Respiratory: Effort normal and breath sounds normal. No respiratory distress. She has no wheezes. She has no rales.  GI: Soft. Bowel sounds are normal. She exhibits no distension. There is no tenderness. There is no rebound.  Musculoskeletal: She exhibits tenderness. She exhibits no edema.  She moves all extremities  Neurological: Coordination abnormal.  Patient is alert flat effect. Follows simple commands. Oriented to name and hospital---not time,reason. Moves all 4's fairly equally with inconsistent effort. Skin: Skin is warm and dry.  Psychiatric:  Remains confused, non-agitated  Assessment/Plan: 1. Functional deficits secondary to TBI which require 3+ hours per day of interdisciplinary therapy in a comprehensive inpatient rehab setting. Physiatrist is providing close team supervision and 24 hour  management of active medical problems listed below. Physiatrist and rehab team continue to assess barriers to discharge/monitor patient progress toward functional and medical goals.  FIM: FIM - Bathing Bathing Steps Patient Completed: Chest, Right Arm, Left Arm, Abdomen, Front perineal area, Right upper leg, Left upper leg, Right lower leg (including foot), Left lower leg (including foot) Bathing: 0: Activity did not occur  FIM - Upper Body Dressing/Undressing Upper body dressing/undressing steps patient completed: Thread/unthread right bra strap, Thread/unthread right sleeve of pullover shirt/dresss, Put head through opening of pull over shirt/dress, Thread/unthread left sleeve of pullover shirt/dress, Pull shirt over trunk Upper body dressing/undressing: 4: Min-Patient completed 75 plus % of tasks FIM - Lower Body Dressing/Undressing Lower body dressing/undressing steps patient completed: Thread/unthread right underwear leg, Thread/unthread left underwear leg, Pull underwear up/down, Don/Doff left sock, Don/Doff right sock, Thread/unthread right pants leg, Pull pants up/down Lower body dressing/undressing: 4: Min-Patient completed 75 plus % of tasks  FIM - Toileting Toileting steps completed by patient: Adjust clothing prior to toileting, Adjust clothing after toileting Toileting Assistive Devices: Grab bar or rail for support Toileting: 3: Mod-Patient completed 2 of 3 steps  FIM - Diplomatic Services operational officer Devices: Grab bars Toilet Transfers: 4-To toilet/BSC: Min A (steadying Pt. > 75%), 4-From toilet/BSC: Min A (steadying Pt. > 75%)  FIM - Bed/Chair Transfer Bed/Chair Transfer Assistive Devices: Arm rests, HOB elevated Bed/Chair Transfer: 4: Bed > Chair or W/C: Min A (steadying Pt. > 75%), 4: Chair or W/C > Bed: Min A (steadying Pt. >  75%), 4: Supine > Sit: Min A (steadying Pt. > 75%/lift 1 leg), 5: Sit > Supine: Supervision (verbal cues/safety issues)  FIM -  Locomotion: Wheelchair Locomotion: Wheelchair: 0: Activity did not occur FIM - Locomotion: Ambulation Locomotion: Ambulation Assistive Devices: Other (comment) (HHA) Ambulation/Gait Assistance: 4: Min assist Locomotion: Ambulation: 4: Travels 150 ft or more with minimal assistance (Pt.>75%)  Comprehension Comprehension Mode: Auditory Comprehension: 2-Understands basic 25 - 49% of the time/requires cueing 51 - 75% of the time  Expression Expression Mode: Verbal Expression: 5-Expresses basic 90% of the time/requires cueing < 10% of the time.  Social Interaction Social Interaction: 2-Interacts appropriately 25 - 49% of time - Needs frequent redirection.  Problem Solving Problem Solving: 1-Solves basic less than 25% of the time - needs direction nearly all the time or does not effectively solve problems and may need a restraint for safety  Memory Memory: 1-Recognizes or recalls less than 25% of the time/requires cueing greater than 75% of the time  Medical Problem List and Plan: 1. Functional deficits secondary to TBI after a fall with left frontal, left temporal bleed and right cerebellar contusion as well as right basal skull fractures 2. DVT Prophylaxis/Anticoagulation: SCDs. Monitor for any signs of DVT 3. Pain Management: Tylenol as needed 4. Mood/bipolar disorder/dementia: Celexa 20 mg daily, Seroquel 300 mg twice a day, Ativan every 4 hours as needed. Fairly substantial dementia at baseline  -observe for mood swings or alterations in arousal--have not seen that so far here 5. Neuropsych: This patient is not capable of making decisions on her own behalf. 6. Skin/Wound Care: Routine skin checks 7. Fluids/Electrolytes/Nutrition: encouarge PO, RD follow up as well 8. Seizure disorder. Keppra 500 mg twice a day. Monitor for any seizure activity 9. Dysphagia. Dysphagia- honey thick liquids/ D1. Monitor hydration  -continue HS IVF for now 10. Hypertension. Lasix 20 mg daily,  lisinopril 2.5 mg daily. Monitor with increased mobility  -recent labs normal.  11. Urinary frequency. Having large PVR's. DC enablex , continue Flomax 0.4 mg daily at bedtime. Continue check PVR's and cath as needed for volumes greater than 350cc  -Urine + 100k enterococcus: amoxil for 5 days 12.  Orhtostatic Hypotension---flomax and lisinopril stopped---  LOS (Days) 8 A FACE TO FACE EVALUATION WAS PERFORMED                                                               SWARTZ,ZACHARY T 11/02/2014 8:04 AM

## 2014-11-02 NOTE — Progress Notes (Signed)
Physical Therapy Session Note  Patient Details  Name: Sharon [name] MRN: [id number] Date of Birth: [birth year]  Today's Date: 11/02/2014 PT Individual Time: 1400-1430 PT Individual Time Calculation (min): 30 min   Short Term Goals: Week 2:  PT Short Term Goal 1 (Week 2): Patient will perform bed mobility with supervision 50% of time.  PT Short Term Goal 2 (Week 2): Patient will transfer bed <> wheelchair with consistent min A. PT Short Term Goal 3 (Week 2): Patient will demonstrate dynamic standing balance x 5 min with consistent min A. PT Short Term Goal 4 (Week 2): Patient will demonstrate sustained attention to functional task x 60 sec with max cues.  PT Short Term Goal 5 (Week 2): Patient will demonstrate functional problem solving for basic mobility task with max cues.   Skilled Therapeutic Interventions/Progress Updates:   Session focused on sustained attention, command following, and participation. Pt asleep in bed when arrived but easily aroused, required min A for supine > sit to initiate task. Pt required max encouragement to participate in therapy and constantly requested to return to bed or attempted to lie down on different surfaces throughout session. Pt ambulated to bathroom with steady A and attempted toileting with management of clothing but no void. Pt washed hands at sink in standing with max cues for sequencing. Patient ambulated to retrieve drink from refrigerator and carried to table in day room with supervision, improved midline orientation and standing balance noted compared to previous sessions. With increased time, patient consumed 4 fl oz of honey thick juice via tsp with max cues for self feeding. Patient stated repeatedly, "I don't want anymore," but would continue to drink juice. Patient disoriented to place and situation but demonstrated some awareness with initiation of asking "What's wrong with me?" Patient negotiated up/down 4 stairs using 1 rail and HHA with min A  and step to pattern and ambulated throughout rehab unit with close supervision. Attempted path finding back to room at end of session, patient unable to utilize signs with max assist to locate room but able to recall room number and find room once in correct hallway. Patient returned to bed and left sidelying with bed alarm on and 3 rails up, NT present.   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), sits without warning Restrictions Weight Bearing Restrictions: No Pain: Pain Assessment Pain Assessment: No/denies pain Locomotion : Ambulation Ambulation/Gait Assistance: 4: Min guard;5: Supervision   See FIM for current functional status  Therapy/Group: Individual Therapy  Kerney ElbeVarner, Elexia Friedt A 11/02/2014, 2:40 PM

## 2014-11-02 NOTE — Progress Notes (Signed)
INITIAL NUTRITION ASSESSMENT  Pt meets criteria for SEVERE MALNUTRITION in the context of acute illness/injury as evidenced by a 10% weight loss in 9 days and moderate muscle mass loss.  DOCUMENTATION CODES Per approved criteria  -Severe malnutrition in the context of acute illness or injury -Underweight   INTERVENTION: Provide Ensure Pudding po TID, each supplement provides 170 kcal and 4 grams of protein.  Provide Magic cup TID between meals, each supplement provides 290 kcal and 9 grams of protein  Provide Ensure Enlive po BID PRN (thickened to honey thick), each supplement provides 350 kcal and 20 grams of protein.  Encourage adequate PO intake.  NUTRITION DIAGNOSIS: Malnutrition related to inadequate oral intake as evidenced by weight loss and moderate muscle mass loss.   Goal: Pt to meet >/= 90% of their estimated nutrition needs   Monitor:  PO intake, weight trends, labs, I/O's  Reason for Assessment: MST  69 y.o. 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 Presented 10/14/2014 after a fall with left frontal, L temporal bleed &R cerebellar contusion, R basal skull fxs,and post traumatic seizures.  Pt is currently on a dysphagia 1 diet with honey thick liquids. Meal completion has been varied from 10-100%. During time of visit, pt was nonverbal and did not answer to questions asked. Unable to obtain nutrition hx. Spoke with RN, pt has not been taking her Ensure Shakes well due to poor acceptance at most times, however does favor Ensure Pudding. RD to modify orders. RN reports pt vomited up her eggs at breakfast this morning, however Ensure pudding was tolerated upon consumption. Per Epic weight records, pt with a 10% weight loss in 9 days. RD to continue to monitor.   Nutrition Focused Physical Exam:  Subcutaneous Fat:  Orbital  Region: N/A Upper Arm Region: WNL Thoracic and Lumbar Region: WNL  Muscle:  Temple Region: Mild to moderate depletion Clavicle Bone Region: Moderate depletion Clavicle and Acromion Bone Region: Mild to moderate depletion Scapular Bone Region: N/A Dorsal Hand: N/A Patellar Region: N/A Unable to assess LE due to compression stockings Anterior Thigh Region: N/A Posterior Calf Region: N/A  Edema: none  Labs and medications reviewed.  Height: Ht Readings from Last 1 Encounters:  10/25/14  (1.626 m)    Weight: Wt Readings from Last 1 Encounters:  11/02/14 98 lb 12.3 oz (44.8 kg)  10/26/14 109 lbs  Ideal Body Weight: 120 lbs  % Ideal Body Weight: 82%  Wt Readings from Last 10 Encounters:  11/02/14 98 lb 12.3 oz (44.8 kg)  10/25/14 113 lb 1.6 oz (51.302 kg)  07/09/14 108 lb 6.4 oz (49.17 kg)  06/03/14 112 lb 4.8 oz (50.939 kg)    Usual Body Weight: 113 lbs  % Usual Body Weight: 87%  BMI:  Body mass index is 16.94 kg/(m^2). Underweight  Estimated Nutritional Needs: Kcal: 1500-1700 Protein: 65-75 grams Fluid: >/= 1.5 L/day  Skin: Intact  Diet Order: DIET - DYS 1 Room service appropriate?: Yes; Fluid consistency:: Honey Thick  EDUCATION NEEDS: -No education needs identified at this time   Intake/Output Summary (Last 24 hours) at 11/02/14 1558 Last data filed at 11/02/14 1330  Gross per 24 hour  Intake    360 ml  Output    800 ml  Net   -440 ml    Last BM: 4/11  Labs:   Recent Labs Lab 10/30/14 1310  NA 133*  K 4.3  CL 99  CO2 23  BUN 19  CREATININE 0.58  CALCIUM 8.9  GLUCOSE 137*    CBG (last 3)  No results for input(s): GLUCAP in the last 72 hours.  Scheduled Meds: . amoxicillin  250 mg Oral 3 times per day  . antiseptic oral rinse  7 mL Mouth Rinse BID  . cholecalciferol  1,000 Units Oral QHS  . citalopram  20 mg Oral QHS  . feeding supplement (ENSURE ENLIVE)  237 mL Oral BID BM  . ferrous sulfate  325 mg Oral BID WC  .  furosemide  20 mg Oral Daily  . levETIRAcetam  500 mg Oral BID  . pantoprazole  40 mg Oral Daily  . QUEtiapine  150 mg Oral BID  . cyanocobalamin  1,000 mcg Oral QHS  . zinc sulfate  220 mg Oral Daily    Continuous Infusions: . sodium chloride Stopped (11/02/14 0846)    Past Medical History  Diagnosis Date  . Bipolar 1 disorder   . Dementia   . Other pancytopenia 06/03/2014  . Stroke     Past Surgical History  Procedure Laterality Date  . Cholecystectomy      Sharon NiemannStephanie La, MS, RD, LDN Pager # [phone] After hours/ weekend pager # [phone]

## 2014-11-02 NOTE — Progress Notes (Signed)
Speech Language Pathology Daily Session Note  Patient Details  Name: Sharon [name] MRN: [id number] Date of Birth: 09-01-1945  Today's Date: 11/02/2014 SLP Individual Time: 1000-1100 SLP Individual Time Calculation (min): 60 min  Short Term Goals: Week 2: SLP Short Term Goal 1 (Week 2): Patient will orient to place, time and situation with use of external aids with Mod A question and verbal cues.  SLP Short Term Goal 2 (Week 2): Patient will demonstrate functional problem solving for basic and familiar self-care tasks with Max A verbal cues.  SLP Short Term Goal 3 (Week 2): Patient will sustain attention to a functional task for 5 minutes with Mod A verbal cues for redirection.  SLP Short Term Goal 4 (Week 2): Patient will consume current diet with minimal overt s/s of aspiration with Mod A verbal cues for use of swallowing compensatory strategies.  SLP Short Term Goal 5 (Week 2): Patient will utilize speech intelligibility strategies with Max A verbal cues with 75% of observable opportunities to increase intelligibility to 75% at the phrase level.   Skilled Therapeutic Interventions: Skilled treatment session focused on cognitive and dysphagia goals. Patient received upright in w/c from nursing station, alert and agreeable to participate in therapy. Patient reported needing to use restroom, therefore patient returned to room to complete self-care task before student propelled patient to speech office to facilitate attention. Student facilitated session with PO trials of honey-thick liquid via teaspoon which patient consumed with Mod A verbal and tactile cues for use of swallowing strategies and minimal overt s/s of aspiration characterized by delayed throat clear x1. Patient oriented to situation with Mod A question cues and utilized external aid to orient to time with Min A visual and question cues, and required Min A question cues to facilitate intermittent carry-over of information throughout  session. Student further facilitated session with Min A verbal and visual cues for sustained attention to functional organization task for ~10 minutes in a quiet environment, with patient completing simple 1-step directions with 100% accuracy, mildly complex 1-step directions with 100% accuracy, and simple 2-step directions with 50% accuracy. Patient furthermore independently able to sequence numerically and monitor errors when completing basic calendar organization task. Patient with markedly more alert affect this session and increased verbal utterances, with overall intelligibility of ~90% at the phrase level (while wearing dentures). Patient returned to room and transferred to bed; patient left with 3 siderails up, bed alarm on, and all needs within reach. Continue with current plan of care.   FIM:  Comprehension Comprehension Mode: Auditory Comprehension: 3-Understands basic 50 - 74% of the time/requires cueing 25 - 50%  of the time Expression Expression Mode: Verbal Expression: 3-Expresses basic 50 - 74% of the time/requires cueing 25 - 50% of the time. Needs to repeat parts of sentences. Social Interaction Social Interaction: 2-Interacts appropriately 25 - 49% of time - Needs frequent redirection. Problem Solving Problem Solving: 1-Solves basic less than 25% of the time - needs direction nearly all the time or does not effectively solve problems and may need a restraint for safety Memory Memory: 2-Recognizes or recalls 25 - 49% of the time/requires cueing 51 - 75% of the time FIM - Eating Eating Activity: 5: Needs verbal cues/supervision;6: Modified consistency diet: (comment);6: Assistive device: dentures  Pain Pain Assessment Pain Assessment: No/denies pain  Therapy/Group: Individual Therapy  [name] 11/02/2014, 12:52 PM

## 2014-11-02 NOTE — Progress Notes (Signed)
Occupational Therapy Session Note  Patient Details  Name: Sharon [name] MRN: [id number] Date of Birth: 15-Jul-1946  Today's Date: 11/02/2014 OT Individual Time: 0900-1000 OT Individual Time Calculation (min): 60 min    Short Term Goals: Week 1:  OT Short Term Goal 1 (Week 1): Pt will consistently be oriented x3 with max cues OT Short Term Goal 2 (Week 1): Pt will sustain attention to functional task for 30 seconds with max cues OT Short Term Goal 3 (Week 1): Pt will stand for 30 seconds during functional activity with mod assist OT Short Term Goal 4 (Week 1): Pt will initiate and complete bathing with mod cues and min assist OT Short Term Goal 5 (Week 1): Pt will utilize LUE 50% of time during self-care task with min cues  Skilled Therapeutic Interventions/Progress Updates:    Pt resting in bed upon arrival and greeted therapist appropriately.  Pt initially only oriented to city but oriented to date with visual cues later in the session.  Pt required max encouragement to participate in therapy and constantly requested to return to bed throughout session.  Pt required min verbal cues for task initiation.  Pt uses BUE to perform tasks.  Pt stated she wanted something to drink but required min encouragement and assist with using teaspoon to drink honey thick liquids.  Although patient mobilizing better and exhibits improved standing balance and functional ambulation, patient continues to require max encouragement to participate in therapy.  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), sits without warning Restrictions Weight Bearing Restrictions: No   Pain: Pain Assessment Pain Assessment: No/denies pain   See FIM for current functional status  Therapy/Group: Individual Therapy  Rich BraveLanier, [place] Hoback Chappell 11/02/2014, 11:01 AM

## 2014-11-02 NOTE — Progress Notes (Signed)
Occupational Therapy Session Note  Patient Details  Name: Sharon [name] MRN: [id number] Date of Birth: 07-21-1946  Today's Date: 11/02/2014 OT Individual Time: 1500-1530 OT Individual Time Calculation (min): 30 min    Skilled Therapeutic Interventions/Progress Updates:    Pt needed max demonstrational cueing to participate in session.  She kept eyes closed at beginning of session and would acknowledge therapist but would not initiate.  Therapist had to provide max assist for pt to transition to sitting and then block her from laying back down.  She was able to eventually donn her shoes with max instructional cueing and increased time.  Had her ambulate down to the ADL apartment with min hand held assist.  Worked on having her to identify items presented to her she was able to name 3/3 items correctly such as bowel, and paper plate.  Therapist then tried to incorporate visual scanning and cognitive processing by having her locate the labels on the cabinets and place four items correctly where they needed to go.  Max questioning cueing needed to place all of the items as pt demonstrated moderate internal distraction and was unable to correlate the label with the item.  Pt perseverating on laying down and attempted to leave the task and go sit down without any verbal indication to therapist.  Finally, after pt concluded task had her walk back to her room with max instructional cueing.  She was able to find her room once she ambulated down her hallway but could not verbally identify how she knew it was her room.  Pt left in supine with call bell within reach and bed alarm set.   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), sits without warning Restrictions Weight Bearing Restrictions: No  Pain: Pain Assessment Pain Assessment: No/denies pain ADL: See FIM for current functional status  Therapy/Group:  Individual Therapy  Janeka Libman OTR/L 11/02/2014, 4:57 PM

## 2014-11-03 ENCOUNTER — Inpatient Hospital Stay (HOSPITAL_COMMUNITY): Payer: Medicare Other | Admitting: Speech Pathology

## 2014-11-03 ENCOUNTER — Inpatient Hospital Stay (HOSPITAL_COMMUNITY): Payer: Medicare Other

## 2014-11-03 ENCOUNTER — Inpatient Hospital Stay (HOSPITAL_COMMUNITY): Payer: Medicare Other | Admitting: Physical Therapy

## 2014-11-03 DIAGNOSIS — R1314 Dysphagia, pharyngoesophageal phase: Secondary | ICD-10-CM

## 2014-11-03 DIAGNOSIS — R350 Frequency of micturition: Secondary | ICD-10-CM

## 2014-11-03 MED ORDER — ENSURE PUDDING PO PUDG
1.0000 | Freq: Three times a day (TID) | ORAL | Status: DC
  Administered 2014-11-03 – 2014-11-16 (×24): 1 via ORAL

## 2014-11-03 MED ORDER — ENSURE ENLIVE PO LIQD: 237.0000 mL | Freq: Two times a day (BID) | ORAL | Status: DC | PRN

## 2014-11-03 NOTE — Progress Notes (Signed)
Physical Therapy Session Note  Patient Details  Name: Sharon [name] MRN: [id number] Date of Birth: 11/05/1945  Today's Date: 11/03/2014 PT Individual Time: 1415-1445 PT Individual Time Calculation (min): 30 min   Short Term Goals: Week 2:  PT Short Term Goal 1 (Week 2): Patient will perform bed mobility with supervision 50% of time.  PT Short Term Goal 2 (Week 2): Patient will transfer bed <> wheelchair with consistent min A. PT Short Term Goal 3 (Week 2): Patient will demonstrate dynamic standing balance x 5 min with consistent min A. PT Short Term Goal 4 (Week 2): Patient will demonstrate sustained attention to functional task x 60 sec with max cues.  PT Short Term Goal 5 (Week 2): Patient will demonstrate functional problem solving for basic mobility task with max cues.   Skilled Therapeutic Interventions/Progress Updates:   Session focused on sustained attention, command following, dynamic standing balance, and activity tolerance. Patient received seated in wheelchair at RN station, provided wash cloth and appropriately washed face due to food residue around mouth. With max coaxing, patient got out of wheelchair with min HHA and ambulated throughout rehab unit with close supervision, including opening doors and carrying objects in hand. In ADL apartment, patient engaged in task of sorting silverware into silverware drawer, initially therapist picking up each piece individually for patient to sort progressed to patient initiating sorting with silverware on countertop with 100% accuracy. Patient given item to locate in cabinets but unable to utilize labels as she opened every cabinet door until she found the correct item. Patient retrieved honey thick juice from refrigerator with mod cues and stopped at chairs by elevator despite cues to locate day room. Patient required max cues to consume 25% of 4 fl oz juice using tsp. Patient given task to locate recycling for can but was unable to sustain  attention to task with max multimodal assist. Patient continuously reported that she did not feel well, was tired, and wanted to return to bed throughout session. Patient located her room at end of session but was unable to verbalize how she knew it was the correct room, stating, "I just know." Patient left sidelying in bed with bed alarm on and 3 rails up, call bell within reach.     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

## 2014-11-03 NOTE — Progress Notes (Signed)
Speech Language Pathology Daily Session Note  Patient Details  Name: Sharon [name] MRN: [id number] Date of Birth: [birth year]  Today's Date: 11/03/2014 SLP Individual Time: 1300-1400 SLP Individual Time Calculation (min): 60 min  Short Term Goals: Week 2: SLP Short Term Goal 1 (Week 2): Patient will orient to place, time and situation with use of external aids with Mod A question and verbal cues.  SLP Short Term Goal 2 (Week 2): Patient will demonstrate functional problem solving for basic and familiar self-care tasks with Max A verbal cues.  SLP Short Term Goal 3 (Week 2): Patient will sustain attention to a functional task for 5 minutes with Mod A verbal cues for redirection.  SLP Short Term Goal 4 (Week 2): Patient will consume current diet with minimal overt s/s of aspiration with Mod A verbal cues for use of swallowing compensatory strategies.  SLP Short Term Goal 5 (Week 2): Patient will utilize speech intelligibility strategies with Max A verbal cues with 75% of observable opportunities to increase intelligibility to 75% at the phrase level.   Skilled Therapeutic Interventions: Skilled treatment session focused on cognitive and dysphagia goals. Upon arrival, patient was supine and asleep in bed and required Mod encouragement for arousal and subsequent participation in therapy. Patient reporting pain in stomach; RN aware. Student facilitated session by providing skilled observation of patient consumption of lunchtime meal of honey-thick liquids via teaspoon and Dys. 1 textures, which patient consumed without overt s/s of aspiration and required Mod A verbal and tactile cues for use of swallow strategies. Patient required Max A question cues for patient recall of new, daily information, Mod A verbal and question cues cues for selective attention to functional task for ~90 seconds, and Min A visual and question cues for patient orientation to time and situation. During the aforementioned task  patient followed simple 1-step commands with 100% accuracy, complex 1-step commands with 75% accuracy, and simple 2-step commands with 75% accuracy. Student further facilitated session with simple numerical card sorting activity, for which patient required Min A verbal cues for selective attention for ~2 minutes in a mildly distracting environment and Min A fading to Supervision A verbal cues for initiation and completion of basic 2-step task associated with activity. Patient left at nurse's station upright in wheelchair with quick-release belt donned. Continue with current plan of care.   FIM:  Comprehension Comprehension Mode: Auditory Comprehension: 3-Understands basic 50 - 74% of the time/requires cueing 25 - 50%  of the time Expression Expression Mode: Verbal Expression: 3-Expresses basic 50 - 74% of the time/requires cueing 25 - 50% of the time. Needs to repeat parts of sentences. Social Interaction Social Interaction: 2-Interacts appropriately 25 - 49% of time - Needs frequent redirection. Problem Solving Problem Solving: 1-Solves basic less than 25% of the time - needs direction nearly all the time or does not effectively solve problems and may need a restraint for safety Memory Memory: 2-Recognizes or recalls 25 - 49% of the time/requires cueing 51 - 75% of the time FIM - Eating Eating Activity: 5: Needs verbal cues/supervision;6: Modified consistency diet: (comment);6: Assistive device: dentures  Pain Pain Assessment Pain Assessment: No/denies pain  Therapy/Group: Individual Therapy  Tacey RuizKatherine Reis Pienta 11/03/2014, 5:56 PM

## 2014-11-03 NOTE — Progress Notes (Signed)
[place] PHYSICAL MEDICINE & REHABILITATION     PROGRESS NOTE    Subjective/Complaints: No new issues this am. Uneventful night. ROS limited by cognition  Objective: Vital Signs: Blood pressure 129/44, pulse 79, temperature 98.3 F (36.8 C), temperature source Oral, resp. rate 18, height  (1.626 m), weight 44.9 kg (98 lb 15.8 oz), SpO2 99 %. No results found. No results for input(s): WBC, HGB, HCT, PLT in the last 72 hours. No results for input(s): NA, K, CL, GLUCOSE, BUN, CREATININE, CALCIUM in the last 72 hours.  Invalid input(s): CO CBG (last 3)  No results for input(s): GLUCAP in the last 72 hours.  Wt Readings from Last 3 Encounters:  11/03/14 44.9 kg (98 lb 15.8 oz)  10/25/14 51.302 kg (113 lb 1.6 oz)  07/09/14 49.17 kg (108 lb 6.4 oz)    Physical Exam:  Constitutional:  69 year old right handed frail female. No acute distress HENT:  Head: Normocephalic.  Eyes: EOM are normal. Pupils are equal, round, and reactive to light.     Neck: Normal range of motion. Neck supple. No tracheal deviation present. No thyromegaly present.  Cardiovascular: Normal rate and regular rhythm.  No murmur heard. Respiratory: Effort normal and breath sounds normal. No respiratory distress. She has no wheezes. She has no rales.  GI: Soft. Bowel sounds are normal. She exhibits no distension. There is no tenderness. There is no rebound.  Musculoskeletal: She exhibits tenderness. She exhibits no edema.  She moves all extremities  Neurological: Coordination abnormal.  Patient is alert flat effect. Follows simple commands. Oriented to name and hospital---not time,reason. Moves all 4's fairly equally with inconsistent effort. Skin: Skin is warm and dry.  Psychiatric:  Remains confused, non-agitated  Assessment/Plan: 1. Functional deficits secondary to TBI which require 3+ hours per day of interdisciplinary therapy in a comprehensive inpatient rehab setting. Physiatrist is providing  close team supervision and 24 hour management of active medical problems listed below. Physiatrist and rehab team continue to assess barriers to discharge/monitor patient progress toward functional and medical goals.  FIM: FIM - Bathing Bathing Steps Patient Completed: Chest, Right Arm, Left Arm, Front perineal area, Buttocks, Right upper leg, Left upper leg, Right lower leg (including foot), Left lower leg (including foot), Abdomen Bathing: 4: Steadying assist  FIM - Upper Body Dressing/Undressing Upper body dressing/undressing steps patient completed: Thread/unthread right bra strap, Thread/unthread right sleeve of pullover shirt/dresss, Put head through opening of pull over shirt/dress, Thread/unthread left sleeve of pullover shirt/dress, Pull shirt over trunk, Hook/unhook bra, Thread/unthread left bra strap Upper body dressing/undressing: 5: Supervision: Safety issues/verbal cues FIM - Lower Body Dressing/Undressing Lower body dressing/undressing steps patient completed: Thread/unthread right underwear leg, Thread/unthread left underwear leg, Pull underwear up/down, Don/Doff left sock, Don/Doff right sock, Thread/unthread right pants leg, Pull pants up/down, Thread/unthread left pants leg, Don/Doff right shoe, Don/Doff left shoe Lower body dressing/undressing: 4: Steadying Assist  FIM - Toileting Toileting steps completed by patient: Adjust clothing prior to toileting, Adjust clothing after toileting Toileting Assistive Devices: Grab bar or rail for support Toileting: 3: Mod-Patient completed 2 of 3 steps  FIM - Diplomatic Services operational officer Devices: Grab bars Toilet Transfers: 4-To toilet/BSC: Min A (steadying Pt. > 75%), 4-From toilet/BSC: Min A (steadying Pt. > 75%)  FIM - Bed/Chair Transfer Bed/Chair Transfer Assistive Devices: Arm rests, HOB elevated Bed/Chair Transfer: 4: Bed > Chair or W/C: Min A (steadying Pt. > 75%), 4: Chair or W/C > Bed: Min A (steadying Pt. >  75%),  4: Supine > Sit: Min A (steadying Pt. > 75%/lift 1 leg), 5: Sit > Supine: Supervision (verbal cues/safety issues)  FIM - Locomotion: Wheelchair Locomotion: Wheelchair: 0: Activity did not occur FIM - Locomotion: Ambulation Locomotion: Ambulation Assistive Devices: Other (comment) (none) Ambulation/Gait Assistance: 4: Min guard, 5: Supervision Locomotion: Ambulation: 4: Travels 150 ft or more with minimal assistance (Pt.>75%)  Comprehension Comprehension Mode: Auditory Comprehension: 3-Understands basic 50 - 74% of the time/requires cueing 25 - 50%  of the time  Expression Expression Mode: Verbal Expression: 3-Expresses basic 50 - 74% of the time/requires cueing 25 - 50% of the time. Needs to repeat parts of sentences.  Social Interaction Social Interaction: 2-Interacts appropriately 25 - 49% of time - Needs frequent redirection.  Problem Solving Problem Solving: 1-Solves basic less than 25% of the time - needs direction nearly all the time or does not effectively solve problems and may need a restraint for safety  Memory Memory: 2-Recognizes or recalls 25 - 49% of the time/requires cueing 51 - 75% of the time  Medical Problem List and Plan: 1. Functional deficits secondary to TBI after a fall with left frontal, left temporal bleed and right cerebellar contusion as well as right basal skull fractures 2. DVT Prophylaxis/Anticoagulation: SCDs. Monitor for any signs of DVT 3. Pain Management: Tylenol as needed 4. Mood/bipolar disorder/dementia: Celexa 20 mg daily, Seroquel 300 mg twice a day, Ativan every 4 hours as needed. Fairly substantial dementia at baseline  -observe for mood swings or alterations in arousal--have not seen that so far here 5. Neuropsych: This patient is not capable of making decisions on her own behalf. 6. Skin/Wound Care: Routine skin checks 7. Fluids/Electrolytes/Nutrition: encouarge PO, RD follow up as well 8. Seizure disorder. Keppra 500 mg twice a  day. Monitor for any seizure activity 9. Dysphagia. Dysphagia- honey thick liquids/ D1. Monitor hydration  -continue HS IVF for now pending advancement of diet 10. Hypertension. Lasix 20 mg daily, lisinopril 2.5 mg daily. Monitor with increased mobility  -recent labs normal.  11. Urinary frequency. Having persistent elevated PVR's (perhaps a little better). DC'ed enablex. continue Flomax 0.4 mg daily at bedtime. Continue I/O cath prn -Urine + 100k enterococcus: amoxil for 5 days 12.  Orthostatic Hypotension---flomax and lisinopril stopped---  LOS (Days) 9 A FACE TO FACE EVALUATION WAS PERFORMED                                                               Ashleigh Arya T 11/03/2014 8:16 AM

## 2014-11-03 NOTE — Progress Notes (Signed)
Occupational Therapy Weekly Progress Note  Patient Details  Name: Sharon [name] MRN: [id number] Date of Birth: 14-Aug-1945  Beginning of progress report period: October 26, 2014 End of progress report period: November 03, 2014  Patient has met 4 of 5 short term goals.  Pt has made steady progress with BADLs since admission.  Pt completes all BADLs with steady A for standing and min verbal cues for task initiation and sequencing.  Pt requires mod encouragement to participate and frequently requests to return to bed.  Pt is oriented to city but requires total A for hospital and situation.  Pt requires min verbal cues to use visual cues for date.    Patient continues to demonstrate the following deficits: decreased strength, decreased activity tolerance, decreased sustained attention, decreased safety, decreased balance strategies, decreased standing balance, decreased awareness, decreased memory, decreased problem solving, decreased postural control, decreased initiation, delayed processing and therefore will continue to benefit from skilled OT intervention to enhance overall performance with BADLs, activity tolerance, balance, cognitive remediation, safety, postural control.  Patient progressing toward long term goals..  Continue plan of care.  OT Short Term Goals Week 1:  OT Short Term Goal 1 (Week 1): Pt will consistently be oriented x3 with max cues OT Short Term Goal 1 - Progress (Week 1): Progressing toward goal OT Short Term Goal 2 (Week 1): Pt will sustain attention to functional task for 30 seconds with max cues OT Short Term Goal 2 - Progress (Week 1): Met OT Short Term Goal 3 (Week 1): Pt will stand for 30 seconds during functional activity with mod assist OT Short Term Goal 3 - Progress (Week 1): Met OT Short Term Goal 4 (Week 1): Pt will initiate and complete bathing with mod cues and min assist OT Short Term Goal 4 - Progress (Week 1): Met OT Short Term Goal 5 (Week 1): Pt will utilize  LUE 50% of time during self-care task with min cues OT Short Term Goal 5 - Progress (Week 1): Met Week 2:  OT Short Term Goal 1 (Week 2): STG=LTG  Skilled Therapeutic Interventions/Progress Updates:      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

## 2014-11-03 NOTE — Progress Notes (Signed)
Occupational Therapy Session Note  Patient Details  Name: Sharon [name] MRN: [id number] Date of Birth: 1945/10/31  Today's Date: 11/03/2014 OT Individual Time: 0900-1000 OT Individual Time Calculation (min): 60 min    Short Term Goals: Week 1:  OT Short Term Goal 1 (Week 1): Pt will consistently be oriented x3 with max cues OT Short Term Goal 2 (Week 1): Pt will sustain attention to functional task for 30 seconds with max cues OT Short Term Goal 3 (Week 1): Pt will stand for 30 seconds during functional activity with mod assist OT Short Term Goal 4 (Week 1): Pt will initiate and complete bathing with mod cues and min assist OT Short Term Goal 5 (Week 1): Pt will utilize LUE 50% of time during self-care task with min cues  Skilled Therapeutic Interventions/Progress Updates:    Pt resting in w/c upon arrival and initially agreeable to therapy.  Pt repeatedly declined bathing at shower level but agreed to bathing at sink.  Pt completed bathing and dressing tasks with sit<>stand from w/c at sink.  Pt abruptly stood up and began walking toward bathroom stating that she needed to use toilet.  Pt requires steady A with standing balance this morning.While sitting on toilet pt became nauseous and vomited.  Pt stated she felt sick.  RN notified.  Pt completed dressing tasks and initially agreed to continued engagement in therapy.  Pt continued to c/o nausea and requested to return to bed at end of session.  Focus on activity tolerance, sit<>stand, standing balance, toilet transfers, and toileting.    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

## 2014-11-03 NOTE — Progress Notes (Signed)
Physical Therapy Session Note  Patient Details  Name: Sharon [name] MRN: [id number] Date of Birth: [birth year]  Today's Date: 11/03/2014 PT Individual Time: 1530-1630 PT Individual Time Calculation (min): 60 min   Short Term Goals: Week 2:  PT Short Term Goal 1 (Week 2): Patient will perform bed mobility with supervision 50% of time.  PT Short Term Goal 2 (Week 2): Patient will transfer bed <> wheelchair with consistent min A. PT Short Term Goal 3 (Week 2): Patient will demonstrate dynamic standing balance x 5 min with consistent min A. PT Short Term Goal 4 (Week 2): Patient will demonstrate sustained attention to functional task x 60 sec with max cues.  PT Short Term Goal 5 (Week 2): Patient will demonstrate functional problem solving for basic mobility task with max cues.   Skilled Therapeutic Interventions/Progress Updates:    Gait Training: PT instructs pt in ambulation with HHA x 300' on longest bout req up to min A on level surface in a controlled environment. PT instructs pt in ascending/descending 12 stairs with B rails req CGA for safety.  PT instructs pt in pathfinding activity, focusing on dual tasking of reading signs by doors and trying to find her own room. Pt is able to complete one step of reading the signs, but unable to interpret if sign number is ascending/descending and unable to relate it to her own room number due to inability to consistently recall room number.   Therapeutic Activity: Pt received in bed, initially refusing PT, but with max encouragement participates. Pt req max A for supine to sit on first rep, then min A on second rep.  PT instructs pt in cognitive remediation activity with goal of attention to task, short term memory recall after leaning Connect 4 game - pt played 3 games with PT and unable to block PT from connecting 4, despite verbal cues to examine the "board".  Pt req single step sequencing cues to unlock game cupboard and place game in appropriate  spot after cleaning up.  Pt reports she is thirst and so PT sits pt upright in chair and follows SLP handout taped above bed re: slow pace, small bites, and thickened liquids with pt self feeding, but req cues/supervision to slow down pace - pt eats one pudding snack cup and the remainder (~ half cup) of thickened juice.    Pt continues to be fatigued and immediately goes to sit/lie down whenever a conforming surface appears. With encouragement, pt is able to participate in cognitive remediation and functional activities. Husband enters room at last session and reports pt's baseline is intermittently falling, but this last fall was the worst. Husband reports pt is in a manic state because her bipolar medication has been cut back from what is normal for her. Continue per PT POC.   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

## 2014-11-04 ENCOUNTER — Inpatient Hospital Stay (HOSPITAL_COMMUNITY): Payer: Medicare Other

## 2014-11-04 ENCOUNTER — Inpatient Hospital Stay (HOSPITAL_COMMUNITY): Payer: Medicare Other | Admitting: Speech Pathology

## 2014-11-04 ENCOUNTER — Inpatient Hospital Stay (HOSPITAL_COMMUNITY): Payer: Medicare Other | Admitting: Physical Therapy

## 2014-11-04 DIAGNOSIS — R1314 Dysphagia, pharyngoesophageal phase: Secondary | ICD-10-CM

## 2014-11-04 MED ORDER — BETHANECHOL CHLORIDE 10 MG PO TABS
10.0000 mg | ORAL_TABLET | Freq: Three times a day (TID) | ORAL | Status: DC
Start: 2014-11-04 — End: 2014-11-05
  Administered 2014-11-04 (×3): 10 mg via ORAL
  Filled 2014-11-04 (×10): qty 1

## 2014-11-04 NOTE — Progress Notes (Signed)
Social Work Patient ID: Sharon [name], female   DOB: 06/09/1946, 69 y.o.   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  Urine retention  11/03/2014   .  Dysphagia, pharyngoesophageal phase  11/03/2014   .  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  Frontal lobe contusion     .  Hypoxemia  10/21/2014   .  Pulmonary edema     .  Intracerebral hemorrhage  10/20/2014   .  Intracranial hemorrhage  10/14/2014   .  Other pancytopenia  06/03/2014     Expected Discharge Date: Expected Discharge Date: 11/09/14  Team Members Present: Physician leading conference: Dr. Faith RogueZachary Swartz Social Worker Present: Amada JupiterLucy Presleigh Feldstein, LCSW Nurse Present: Carlean PurlMaryann Barbour, RN PT Present: Bayard Huggerebecca Varner, PT OT Present: Scherrie NovemberKayla Perkinson, OT SLP Present: Feliberto Gottronourtney Payne, SLP Other (Discipline and Name): Ottie GlazierBarbara Boyette, RN [facility]) PPS Coordinator present : Tora DuckMarie Noel, RN, CRRN        Current Status/Progress  Goal  Weekly Team Focus   Medical     SLEEPING FAIRLY WELL, still with i and o caths. oral intake issues  improve intake nutrition  nurition, swallowing   Bowel/Bladder     Incontinent of bowel. I&O cathing every  8 hours.   To empty bladder without use of I&O cathing.   Timed toileting   Swallow/Nutrition/ Hydration     Dys. 1 textures with honey-thick liquids via teaspoon; Max A for use of swallow strategies  Mod A for use of swallow strategies   increase use of swallow strategies, tolerance of current diet with increased PO intake, possible trials of nectar-thick liquids    ADL's     decreased activity tolerance/arousal; min/mod A overall   min assist overall and supervision UB self-care   attention, initiation, cognitive remediation, functional transfers, standing balance, activity tolerance   Mobility     overall min A with functional mobility but can require +2 assist for safety with fluctuating status throughout day, fatigues quickly   S-min A  attention, problem solving, orientation, initiation, arousal, standing balance, NMR, functional mobility, activity tolerance, pt/family education   Communication     Mod A for 75% intelligibility at word level   Min A at phrase level  increase overall verbal expression, wearing dentures during therapy   Safety/Cognition/ Behavioral Observations    Mod-Max A  overall Min A with Mod A for attention   arousal, sustained attention, initiation, functional, familiar problem-solving, family education   Pain     No complaints of pain  Patient will remain at or below  a 3   Continue to monitor pain every 4 hours and prn   Skin     no current skin breakdown  patient will remain free from skin breakdown while in rehab.    Continue to monitor skin every shift and prn    Rehab Goals Patient on target to meet rehab goals: Yes *See Care Plan and progress notes for long and short-term goals.    Barriers to Discharge:  see prior     Possible Resolutions to Barriers:   continued NMR, cognitive perceptual rx      Discharge Planning/Teaching Needs:   home with husband to provide/ arrange 24/7 assistance        Team Discussion:    Still requiring I/O caths; IVF @  hs; poor po and UTI.  Supervision with mobility overall but does present with posterior lean.  Some overall improvement.  Need to repeat MBS as still on honey thick liquids.  Need to have husband in for education.  SW reports husband planning private duty but also to offer option of Adult Day Care   Revisions to Treatment Plan:    none    Continued Need for Acute Rehabilitation Level of Care: The patient requires daily medical management by a physician with specialized training in physical medicine and rehabilitation for the following conditions: Daily direction of a multidisciplinary physical rehabilitation program to ensure safe treatment while eliciting the highest outcome that is of practical value to the patient.: Yes Daily medical management of patient stability for increased activity during participation in an intensive rehabilitation regime.: Yes Daily analysis of laboratory values and/or radiology reports with any subsequent need for medication adjustment of medical intervention for : Neurological problems;Other  Mazi Schuff 11/04/2014, 3:19 PM

## 2014-11-04 NOTE — Progress Notes (Signed)
Social Work Patient ID: Sharon [name], female   DOB: 26-Sep-1945, 69 y.o.   MRN: [id number]   Met with pt and husband this afternoon to review team conference.  Husband aware of target dc 4/19 with supervision goals overall.  Discussed concerns of limited improvement overall, still with poor po and needing I/O caths.  Husband questions if she will be "ready by next week.. I thought it would be the end of next week at the earliest!"  Explaining to husband that we will continue to monitor her readiness for d/c, however, her need for 24/7 care is not expected to change.  He understands and reports that he still plans to take her home and is going to "look into getting some help."  I did provide him with list of private duty care agencies and discussed [place] programs.  He is not interested in the day programs.  He does not wish to pursue SNF care at this point.  "I need to try an do this at home for a little bit longer."   Husband is agreed to come in tomorrow afternoon to participate in ST and PT sessions.  I will follow up with him as well.  Continue to follow.  Annistyn Depass, LCSW

## 2014-11-04 NOTE — Progress Notes (Signed)
[facility] PHYSICAL MEDICINE & REHABILITATION     PROGRESS NOTE    Subjective/Complaints: No new issues this am. Uneventful night. ROS limited by cognition  Objective: Vital Signs: Blood pressure 136/67, pulse 65, temperature 98.2 F (36.8 C), temperature source Oral, resp. rate 18, height  (1.626 m), weight 44.8 kg (98 lb 12.3 oz), SpO2 100 %. No results found. No results for input(s): WBC, HGB, HCT, PLT in the last 72 hours. No results for input(s): NA, K, CL, GLUCOSE, BUN, CREATININE, CALCIUM in the last 72 hours.  Invalid input(s): CO CBG (last 3)  No results for input(s): GLUCAP in the last 72 hours.  Wt Readings from Last 3 Encounters:  11/04/14 44.8 kg (98 lb 12.3 oz)  10/25/14 51.302 kg (113 lb 1.6 oz)  07/09/14 49.17 kg (108 lb 6.4 oz)    Physical Exam:  Constitutional:  69 year old right handed frail female. No acute distress HENT:  Head: Normocephalic.  Eyes: EOM are normal. Pupils are equal, round, and reactive to light.     Neck: Normal range of motion. Neck supple. No tracheal deviation present. No thyromegaly present.  Cardiovascular: Normal rate and regular rhythm.  No murmur heard. Respiratory: Effort normal and breath sounds normal. No respiratory distress. She has no wheezes. She has no rales.  GI: Soft. Bowel sounds are normal. She exhibits no distension. There is no tenderness. There is no rebound.  Musculoskeletal: She exhibits tenderness. She exhibits no edema.  She moves all extremities  Neurological: Coordination abnormal.  Patient is alert flat effect. Follows simple commands. Oriented to name and hospital---not time,reason. Moves all 4's fairly equally with inconsistent effort. Skin: Skin is warm and dry.  Psychiatric:  Remains confused, non-agitated  Assessment/Plan: 1. Functional deficits secondary to TBI which require 3+ hours per day of interdisciplinary therapy in a comprehensive inpatient rehab setting. Physiatrist is providing  close team supervision and 24 hour management of active medical problems listed below. Physiatrist and rehab team continue to assess barriers to discharge/monitor patient progress toward functional and medical goals.  FIM: FIM - Bathing Bathing Steps Patient Completed: Chest, Right Arm, Left Arm, Front perineal area, Buttocks, Right upper leg, Left upper leg, Right lower leg (including foot), Left lower leg (including foot), Abdomen Bathing: 4: Steadying assist  FIM - Upper Body Dressing/Undressing Upper body dressing/undressing steps patient completed: Thread/unthread right bra strap, Thread/unthread right sleeve of pullover shirt/dresss, Put head through opening of pull over shirt/dress, Thread/unthread left sleeve of pullover shirt/dress, Pull shirt over trunk, Hook/unhook bra, Thread/unthread left bra strap Upper body dressing/undressing: 5: Supervision: Safety issues/verbal cues FIM - Lower Body Dressing/Undressing Lower body dressing/undressing steps patient completed: Thread/unthread right underwear leg, Thread/unthread left underwear leg, Pull underwear up/down, Don/Doff left sock, Don/Doff right sock, Thread/unthread right pants leg, Pull pants up/down, Thread/unthread left pants leg, Don/Doff right shoe, Don/Doff left shoe Lower body dressing/undressing: 4: Steadying Assist  FIM - Toileting Toileting steps completed by patient: Adjust clothing prior to toileting, Adjust clothing after toileting Toileting Assistive Devices: Grab bar or rail for support Toileting: 3: Mod-Patient completed 2 of 3 steps  FIM - Diplomatic Services operational officer Devices: Grab bars, Elevated toilet seat Toilet Transfers: 4-To toilet/BSC: Min A (steadying Pt. > 75%), 4-From toilet/BSC: Min A (steadying Pt. > 75%)  FIM - Bed/Chair Transfer Bed/Chair Transfer Assistive Devices: Arm rests, HOB elevated Bed/Chair Transfer: 2: Supine > Sit: Max A (lifting assist/Pt. 25-49%), 4: Bed > Chair or W/C:  Min A (steadying Pt. > 75%),  4: Chair or W/C > Bed: Min A (steadying Pt. > 75%)  FIM - Locomotion: Wheelchair Locomotion: Wheelchair: 0: Activity did not occur FIM - Locomotion: Ambulation Locomotion: Ambulation Assistive Devices: Other (comment) (HHA) Ambulation/Gait Assistance: 4: Min assist Locomotion: Ambulation: 4: Travels 150 ft or more with minimal assistance (Pt.>75%)  Comprehension Comprehension Mode: Auditory Comprehension: 3-Understands basic 50 - 74% of the time/requires cueing 25 - 50%  of the time  Expression Expression Mode: Verbal Expression: 3-Expresses basic 50 - 74% of the time/requires cueing 25 - 50% of the time. Needs to repeat parts of sentences.  Social Interaction Social Interaction: 2-Interacts appropriately 25 - 49% of time - Needs frequent redirection.  Problem Solving Problem Solving: 1-Solves basic less than 25% of the time - needs direction nearly all the time or does not effectively solve problems and may need a restraint for safety  Memory Memory: 2-Recognizes or recalls 25 - 49% of the time/requires cueing 51 - 75% of the time  Medical Problem List and Plan: 1. Functional deficits secondary to TBI after a fall with left frontal, left temporal bleed and right cerebellar contusion as well as right basal skull fractures 2. DVT Prophylaxis/Anticoagulation: SCDs. Monitor for any signs of DVT 3. Pain Management: Tylenol as needed 4. Mood/bipolar disorder/dementia: Celexa 20 mg daily, Seroquel 300 mg twice a day, Ativan every 4 hours as needed. Fairly substantial dementia at baseline  -observe for mood swings or alterations in arousal--have not seen that so far here 5. Neuropsych: This patient is not capable of making decisions on her own behalf. 6. Skin/Wound Care: Routine skin checks 7. Fluids/Electrolytes/Nutrition: encouarge PO, RD follow up as well 8. Seizure disorder. Keppra 500 mg twice a day. Monitor for any seizure activity 9. Dysphagia.  Dysphagia- honey thick liquids/ D1. Monitor hydration  -continue HS IVF for now pending advancement of diet 10. Hypertension. Lasix 20 mg daily, lisinopril 2.5 mg daily. Monitor with increased mobility  -recent labs normal.  11. Urinary frequency. Having persistent elevated PVR's (perhaps a little better). DC'ed enablex. continue Flomax 0.4 mg daily at bedtime. Continue I/O cath prn -Urine + 100k enterococcus: amoxil for 5 days  -may need to consider foley if no improvement  -will add low dose urecholine 12.  Orthostatic Hypotension---flomax and lisinopril stopped---  LOS (Days) 10 A FACE TO FACE EVALUATION WAS PERFORMED                                                               Kelvyn Schunk T 11/04/2014 7:57 AM

## 2014-11-04 NOTE — Progress Notes (Signed)
Nursing Note: Pt unable to void.Bladder scan for 486 and in and out cathed for 450 cc of clear yellow urine.Pt tolerated well.wbb

## 2014-11-04 NOTE — Progress Notes (Signed)
Occupational Therapy Note  Patient Details  Name: Sharon [name] MRN: [id number] Date of Birth: 1946-04-03  Today's Date: 11/04/2014 OT Individual Time: [id number] OT Individual Time Calculation (min): 25 min  and  Today's Date: 11/04/2014 OT Missed Time: 35 Minutes Missed Time Reason: Patient unwilling/refused to participate without medical reason  Pt denied pain Individual Therapy  Pt resting in bed upon arrival and greeted patient appropriately.  Pt oriented to city upon questioning.  Pt required max questioning cues to orient to hospital and mod verbal cues to use visual aid to orient to date with 50% accuracy.  Pt initially sat EOB with max encouragement but physically resisted standing to walk to w/c to engaged in BADLs at sink.  Pt denied having to use toilet.  Pt repeatedly stated that she would get up at 10.  Pt acknowledged that she would get up at 9 at home but repeatedly refused sitting EOB after laying back into bed.  Pt agreed to get up at 10 for her next therapy.  Focus on activity participation, bed mobility, orientation, cognitive remediation.  Lavone NeriLanier, [facility] 11/04/2014, 9:48 AM

## 2014-11-04 NOTE — Progress Notes (Signed)
SLP Cancellation Note  Patient Details Name: Sharon [name] MRN: [id number] DOB: [birth year]   Cancelled treatment:       Patient missed 60 minutes of skilled SLP due to refusal to participate despite Max A encouragement. SLP attempted to transfer patient out of bed and patient became verbally agitated and attempted to push clinician away. RN made aware. Continue with current plan of care.  Sharon Gottronourtney Clementina Mareno, MA, CCC-SLP [phone]                                                                                                Sharon [name] 11/04/2014, 10:16 AM

## 2014-11-04 NOTE — Progress Notes (Signed)
Physical Therapy Session Note  Patient Details  Name: Sharon [name] MRN: [id number] Date of Birth: Nov 03, 1945  Today's Date: 11/04/2014 PT Individual Time: 1430-1518 PT Individual Time Calculation (min): 48 min   Short Term Goals: Week 2:  PT Short Term Goal 1 (Week 2): Patient will perform bed mobility with supervision 50% of time.  PT Short Term Goal 2 (Week 2): Patient will transfer bed <> wheelchair with consistent min A. PT Short Term Goal 3 (Week 2): Patient will demonstrate dynamic standing balance x 5 min with consistent min A. PT Short Term Goal 4 (Week 2): Patient will demonstrate sustained attention to functional task x 60 sec with max cues.  PT Short Term Goal 5 (Week 2): Patient will demonstrate functional problem solving for basic mobility task with max cues.   Skilled Therapeutic Interventions/Progress Updates:   Session focused on arousal, sustained attention, dynamic standing balance, and activity tolerance. Patient received asleep in bed, husband in room. Patient awakened and HOB elevated, patient transferred supine > sit with min A and remained sitting edge of bed with total A to don shoes. Patient reported yes to questioning for bathroom, continent of bowel performed toileting with S-steady assist with max cues for sequencing. Patient required mod cues for thorough hand hygiene and ambulated throughout rehab unit with supervision-min guard. Patient continuously stating throughout session, "I'm tired, I want to lay down, I don't feel good, my back hurts." Seated on couch in ADL apartment, engaged patient in simple sorting task for bean bags by color. Patient able to sort with 85% accuracy and identified/corrected errors after initial min cues for error correction. Patient only able to complete task with therapist initiating handing patient bags as she refused to pick up bags out of bin to sort. Progressed to identifying food groups, common foods for different meals, and sorting by  similarities and differences from field of 2 with max multimodal assist. Patient's sustained attention and therefore accuracy improved with repetition. Patient fatigued and unwilling to complete further therapeutic tasks. Patient unable to path find back to room from therapy gym despite max multimodal assist with use of signs. Once in correct hallway, she was able to locate room with supervision. Patient left sidelying in bed, alarm on and 3 rails up.   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

## 2014-11-04 NOTE — Patient Care Conference (Signed)
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 Urine retention 11/03/2014  . Dysphagia, pharyngoesophageal phase 11/03/2014  . 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 Frontal lobe contusion   . Hypoxemia 10/21/2014  . Pulmonary edema   . Intracerebral hemorrhage 10/20/2014  . Intracranial hemorrhage 10/14/2014  . Other pancytopenia 06/03/2014    Expected Discharge Date: Expected Discharge Date: 11/09/14  Team Members Present: Physician leading conference: Dr. Faith Rogue Social Worker Present: Amada Jupiter, LCSW Nurse Present: Carlean Purl, RN PT Present: Bayard Hugger, PT OT Present: Scherrie November, OT SLP Present: Feliberto Gottron, SLP Other (Discipline and Name): Ottie Glazier, RN [facility]) PPS Coordinator present : Tora Duck, RN, CRRN     Current Status/Progress Goal Weekly Team Focus  Medical   SLEEPING FAIRLY WELL, still with i and o caths. oral intake issues  improve intake nutrition  nurition, swallowing   Bowel/Bladder   Incontinent of bowel. I&O cathing every 8 hours.  To empty bladder without use of I&O cathing.   Timed toileting   Swallow/Nutrition/ Hydration   Dys. 1 textures with honey-thick liquids via teaspoon; Max A for use of swallow strategies  Mod A for use of swallow strategies  increase use of  swallow strategies, tolerance of current diet with increased PO intake, possible trials of nectar-thick liquids   ADL's   decreased activity tolerance/arousal; min/mod A overall  min assist overall and supervision UB self-care   attention, initiation, cognitive remediation, functional transfers, standing balance, activity tolerance   Mobility   overall min A with functional mobility but can require +2 assist for safety with fluctuating status throughout day, fatigues quickly  S-min A  attention, problem solving, orientation, initiation, arousal, standing balance, NMR, functional mobility, activity tolerance, pt/family education   Communication   Mod A for 75% intelligibility at word level  Min A at phrase level  increase overall verbal expression, wearing dentures during therapy   Safety/Cognition/ Behavioral Observations  Mod-Max A  overall Min A with Mod A for attention  arousal, sustained attention, initiation, functional, familiar problem-solving, family education   Pain   No complaints of pain  Patient will remain at or below a 3  Continue to monitor pain every 4 hours and prn   Skin   no current skin breakdown  patient will remain free from skin breakdown while in rehab.   Continue to monitor skin every shift and prn    Rehab Goals Patient on target to meet rehab goals: Yes *See Care Plan and progress notes for long and short-term goals.  Barriers to Discharge: see prior    Possible Resolutions to Barriers:  continued NMR, cognitive perceptual rx    Discharge Planning/Teaching Needs:  home with husband to provide/ arrange 24/7 assistance      Team Discussion:  Still  requiring I/O caths; IVF @ hs; poor po and UTI.  Supervision with mobility overall but does present with posterior lean.  Some overall improvement.  Need to repeat MBS as still on honey thick liquids.  Need to have husband in for education.  SW reports husband planning private duty but also to offer option of Adult  Day Care  Revisions to Treatment Plan:  none   Continued Need for Acute Rehabilitation Level of Care: The patient requires daily medical management by a physician with specialized training in physical medicine and rehabilitation for the following conditions: Daily direction of a multidisciplinary physical rehabilitation program to ensure safe treatment while eliciting the highest outcome that is of practical value to the patient.: Yes Daily medical management of patient stability for increased activity during participation in an intensive rehabilitation regime.: Yes Daily analysis of laboratory values and/or radiology reports with any subsequent need for medication adjustment of medical intervention for : Neurological problems;Other  Sharon [name] 11/04/2014, 3:19 PM

## 2014-11-04 NOTE — Progress Notes (Signed)
Occupational Therapy Note  Patient Details  Name: Elenora GammaJoan L Pitera MRN: [id number] Date of Birth: June 03, 1946  Today's Date: 11/04/2014 OT Missed Time: 60 Minutes Missed Time Reason: Patient unwilling/refused to participate without medical reason  Pt missed 60 mins skilled OT services.  Pt asleep upon arrival and required mod multimodal cues to arouse.  Pt repeatedly stated she wasn't going to get OOB.  Pt physically resisted attempts by therapist to assist patient to sit EOB.     Lavone NeriLanier, Jaskirat Schwieger [facility] 11/04/2014, 2:37 PM

## 2014-11-05 ENCOUNTER — Inpatient Hospital Stay (HOSPITAL_COMMUNITY): Payer: Medicare Other

## 2014-11-05 ENCOUNTER — Inpatient Hospital Stay (HOSPITAL_COMMUNITY): Payer: Medicare Other | Admitting: Speech Pathology

## 2014-11-05 ENCOUNTER — Inpatient Hospital Stay (HOSPITAL_COMMUNITY): Payer: Medicare Other | Admitting: Physical Therapy

## 2014-11-05 ENCOUNTER — Inpatient Hospital Stay (HOSPITAL_COMMUNITY): Payer: Medicare Other | Admitting: Occupational Therapy

## 2014-11-05 LAB — BASIC METABOLIC PANEL
Anion gap: 12 (ref 5–15)
BUN: 16 mg/dL (ref 6–23)
CHLORIDE: 100 mmol/L (ref 96–112)
CO2: 23 mmol/L (ref 19–32)
Calcium: 8.8 mg/dL (ref 8.4–10.5)
Creatinine, Ser: 0.56 mg/dL (ref 0.50–1.10)
GFR calc non Af Amer: >90 mL/min (ref >90)
GLUCOSE: 94 mg/dL (ref 70–99)
POTASSIUM: 4.1 mmol/L (ref 3.5–5.1)
SODIUM: 135 mmol/L (ref 135–145)

## 2014-11-05 MED ORDER — MEGESTROL ACETATE 400 MG/10ML PO SUSP
400.0000 mg | Freq: Two times a day (BID) | ORAL | Status: DC
Start: 2014-11-05 — End: 2014-11-16
  Administered 2014-11-05 – 2014-11-15 (×20): 400 mg via ORAL
  Filled 2014-11-05 (×25): qty 10

## 2014-11-05 MED ORDER — BETHANECHOL CHLORIDE 25 MG PO TABS
25.0000 mg | ORAL_TABLET | Freq: Three times a day (TID) | ORAL | Status: DC
  Administered 2014-11-05 – 2014-11-15 (×32): 25 mg via ORAL
  Filled 2014-11-05 (×37): qty 1

## 2014-11-05 NOTE — Progress Notes (Signed)
Speech-Language Pathology Note    MBSS complete. Full report located under chart review in imaging section.  Feliberto Gottronourtney Jeffery Bachmeier, KentuckyMA, CCC-SLP [phone]

## 2014-11-05 NOTE — Progress Notes (Signed)
Occupational Therapy Session Note  Patient Details  Name: Sharon [name] MRN: [id number] Date of Birth: November 15, 1945  Today's Date: 11/05/2014 OT Individual Time: 0900-1000 OT Individual Time Calculation (min): 60 min    Short Term Goals: Week 2:  OT Short Term Goal 1 (Week 2): STG=LTG  Skilled Therapeutic Interventions/Progress Updates:    Pt resting in bed with husband present.  Pt's husband assisted patient to w/c and performed bathing and dressing tasks.  Pt unwilling to participate in tasks and pt's husband performed tasks.  Pt's husband stated that often he had to complete tasks at home.  Pt stated that patient took tub baths at home.  Pt transitioned to ADL apartment and practiced tub transfer.  Pt able to step over into tub with min A but required tot A to sit down and stand up from tub surface.  Recommended to husband to NOT attempt tub transfer at home until after home therapist had practiced in home environment.  Pt's husband verbalized understanding. Pt's husband stated that the patient required increased assistance to initiate and complete tasks compared to PLOF.   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

## 2014-11-05 NOTE — Progress Notes (Signed)
[facility]     PROGRESS NOTE    Subjective/Complaints: Slept well. No real changes. Still needing caths. Eating better.  ROS limited by cognition  Objective: Vital Signs: Blood pressure 129/62, pulse 62, temperature 98.4 F (36.9 C), temperature source Oral, resp. rate 17, height 5\' 4"  (1.626 m), weight 44 kg (97 lb), SpO2 100 %. No results found. No results for input(s): WBC, HGB, HCT, PLT in the last 72 hours.  Recent Labs  11/05/14 0617  NA 135  K 4.1  CL 100  GLUCOSE 94  BUN 16  CREATININE 0.56  CALCIUM 8.8   CBG (last 3)  No results for input(s): GLUCAP in the last 72 hours.  Wt Readings from Last 3 Encounters:  11/05/14 44 kg (97 lb)  10/25/14 51.302 kg (113 lb 1.6 oz)  07/09/14 49.17 kg (108 lb 6.4 oz)    Physical Exam:  Constitutional:   No acute distress HENT:  Head: Normocephalic.  Eyes: EOM are normal. Pupils are equal, round, and reactive to light.     Neck: Normal range of motion. Neck supple. No tracheal deviation present. No thyromegaly present.  Cardiovascular: Normal rate and regular rhythm.  No murmur heard. Respiratory: Effort normal and breath sounds normal. No respiratory distress. She has no wheezes. She has no rales.  GI: Soft. Bowel sounds are normal. She exhibits no distension. There is no tenderness. There is no rebound.  Musculoskeletal: She exhibits tenderness. She exhibits no edema.  She moves all extremities  Neurological: Coordination abnormal.  Patient is alert flat effect. Follows simple commands. Oriented to name and hospital---not time,reason. Moves all 4's fairly equally with inconsistent effort. Skin: Skin is warm and dry.  Psychiatric:  Remains confused, non-agitated  Assessment/Plan: 1. Functional deficits secondary to TBI which require 3+ hours per day of interdisciplinary therapy in a comprehensive inpatient rehab setting. Physiatrist is providing close team supervision and 24 hour  management of active medical problems listed below. Physiatrist and rehab team continue to assess barriers to discharge/monitor patient progress toward functional and medical goals.  FIM: FIM - Bathing Bathing Steps Patient Completed: Chest, Right Arm, Left Arm, Front perineal area, Buttocks, Right upper leg, Left upper leg, Right lower leg (including foot), Left lower leg (including foot), Abdomen Bathing: 4: Steadying assist  FIM - Upper Body Dressing/Undressing Upper body dressing/undressing steps patient completed: Thread/unthread right bra strap, Thread/unthread right sleeve of pullover shirt/dresss, Put head through opening of pull over shirt/dress, Thread/unthread left sleeve of pullover shirt/dress, Pull shirt over trunk, Hook/unhook bra, Thread/unthread left bra strap Upper body dressing/undressing: 5: Supervision: Safety issues/verbal cues FIM - Lower Body Dressing/Undressing Lower body dressing/undressing steps patient completed: Thread/unthread right underwear leg, Thread/unthread left underwear leg, Pull underwear up/down, Don/Doff left sock, Don/Doff right sock, Thread/unthread right pants leg, Pull pants up/down, Thread/unthread left pants leg, Don/Doff right shoe, Don/Doff left shoe Lower body dressing/undressing: 4: Steadying Assist  FIM - Toileting Toileting steps completed by patient: Adjust clothing prior to toileting, Adjust clothing after toileting, Performs perineal hygiene Toileting Assistive Devices: Grab bar or rail for support Toileting: 4: Steadying assist  FIM - Diplomatic Services operational officerToilet Transfers Toilet Transfers Assistive Devices: Grab bars, Elevated toilet seat Toilet Transfers: 5-From toilet/BSC: Supervision (verbal cues/safety issues), 5-To toilet/BSC: Supervision (verbal cues/safety issues)  FIM - BankerBed/Chair Transfer Bed/Chair Transfer Assistive Devices: Arm rests, HOB elevated Bed/Chair Transfer: 4: Supine > Sit: Min A (steadying Pt. > 75%/lift 1 leg), 5: Sit > Supine:  Supervision (verbal cues/safety issues), 5:  Chair or W/C > Bed: Supervision (verbal cues/safety issues), 4: Bed > Chair or W/C: Min A (steadying Pt. > 75%)  FIM - Locomotion: Wheelchair Locomotion: Wheelchair: 0: Activity did not occur FIM - Locomotion: Ambulation Locomotion: Ambulation Assistive Devices: Other (comment) (none) Ambulation/Gait Assistance: 5: Supervision, 4: Min guard Locomotion: Ambulation: 4: Travels 150 ft or more with minimal assistance (Pt.>75%)  Comprehension Comprehension Mode: Auditory Comprehension: 3-Understands basic 50 - 74% of the time/requires cueing 25 - 50%  of the time  Expression Expression Mode: Verbal Expression: 3-Expresses basic 50 - 74% of the time/requires cueing 25 - 50% of the time. Needs to repeat parts of sentences.  Social Interaction Social Interaction: 2-Interacts appropriately 25 - 49% of time - Needs frequent redirection.  Problem Solving Problem Solving: 1-Solves basic less than 25% of the time - needs direction nearly all the time or does not effectively solve problems and may need a restraint for safety  Memory Memory: 2-Recognizes or recalls 25 - 49% of the time/requires cueing 51 - 75% of the time  Medical Problem List and Plan: 1. Functional deficits secondary to TBI after a fall with left frontal, left temporal bleed and right cerebellar contusion as well as right basal skull fractures 2. DVT Prophylaxis/Anticoagulation: SCDs. Monitor for any signs of DVT 3. Pain Management: Tylenol as needed 4. Mood/bipolar disorder/dementia: Celexa 20 mg daily, Seroquel 300 mg twice a day, Ativan every 4 hours as needed. Fairly substantial dementia at baseline  -observe for mood swings or alterations in arousal--have not seen that so far here 5. Neuropsych: This patient is not capable of making decisions on her own behalf. 6. Skin/Wound Care: Routine skin checks 7. Fluids/Electrolytes/Nutrition: encouarge PO, RD follow up as well 8. Seizure  disorder. Keppra 500 mg twice a day. Monitor for any seizure activity 9. Dysphagia. Dysphagia- honey thick liquids/ D1. Monitor hydration  -continue HS IVF for now pending advancement of diet (reduced rate to 50cc/hr) 10. Hypertension. Lasix 20 mg daily, lisinopril 2.5 mg daily. Monitor with increased mobility  -recent labs normal.  11. Urinary frequency. Having persistent elevated PVR's (perhaps a little better). DC'ed enablex. continue Flomax 0.4 mg daily at bedtime. Continue I/O cath prn -Urine + 100k enterococcus: amoxil for 5 days  -may need to consider foley if no improvement  -added low dose urecholine---increase to  tid today 12.  Orthostatic Hypotension---flomax and lisinopril stopped---  LOS (Days) 11 A FACE TO FACE EVALUATION WAS PERFORMED                                                               Sebastyan Snodgrass T 11/05/2014 8:15 AM

## 2014-11-05 NOTE — Progress Notes (Signed)
Physical Therapy Session Note  Patient Details  Name: Sharon [name] MRN: [id number] Date of Birth: 1945/10/26  Today's Date: 11/05/2014 PT Individual Time: 1425-1530 PT Individual Time Calculation (min): 65 min   Short Term Goals: Week 2:  PT Short Term Goal 1 (Week 2): Patient will perform bed mobility with supervision 50% of time.  PT Short Term Goal 2 (Week 2): Patient will transfer bed <> wheelchair with consistent min A. PT Short Term Goal 3 (Week 2): Patient will demonstrate dynamic standing balance x 5 min with consistent min A. PT Short Term Goal 4 (Week 2): Patient will demonstrate sustained attention to functional task x 60 sec with max cues.  PT Short Term Goal 5 (Week 2): Patient will demonstrate functional problem solving for basic mobility task with max cues.   Skilled Therapeutic Interventions/Progress Updates:   Session focused on alertness, sustained attention to functional mobility tasks, participation, standing balance, and activity tolerance. Patient received sitting in bed with RN feeding patient pudding, husband at bedside. Patient transferred to edge of bed with min A to initiate, patient sat edge of bed and required min cues to self feed rest of pudding cup. Patient responded well to husband telling patient to participate with therapy, although she continued to state throughout session that she was tired and wanted to lay down. Patient ambulated with min guard > supervision throughout rehab unit and negotiated up/down twelve 6" steps using 2 rails. Patient able to sustain attention up to 30 sec with max A for standing clothespin task, seated game of Connect 4 with max cues for game strategy and supervision for turn taking and adherence to game rules, NuStep using BUE/BLE for total of 5 min, and seated ball toss with cognitive dual task of naming food item. Patient required max multimodal cues for naming items. Patient with poor frustration tolerance throughout session. Patient  left sidelying in bed with bed alarm on and 3 rails up.   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

## 2014-11-05 NOTE — Progress Notes (Signed)
Occupational Therapy Session Note  Patient Details  Name: Sharon [name] MRN: [id number] Date of Birth: 1946-06-08  Today's Date: 11/05/2014 OT Individual Time:  -   1630-1700  (30 min)   Short Term Goals: Week 1:  OT Short Term Goal 1 (Week 1): Pt will consistently be oriented x3 with max cues OT Short Term Goal 1 - Progress (Week 1): Progressing toward goal OT Short Term Goal 2 (Week 1): Pt will sustain attention to functional task for 30 seconds with max cues OT Short Term Goal 2 - Progress (Week 1): Met OT Short Term Goal 3 (Week 1): Pt will stand for 30 seconds during functional activity with mod assist OT Short Term Goal 3 - Progress (Week 1): Met OT Short Term Goal 4 (Week 1): Pt will initiate and complete bathing with mod cues and min assist OT Short Term Goal 4 - Progress (Week 1): Met OT Short Term Goal 5 (Week 1): Pt will utilize LUE 50% of time during self-care task with min cues OT Short Term Goal 5 - Progress (Week 1): Met Week 2:  OT Short Term Goal 1 (Week 2): STG=LTG  Skilled Therapeutic Interventions/Progress Updates:     Treatment focus on mobility, transfers, sit to stand, dynamic balance. Engaged in bed mobility to left side of bed with SBA and cues for organizing.  Performed bed to wc transfer and then to toilet with min assist.  Pt. Donned and doffed clothes for toileting and performed peri care after urine.  She engaged in grooming ( hair and hand washing) at sink.  Propelled wc to nursing station and left with safety belt on.    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

## 2014-11-05 NOTE — Progress Notes (Signed)
Speech Language Pathology Daily Session Note  Patient Details  Name: Sharon [name] MRN: [id number] Date of Birth: [birth year]  Today's Date: 11/05/2014 SLP Individual Time: 1040-1100 SLP Individual Time Calculation (min): 20 min and Today's Date: 11/05/2014 SLP Missed Time: 10 Minutes Missed Time Reason: Patient ill (Comment);Patient fatigue  Short Term Goals: Week 2: SLP Short Term Goal 1 (Week 2): Patient will orient to place, time and situation with use of external aids with Mod A question and verbal cues.  SLP Short Term Goal 2 (Week 2): Patient will demonstrate functional problem solving for basic and familiar self-care tasks with Max A verbal cues.  SLP Short Term Goal 3 (Week 2): Patient will sustain attention to a functional task for 5 minutes with Mod A verbal cues for redirection.  SLP Short Term Goal 4 (Week 2): Patient will consume current diet with minimal overt s/s of aspiration with Mod A verbal cues for use of swallowing compensatory strategies.  SLP Short Term Goal 5 (Week 2): Patient will utilize speech intelligibility strategies with Max A verbal cues with 75% of observable opportunities to increase intelligibility to 75% at the phrase level.   Skilled Therapeutic Interventions: Skilled treatment session focused on swallow goals.  SLP facilitated session by providing max A for patient drinking honey thick liquids by spoon.  Patient required frequent verbal cueing to swallow bolus.  SLP attempted to have patient continue with therapy; however, patient continued to complain about her headache.  She required frequent verbal/tactile cues to attend to swallow treatment.  SLP eventually had to end session due to patient being uncooperative and unable to continue an appropriate level of alertness to swallow therapy or transition into cognitive tasks. Continue with current plan of care.       FIM:  Comprehension Comprehension Mode: Auditory Comprehension: 3-Understands basic 50 - 74%  of the time/requires cueing 25 - 50%  of the time Expression Expression Mode: Verbal Expression: 3-Expresses basic 50 - 74% of the time/requires cueing 25 - 50% of the time. Needs to repeat parts of sentences. Social Interaction Social Interaction: 2-Interacts appropriately 25 - 49% of time - Needs frequent redirection. Problem Solving Problem Solving: 1-Solves basic less than 25% of the time - needs direction nearly all the time or does not effectively solve problems and may need a restraint for safety Memory Memory: 2-Recognizes or recalls 25 - 49% of the time/requires cueing 51 - 75% of the time FIM - Eating Eating Activity: 3: Helper scoops food on utensil every scoop;3: Helper brings food to mouth every scoop;1: Helper feeds patient  Pain Pain Assessment Pain Assessment: Faces Faces Pain Scale: Hurts a little bit Pain Type: Acute pain Pain Location: Head Pain Orientation: Other (Comment) (did not specify) Pain Descriptors / Indicators: Pounding Pain Onset: Gradual Patients Stated Pain Goal: 0 Pain Intervention(s): RN made aware Multiple Pain Sites: No  Therapy/Group: Individual Therapy   Cranford [name], M.A. CCC-SLP Cranford MonKonrad, [name] 11/05/2014, 4:08 PM

## 2014-11-06 ENCOUNTER — Inpatient Hospital Stay (HOSPITAL_COMMUNITY): Payer: Medicare Other | Admitting: Occupational Therapy

## 2014-11-06 ENCOUNTER — Inpatient Hospital Stay (HOSPITAL_COMMUNITY): Payer: Medicare Other | Admitting: Speech Pathology

## 2014-11-06 NOTE — Progress Notes (Signed)
[facility]     PROGRESS NOTE    Subjective/Complaints: Slept well. Pleasant,alert--denies pain ROS limited by cognition  Objective: Vital Signs: Blood pressure 113/47, pulse 60, temperature 98.1 F (36.7 C), temperature source Oral, resp. rate 18, height  (1.626 m), weight 44.6 kg (98 lb 5.2 oz), SpO2 98 %. Dg Swallowing Func-speech Pathology  11/05/2014    Objective Swallowing Evaluation:    Patient Details  Name: Sharon [name] MRN: [id number] Date of Birth: 14-Feb-1946  Today's Date: 11/05/2014 Time: SLP Start Time (ACUTE ONLY): 11300-SLP Stop Time (ACUTE ONLY): 1330 SLP Time Calculation (min) (ACUTE ONLY): 15 min  Past Medical History:  Past Medical History  Diagnosis Date  . Bipolar 1 disorder   . Dementia   . Other pancytopenia 06/03/2014  . Stroke    Past Surgical History:  Past Surgical History  Procedure Laterality Date  . Cholecystectomy     HPI:  HPI: Pt is a 69 y/o female with PMH of advanced dementia and bipolar  disorder who came in to [facility] following a fall at home and seizure activity.  CT revealed hemorrhagic contusion in the R cerebellum. Pt had MBS on  10/21/13 and recommended Dys. 1 textures with honey-thick liquids via tsp.  Patient transferred to CIR on 10/25/14 and has had intermittent  participation in therapy with limited gains. MBS today to assess for  possible upgrade.   No Data Recorded  Assessment / Plan / Recommendation CHL IP CLINICAL IMPRESSIONS 11/05/2014  Dysphagia Diagnosis Moderate oral phase dysphagia;Moderate pharyngeal  phase dysphagia  Clinical impression Pt presents with moderate oral and moderate pharyngeal  phase dysphagia characterized by delayed oral transit, base of tongue  weakness, delayed swallow initiation to the pyriform sinuses and  vallecular residue with all consistencies. Patient demonstrated sensed  penetration to the chords after the swallow with honey-thick liquids via  cup and flash penetration with nectar-thick  liquids via cup. Thin and  puree trials were unable to be tested at this time. Trials were limited  this study despite extra time and Max encouragement due to patient  restlessness, refusal to consume further trials and reports of stomach  ache with nausea. Due to limited trials, lethargy and severe cognitive  impairments, recommend patient continue current diet of Dys. 1 texture  with honey-thick liquids via tsp with trials of nectar-thick liquids via  cup with SLP only. Educated patient's husband in regards to the patient's  current cognitive function and its overall impact on her swallowing  function, diet recommendations and goals of skilled SLP intervention. He  verbalized understanding. Recommend repeat MBS when patient's overall  ability to participate improves. Continue with current plan of care.       CHL IP TREATMENT RECOMMENDATION 11/05/2014  Treatment Plan Recommendations Therapy as outlined in treatment plan below      CHL IP DIET RECOMMENDATION 11/05/2014  Diet Recommendations Dysphagia 1 (Puree);Honey-thick liquid  Liquid Administration via Spoon  Medication Administration Whole meds with puree  Compensations Slow rate;Small sips/bites;Check for pocketing;Clear throat  intermittently  Postural Changes and/or Swallow Maneuvers Seated upright 90  degrees;Upright 30-60 min after meal     CHL IP OTHER RECOMMENDATIONS 11/05/2014  Recommended Consults (None)  Oral Care Recommendations Oral care BID  Other Recommendations Order thickener from pharmacy;Prohibited food  (jello, ice cream, thin soups);Remove water pitcher     CHL IP FOLLOW UP RECOMMENDATIONS 11/05/2014  Follow up Recommendations 24 hour supervision/assistance;Skilled Nursing  facility     [facility]  IP FREQUENCY AND DURATION 10/21/2014  Speech Therapy Frequency (ACUTE ONLY) min 3x week  Treatment Duration 2 weeks     Pertinent Vitals/Pain N/A    SLP Swallow Goals No flowsheet data found.  No flowsheet data found.    CHL IP REASON FOR REFERRAL 11/05/2014   Reason for Referral Objectively evaluate swallowing function     CHL IP ORAL PHASE 11/05/2014  Lips (None)  Tongue (None)  Mucous membranes (None)  Nutritional status (None)  Other (None)  Oxygen therapy (None)  Oral Phase Impaired  Oral - Pudding Teaspoon (None)  Oral - Pudding Cup (None)  Oral - Honey Teaspoon Delayed oral transit;Lingual pumping  Oral - Honey Cup Delayed oral transit;Lingual pumping  Oral - Honey Syringe (None)  Oral - Nectar Teaspoon Delayed oral transit;Lingual pumping  Oral - Nectar Cup Delayed oral transit;Lingual pumping  Oral - Nectar Straw NT  Oral - Nectar Syringe (None)  Oral - Ice Chips (None)  Oral - Thin Teaspoon NT  Oral - Thin Cup NT  Oral - Thin Straw NT  Oral - Thin Syringe (None)  Oral - Puree NT  Oral - Mechanical Soft (None)  Oral - Regular NT  Oral - Multi-consistency (None)  Oral - Pill (None)  Oral Phase - Comment (None)      CHL IP PHARYNGEAL PHASE 11/05/2014  Pharyngeal Phase Impaired  Pharyngeal - Pudding Teaspoon (None)  Penetration/Aspiration details (pudding teaspoon) (None)  Pharyngeal - Pudding Cup (None)  Penetration/Aspiration details (pudding cup) (None)  Pharyngeal - Honey Teaspoon Delayed swallow initiation;Reduced tongue base  retraction;Pharyngeal residue - valleculae  Penetration/Aspiration details (honey teaspoon) (None)  Pharyngeal - Honey Cup Delayed swallow initiation;Pharyngeal residue -  valleculae;Penetration/Aspiration after swallow  Penetration/Aspiration details (honey cup) Material enters airway,  CONTACTS cords and not ejected out  Pharyngeal - Honey Syringe (None)  Penetration/Aspiration details (honey syringe) (None)  Pharyngeal - Nectar Teaspoon Delayed swallow initiation;Pharyngeal residue  - valleculae  Penetration/Aspiration details (nectar teaspoon) (None)  Pharyngeal - Nectar Cup Delayed swallow initiation;Pharyngeal residue -  valleculae  Penetration/Aspiration details (nectar cup) Material enters airway,  remains ABOVE vocal cords then  ejected out  Pharyngeal - Nectar Straw NT  Penetration/Aspiration details (nectar straw) (None)  Pharyngeal - Nectar Syringe (None)  Penetration/Aspiration details (nectar syringe) (None)  Pharyngeal - Ice Chips (None)  Penetration/Aspiration details (ice chips) (None)  Pharyngeal - Thin Teaspoon NT  Penetration/Aspiration details (thin teaspoon) (None)  Pharyngeal - Thin Cup NT  Penetration/Aspiration details (thin cup) (None)  Pharyngeal - Thin Straw NT  Penetration/Aspiration details (thin straw) (None)  Pharyngeal - Thin Syringe (None)  Penetration/Aspiration details (thin syringe') (None)  Pharyngeal - Puree NT  Penetration/Aspiration details (puree) (None)  Pharyngeal - Mechanical Soft (None)  Penetration/Aspiration details (mechanical soft) (None)  Pharyngeal - Regular NT  Penetration/Aspiration details (regular) (None)  Pharyngeal - Multi-consistency (None)  Penetration/Aspiration details (multi-consistency) (None)  Pharyngeal - Pill NT  Penetration/Aspiration details (pill) (None)  Pharyngeal Comment Patient will limited trials due to refusal,  restlessness and agitation      CHL IP CERVICAL ESOPHAGEAL PHASE 11/05/2014  Cervical Esophageal Phase WFL  Pudding Teaspoon (None)  Pudding Cup (None)  Honey Teaspoon (None)  Honey Cup (None)  Honey Syringe (None)  Nectar Teaspoon (None)  Nectar Cup (None)  Nectar Straw (None)  Nectar Syringe (None)  Thin Teaspoon (None)  Thin Cup (None)  Thin Straw (None)  Thin Syringe (None)  Cervical Esophageal Comment (None)    No flowsheet data found.  PAYNE, COURTNEY 11/05/2014, 4:05 PM    No results for input(s): WBC, HGB, HCT, PLT in the last 72 hours.  Recent Labs  11/05/14 0617  NA 135  K 4.1  CL 100  GLUCOSE 94  BUN 16  CREATININE 0.56  CALCIUM 8.8   CBG (last 3)  No results for input(s): GLUCAP in the last 72 hours.  Wt Readings from Last 3 Encounters:  11/06/14 44.6 kg (98 lb 5.2 oz)  10/25/14 51.302 kg (113 lb 1.6 oz)  07/09/14 49.17 kg (108  lb 6.4 oz)    Physical Exam:  Constitutional:   No acute distress HENT:  Head: Normocephalic.  Eyes: EOM are normal. Pupils are equal, round, and reactive to light.     Neck: Normal range of motion. Neck supple. No tracheal deviation present. No thyromegaly present.  Cardiovascular: Normal rate and regular rhythm.  No murmur heard. Respiratory: Effort normal and breath sounds normal. No respiratory distress. She has no wheezes. She has no rales.  GI: Soft. Bowel sounds are normal. She exhibits no distension. There is no tenderness. There is no rebound.  Musculoskeletal: She exhibits tenderness. She exhibits no edema.  She moves all extremities  Neurological: Coordination abnormal.  Patient is alert flat effect. Follows simple commands. Oriented to name and hospital---not time,reason. Moves all 4's fairly equally with inconsistent effort. Skin: Skin is warm and dry.  Psychiatric:  Remains confused, non-agitated  Assessment/Plan: 1. Functional deficits secondary to TBI which require 3+ hours per day of interdisciplinary therapy in a comprehensive inpatient rehab setting. Physiatrist is providing close team supervision and 24 hour management of active medical problems listed below. Physiatrist and rehab team continue to assess barriers to discharge/monitor patient progress toward functional and medical goals.  FIM: FIM - Bathing Bathing Steps Patient Completed: Chest, Right Arm, Left Arm, Front perineal area, Buttocks, Right upper leg, Left upper leg, Right lower leg (including foot), Left lower leg (including foot), Abdomen Bathing: 1: Total-Patient completes 0-2 of 10 parts or less than 25%  FIM - Upper Body Dressing/Undressing Upper body dressing/undressing steps patient completed: Thread/unthread right bra strap, Thread/unthread right sleeve of pullover shirt/dresss, Put head through opening of pull over shirt/dress, Thread/unthread left sleeve of pullover shirt/dress, Pull shirt  over trunk, Hook/unhook bra, Thread/unthread left bra strap Upper body dressing/undressing: 1: Total-Patient completed less than 25% of tasks FIM - Lower Body Dressing/Undressing Lower body dressing/undressing steps patient completed: Thread/unthread right underwear leg, Thread/unthread left underwear leg, Pull underwear up/down, Don/Doff left sock, Don/Doff right sock, Thread/unthread right pants leg, Pull pants up/down, Thread/unthread left pants leg, Don/Doff right shoe, Don/Doff left shoe Lower body dressing/undressing: 1: Total-Patient completed less than 25% of tasks  FIM - Toileting Toileting steps completed by patient: Adjust clothing prior to toileting, Adjust clothing after toileting, Performs perineal hygiene Toileting Assistive Devices: Grab bar or rail for support Toileting: 4: Steadying assist  FIM - Diplomatic Services operational officerToilet Transfers Toilet Transfers Assistive Devices: Grab bars, Elevated toilet seat Toilet Transfers: 5-From toilet/BSC: Supervision (verbal cues/safety issues), 5-To toilet/BSC: Supervision (verbal cues/safety issues)  FIM - BankerBed/Chair Transfer Bed/Chair Transfer Assistive Devices: Arm rests, HOB elevated Bed/Chair Transfer: 4: Supine > Sit: Min A (steadying Pt. > 75%/lift 1 leg), 5: Sit > Supine: Supervision (verbal cues/safety issues), 4: Bed > Chair or W/C: Min A (steadying Pt. > 75%), 4: Chair or W/C > Bed: Min A (steadying Pt. > 75%)  FIM - Locomotion: Wheelchair Locomotion: Wheelchair: 0: Activity did not occur FIM - Locomotion: Ambulation Locomotion: Ambulation  Assistive Devices: Other (comment) (none) Ambulation/Gait Assistance: 5: Supervision, 4: Min guard Locomotion: Ambulation: 4: Travels 150 ft or more with minimal assistance (Pt.>75%)  Comprehension Comprehension Mode: Auditory Comprehension: 3-Understands basic 50 - 74% of the time/requires cueing 25 - 50%  of the time  Expression Expression Mode: Verbal Expression: 3-Expresses basic 50 - 74% of the  time/requires cueing 25 - 50% of the time. Needs to repeat parts of sentences.  Social Interaction Social Interaction: 2-Interacts appropriately 25 - 49% of time - Needs frequent redirection.  Problem Solving Problem Solving: 1-Solves basic less than 25% of the time - needs direction nearly all the time or does not effectively solve problems and may need a restraint for safety  Memory Memory: 2-Recognizes or recalls 25 - 49% of the time/requires cueing 51 - 75% of the time  Medical Problem List and Plan: 1. Functional deficits secondary to TBI after a fall with left frontal, left temporal bleed and right cerebellar contusion as well as right basal skull fractures 2. DVT Prophylaxis/Anticoagulation: SCDs. Monitor for any signs of DVT 3. Pain Management: Tylenol as needed 4. Mood/bipolar disorder/dementia: Celexa 20 mg daily, Seroquel 300 mg twice a day, Ativan every 4 hours as needed. Fairly substantial dementia at baseline  -observe for mood swings or alterations in arousal--have not seen that so far here 5. Neuropsych: This patient is not capable of making decisions on her own behalf. 6. Skin/Wound Care: Routine skin checks 7. Fluids/Electrolytes/Nutrition: encouarge PO, RD follow up as well 8. Seizure disorder. Keppra 500 mg twice a day. Monitor for any seizure activity 9. Dysphagia. Dysphagia- honey thick liquids/ D1. Monitor hydration  -continue HS IVF for now pending advancement of diet (reduced rate to 50cc/hr)  -added megace. Ate better yesterday  -unfortunately her MBS did not show substantial improvement 10. Hypertension. Lasix 20 mg daily, lisinopril 2.5 mg daily. Monitor with increased mobility  -recent labs normal.  11. Urinary frequency. Having persistent elevated PVR's (perhaps a little better). DC'ed enablex. continue Flomax 0.4 mg daily at bedtime. Continue I/O cath prn -Urine + 100k enterococcus: amoxil for 5 days  -may need to consider foley if no improvement  -   urecholine---increase to  tid   12.  Orthostatic Hypotension---flomax and lisinopril stopped---  LOS (Days) 12 A FACE TO FACE EVALUATION WAS PERFORMED                                                               Bre Pecina T 11/06/2014 8:37 AM

## 2014-11-07 ENCOUNTER — Inpatient Hospital Stay (HOSPITAL_COMMUNITY): Payer: Medicare Other | Admitting: Occupational Therapy

## 2014-11-07 ENCOUNTER — Inpatient Hospital Stay (HOSPITAL_COMMUNITY): Payer: Medicare Other

## 2014-11-07 NOTE — Plan of Care (Signed)
Problem: RH BLADDER ELIMINATION Goal: RH STG MANAGE BLADDER WITH ASSISTANCE STG Manage Bladder With Max Assistance  Outcome: Not Progressing In and out catheter every 8 hours

## 2014-11-07 NOTE — Progress Notes (Signed)
Occupational Therapy Session Note  Patient Details  Name: Sharon [name] MRN: [id number] Date of Birth: [birth year]  Today's Date: 11/07/2014 OT Individual Time:  - 0915-1015  (60 min)  1st session:                                         1600-1455 (55 min) 2nd session      Short Term Goals: Week 1:  OT Short Term Goal 1 (Week 1): Pt will consistently be oriented x3 with max cues OT Short Term Goal 1 - Progress (Week 1): Progressing toward goal OT Short Term Goal 2 (Week 1): Pt will sustain attention to functional task for 30 seconds with max cues OT Short Term Goal 2 - Progress (Week 1): Met OT Short Term Goal 3 (Week 1): Pt will stand for 30 seconds during functional activity with mod assist OT Short Term Goal 3 - Progress (Week 1): Met OT Short Term Goal 4 (Week 1): Pt will initiate and complete bathing with mod cues and min assist OT Short Term Goal 4 - Progress (Week 1): Met OT Short Term Goal 5 (Week 1): Pt will utilize LUE 50% of time during self-care task with min cues OT Short Term Goal 5 - Progress (Week 1): Met  Skilled Therapeutic Interventions/Progress Updates:      1st session:  OT addressed basic ADLtraining at sink level in wc.  Addressed functional transfers, bed mobility, standing balance, attention, attention, right upper extremity therapeutic activities in functional task.  Pt. In bed.   Transferred to wc>toilet with mod assist.  Positioned wc at sink and pt didbathing and dressing.  Required much encouragement to engage in activity this am.  She stated her neck and back  was hurting as well as her stomach.  Proved heat pack to back for 10 minutes.  Pt reported pain went from 8 to 5/10.  Left pt in bed with all needs in reach.        2nd session:  Pt.ying in bed upon OT arrival.  She complained of back and stomach pain  10/10  .  OT provided therapeutic massage to back.  After 5 minutes of back massage, pt agreed to get up.  Went from supine to EOB wih min assist.   Transferred to wc with min assist.  Performed grooming activities at sink.  Transferred to toilet with min assist.  Doffed street clothes and donned night clothes.  See FIM.  Transferred pt from wc to bed and left pt in bed with all needs in reach.    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), sits without warning Restrictions Weight Bearing Restrictions: No      Pain:  1st and 2nd session:  10/10 going down to 5/10 during session    See FIM for current functional status  Therapy/Group: Individual Therapy  Lisa Roca 11/07/2014, 7:50 AM

## 2014-11-07 NOTE — Progress Notes (Signed)
Physical Therapy Note  Patient Details  Name: Sharon [name] MRN: [id number] Date of Birth: [birth year] Today's Date: 11/07/2014    Attempted to see pt for treatment on this date. Pt had just gotten back in bed after being up all day per NT. Pt complaining of HA pain and fatigue due to not getting any sleep the night before. Pt encouraged that OOB mobility would help her feel better, but pt continued to refuse. Pt missed 60 minutes of scheduled PT.   Hosie SpangleGodfrey, Jhonny Calixto 11/07/2014, 3:04 PM

## 2014-11-07 NOTE — Progress Notes (Signed)
Nursing Note: No void this shift. Required in and out caths 1. 2011-375 cc 2. 0224- 450 cc. And pt scanned at 0627-123 No cath. Required. wbb

## 2014-11-07 NOTE — Progress Notes (Signed)
[facility] [place] PHYSICAL MEDICINE & REHABILITATION     PROGRESS NOTE    Subjective/Complaints: No new complaints ROS limited by cognition  Objective: Vital Signs: Blood pressure 124/48, pulse 61, temperature 97.4 F (36.3 C), temperature source Oral, resp. rate 18, height 5\' 4"  (1.626 m), weight 45.4 kg (100 lb 1.4 oz), SpO2 100 %. Dg Swallowing Func-speech Pathology  11/05/2014    Objective Swallowing Evaluation:    Patient Details  Name: Sharon [name] MRN: [id number] Date of Birth: 11-16-1945  Today's Date: 11/05/2014 Time: SLP Start Time (ACUTE ONLY): 11300-SLP Stop Time (ACUTE ONLY): 1330 SLP Time Calculation (min) (ACUTE ONLY): 15 min  Past Medical History:  Past Medical History  Diagnosis Date  . Bipolar 1 disorder   . Dementia   . Other pancytopenia 06/03/2014  . Stroke    Past Surgical History:  Past Surgical History  Procedure Laterality Date  . Cholecystectomy     HPI:  HPI: Pt is a 69 y/o female with PMH of advanced dementia and bipolar  disorder who came in to [facility] following a fall at home and seizure activity.  CT revealed hemorrhagic contusion in the R cerebellum. Pt had MBS on  10/21/13 and recommended Dys. 1 textures with honey-thick liquids via tsp.  Patient transferred to CIR on 10/25/14 and has had intermittent  participation in therapy with limited gains. MBS today to assess for  possible upgrade.   No Data Recorded  Assessment / Plan / Recommendation CHL IP CLINICAL IMPRESSIONS 11/05/2014  Dysphagia Diagnosis Moderate oral phase dysphagia;Moderate pharyngeal  phase dysphagia  Clinical impression Pt presents with moderate oral and moderate pharyngeal  phase dysphagia characterized by delayed oral transit, base of tongue  weakness, delayed swallow initiation to the pyriform sinuses and  vallecular residue with all consistencies. Patient demonstrated sensed  penetration to the chords after the swallow with honey-thick liquids via  cup and flash penetration with nectar-thick liquids via cup. Thin  and  puree trials were unable to be tested at this time. Trials were limited  this study despite extra time and Max encouragement due to patient  restlessness, refusal to consume further trials and reports of stomach  ache with nausea. Due to limited trials, lethargy and severe cognitive  impairments, recommend patient continue current diet of Dys. 1 texture  with honey-thick liquids via tsp with trials of nectar-thick liquids via  cup with SLP only. Educated patient's husband in regards to the patient's  current cognitive function and its overall impact on her swallowing  function, diet recommendations and goals of skilled SLP intervention. He  verbalized understanding. Recommend repeat MBS when patient's overall  ability to participate improves. Continue with current plan of care.       CHL IP TREATMENT RECOMMENDATION 11/05/2014  Treatment Plan Recommendations Therapy as outlined in treatment plan below      CHL IP DIET RECOMMENDATION 11/05/2014  Diet Recommendations Dysphagia 1 (Puree);Honey-thick liquid  Liquid Administration via Spoon  Medication Administration Whole meds with puree  Compensations Slow rate;Small sips/bites;Check for pocketing;Clear throat  intermittently  Postural Changes and/or Swallow Maneuvers Seated upright 90  degrees;Upright 30-60 min after meal     CHL IP OTHER RECOMMENDATIONS 11/05/2014  Recommended Consults (None)  Oral Care Recommendations Oral care BID  Other Recommendations Order thickener from pharmacy;Prohibited food  (jello, ice cream, thin soups);Remove water pitcher     CHL IP FOLLOW UP RECOMMENDATIONS 11/05/2014  Follow up Recommendations 24 hour supervision/assistance;Skilled Nursing  facility     [facility] IP  FREQUENCY AND DURATION 10/21/2014  Speech Therapy Frequency (ACUTE ONLY) min 3x week  Treatment Duration 2 weeks     Pertinent Vitals/Pain N/A    SLP Swallow Goals No flowsheet data found.  No flowsheet data found.    CHL IP REASON FOR REFERRAL 11/05/2014  Reason for Referral  Objectively evaluate swallowing function     CHL IP ORAL PHASE 11/05/2014  Lips (None)  Tongue (None)  Mucous membranes (None)  Nutritional status (None)  Other (None)  Oxygen therapy (None)  Oral Phase Impaired  Oral - Pudding Teaspoon (None)  Oral - Pudding Cup (None)  Oral - Honey Teaspoon Delayed oral transit;Lingual pumping  Oral - Honey Cup Delayed oral transit;Lingual pumping  Oral - Honey Syringe (None)  Oral - Nectar Teaspoon Delayed oral transit;Lingual pumping  Oral - Nectar Cup Delayed oral transit;Lingual pumping  Oral - Nectar Straw NT  Oral - Nectar Syringe (None)  Oral - Ice Chips (None)  Oral - Thin Teaspoon NT  Oral - Thin Cup NT  Oral - Thin Straw NT  Oral - Thin Syringe (None)  Oral - Puree NT  Oral - Mechanical Soft (None)  Oral - Regular NT  Oral - Multi-consistency (None)  Oral - Pill (None)  Oral Phase - Comment (None)      CHL IP PHARYNGEAL PHASE 11/05/2014  Pharyngeal Phase Impaired  Pharyngeal - Pudding Teaspoon (None)  Penetration/Aspiration details (pudding teaspoon) (None)  Pharyngeal - Pudding Cup (None)  Penetration/Aspiration details (pudding cup) (None)  Pharyngeal - Honey Teaspoon Delayed swallow initiation;Reduced tongue base  retraction;Pharyngeal residue - valleculae  Penetration/Aspiration details (honey teaspoon) (None)  Pharyngeal - Honey Cup Delayed swallow initiation;Pharyngeal residue -  valleculae;Penetration/Aspiration after swallow  Penetration/Aspiration details (honey cup) Material enters airway,  CONTACTS cords and not ejected out  Pharyngeal - Honey Syringe (None)  Penetration/Aspiration details (honey syringe) (None)  Pharyngeal - Nectar Teaspoon Delayed swallow initiation;Pharyngeal residue  - valleculae  Penetration/Aspiration details (nectar teaspoon) (None)  Pharyngeal - Nectar Cup Delayed swallow initiation;Pharyngeal residue -  valleculae  Penetration/Aspiration details (nectar cup) Material enters airway,  remains ABOVE vocal cords then ejected out  Pharyngeal  - Nectar Straw NT  Penetration/Aspiration details (nectar straw) (None)  Pharyngeal - Nectar Syringe (None)  Penetration/Aspiration details (nectar syringe) (None)  Pharyngeal - Ice Chips (None)  Penetration/Aspiration details (ice chips) (None)  Pharyngeal - Thin Teaspoon NT  Penetration/Aspiration details (thin teaspoon) (None)  Pharyngeal - Thin Cup NT  Penetration/Aspiration details (thin cup) (None)  Pharyngeal - Thin Straw NT  Penetration/Aspiration details (thin straw) (None)  Pharyngeal - Thin Syringe (None)  Penetration/Aspiration details (thin syringe') (None)  Pharyngeal - Puree NT  Penetration/Aspiration details (puree) (None)  Pharyngeal - Mechanical Soft (None)  Penetration/Aspiration details (mechanical soft) (None)  Pharyngeal - Regular NT  Penetration/Aspiration details (regular) (None)  Pharyngeal - Multi-consistency (None)  Penetration/Aspiration details (multi-consistency) (None)  Pharyngeal - Pill NT  Penetration/Aspiration details (pill) (None)  Pharyngeal Comment Patient will limited trials due to refusal,  restlessness and agitation      CHL IP CERVICAL ESOPHAGEAL PHASE 11/05/2014  Cervical Esophageal Phase WFL  Pudding Teaspoon (None)  Pudding Cup (None)  Honey Teaspoon (None)  Honey Cup (None)  Honey Syringe (None)  Nectar Teaspoon (None)  Nectar Cup (None)  Nectar Straw (None)  Nectar Syringe (None)  Thin Teaspoon (None)  Thin Cup (None)  Thin Straw (None)  Thin Syringe (None)  Cervical Esophageal Comment (None)    No flowsheet data found.  PAYNE, COURTNEY 11/05/2014, 4:05 PM    No results for input(s): WBC, HGB, HCT, PLT in the last 72 hours.  Recent Labs  11/05/14 0617  NA 135  K 4.1  CL 100  GLUCOSE 94  BUN 16  CREATININE 0.56  CALCIUM 8.8   CBG (last 3)  No results for input(s): GLUCAP in the last 72 hours.  Wt Readings from Last 3 Encounters:  11/07/14 45.4 kg (100 lb 1.4 oz)  10/25/14 51.302 kg (113 lb 1.6 oz)  07/09/14 49.17 kg (108 lb 6.4 oz)     Physical Exam:  Constitutional:   No acute distress HENT:  Head: Normocephalic.  Eyes: EOM are normal. Pupils are equal, round, and reactive to light.     Neck: Normal range of motion. Neck supple. No tracheal deviation present. No thyromegaly present.  Cardiovascular: Normal rate and regular rhythm.  No murmur heard. Respiratory: Effort normal and breath sounds normal. No respiratory distress. She has no wheezes. She has no rales.  GI: Soft. Bowel sounds are normal. She exhibits no distension. There is no tenderness. There is no rebound.  Musculoskeletal: She exhibits tenderness. She exhibits no edema.  She moves all extremities  Neurological: Coordination abnormal.  Patient is alert flat effect. Follows simple commands. Oriented to name and hospital---not time,reason. Moves all 4's fairly equally with inconsistent effort. Skin: Skin is warm and dry.  Psychiatric:  Remains confused, non-agitated  Assessment/Plan: 1. Functional deficits secondary to TBI which require 3+ hours per day of interdisciplinary therapy in a comprehensive inpatient rehab setting. Physiatrist is providing close team supervision and 24 hour management of active medical problems listed below. Physiatrist and rehab team continue to assess barriers to discharge/monitor patient progress toward functional and medical goals.  FIM: FIM - Bathing Bathing Steps Patient Completed: Chest, Right Arm, Left Arm, Front perineal area, Buttocks, Right upper leg, Left upper leg, Right lower leg (including foot), Left lower leg (including foot), Abdomen Bathing: 1: Total-Patient completes 0-2 of 10 parts or less than 25%  FIM - Upper Body Dressing/Undressing Upper body dressing/undressing steps patient completed: Thread/unthread right bra strap, Thread/unthread right sleeve of pullover shirt/dresss, Put head through opening of pull over shirt/dress, Thread/unthread left sleeve of pullover shirt/dress, Pull shirt over  trunk, Hook/unhook bra, Thread/unthread left bra strap Upper body dressing/undressing: 1: Total-Patient completed less than 25% of tasks FIM - Lower Body Dressing/Undressing Lower body dressing/undressing steps patient completed: Thread/unthread right underwear leg, Thread/unthread left underwear leg, Pull underwear up/down, Don/Doff left sock, Don/Doff right sock, Thread/unthread right pants leg, Pull pants up/down, Thread/unthread left pants leg, Don/Doff right shoe, Don/Doff left shoe Lower body dressing/undressing: 1: Total-Patient completed less than 25% of tasks  FIM - Toileting Toileting steps completed by patient: Adjust clothing prior to toileting, Adjust clothing after toileting, Performs perineal hygiene Toileting Assistive Devices: Grab bar or rail for support Toileting: 4: Steadying assist  FIM - Diplomatic Services operational officer Devices: Grab bars, Elevated toilet seat Toilet Transfers: 5-From toilet/BSC: Supervision (verbal cues/safety issues), 5-To toilet/BSC: Supervision (verbal cues/safety issues)  FIM - Banker Devices: Arm rests, HOB elevated Bed/Chair Transfer: 4: Supine > Sit: Min A (steadying Pt. > 75%/lift 1 leg), 5: Sit > Supine: Supervision (verbal cues/safety issues), 4: Bed > Chair or W/C: Min A (steadying Pt. > 75%), 4: Chair or W/C > Bed: Min A (steadying Pt. > 75%)  FIM - Locomotion: Wheelchair Locomotion: Wheelchair: 0: Activity did not occur FIM - Locomotion: Ambulation Locomotion: Ambulation  Assistive Devices: Other (comment) (none) Ambulation/Gait Assistance: 5: Supervision, 4: Min guard Locomotion: Ambulation: 4: Travels 150 ft or more with minimal assistance (Pt.>75%)  Comprehension Comprehension Mode: Auditory Comprehension: 3-Understands basic 50 - 74% of the time/requires cueing 25 - 50%  of the time  Expression Expression Mode: Verbal Expression: 3-Expresses basic 50 - 74% of the time/requires  cueing 25 - 50% of the time. Needs to repeat parts of sentences.  Social Interaction Social Interaction: 2-Interacts appropriately 25 - 49% of time - Needs frequent redirection.  Problem Solving Problem Solving: 1-Solves basic less than 25% of the time - needs direction nearly all the time or does not effectively solve problems and may need a restraint for safety  Memory Memory: 2-Recognizes or recalls 25 - 49% of the time/requires cueing 51 - 75% of the time  Medical Problem List and Plan: 1. Functional deficits secondary to TBI after a fall with left frontal, left temporal bleed and right cerebellar contusion as well as right basal skull fractures 2. DVT Prophylaxis/Anticoagulation: SCDs. Monitor for any signs of DVT 3. Pain Management: Tylenol as needed 4. Mood/bipolar disorder/dementia: Celexa 20 mg daily, Seroquel 300 mg twice a day, Ativan every 4 hours as needed. Fairly substantial dementia at baseline  -observe for mood swings or alterations in arousal--have not seen that so far here 5. Neuropsych: This patient is not capable of making decisions on her own behalf. 6. Skin/Wound Care: Routine skin checks 7. Fluids/Electrolytes/Nutrition: encouarge PO, RD follow up as well 8. Seizure disorder. Keppra 500 mg twice a day. Monitor for any seizure activity 9. Dysphagia. Dysphagia- honey thick liquids/ D1. Monitor hydration  -will stop ivf in anticipation of going home   -will have to see if she can go on her own without iv---will need ongoing supervision/encouragement to drink (husband)  -added megace. Seems to have actually helped!  -unfortunately her MBS did not show substantial improvement 10. Hypertension. Lasix 20 mg daily (hold), lisinopril 2.5 mg daily. Monitor with increased mobility  -recent labs normal.   -hold lasix 11. Urinary frequency. Having persistent elevated PVR's (perhaps a little better). DC'ed enablex. continue Flomax 0.4 mg daily at bedtime. Continue I/O cath  prn -Urine + 100k enterococcus: amoxil for 5 days  -may need to consider foley if no improvement  -  urecholine---increase to  tid   12.  Orthostatic Hypotension---flomax and lisinopril stopped---  LOS (Days) 13 A FACE TO FACE EVALUATION WAS PERFORMED                                                               SWARTZ,ZACHARY T 11/07/2014 8:43 AM

## 2014-11-08 ENCOUNTER — Inpatient Hospital Stay (HOSPITAL_COMMUNITY): Payer: Medicare Other

## 2014-11-08 ENCOUNTER — Inpatient Hospital Stay (HOSPITAL_COMMUNITY): Payer: Medicare Other | Admitting: Speech Pathology

## 2014-11-08 ENCOUNTER — Inpatient Hospital Stay (HOSPITAL_COMMUNITY): Payer: Medicare Other | Admitting: Physical Therapy

## 2014-11-08 LAB — BASIC METABOLIC PANEL
Anion gap: 10 (ref 5–15)
BUN: 15 mg/dL (ref 6–23)
CO2: 26 mmol/L (ref 19–32)
Calcium: 9 mg/dL (ref 8.4–10.5)
Chloride: 103 mmol/L (ref 96–112)
Creatinine, Ser: 0.57 mg/dL (ref 0.50–1.10)
GFR calc Af Amer: >90 mL/min (ref >90)
GFR calc non Af Amer: >90 mL/min (ref >90)
GLUCOSE: 89 mg/dL (ref 70–99)
POTASSIUM: 3.8 mmol/L (ref 3.5–5.1)
Sodium: 139 mmol/L (ref 135–145)

## 2014-11-08 NOTE — Progress Notes (Signed)
Speech Language Pathology Weekly Progress Note  Patient Details  Name: Sharon [name] MRN: [id number] Date of Birth: Sep 28, 1945  Beginning of progress report period: November 01, 2014 End of progress report period: November 08, 2014    Short Term Goals: Week 2: SLP Short Term Goal 1 (Week 2): Patient will orient to place, time and situation with use of external aids with Mod A question and verbal cues.  SLP Short Term Goal 1 - Progress (Week 2): Met SLP Short Term Goal 2 (Week 2): Patient will demonstrate functional problem solving for basic and familiar self-care tasks with Max A verbal cues.  SLP Short Term Goal 2 - Progress (Week 2): Met SLP Short Term Goal 3 (Week 2): Patient will sustain attention to a functional task for 5 minutes with Mod A verbal cues for redirection.  SLP Short Term Goal 3 - Progress (Week 2): Not met SLP Short Term Goal 4 (Week 2): Patient will consume current diet with minimal overt s/s of aspiration with Mod A verbal cues for use of swallowing compensatory strategies.  SLP Short Term Goal 4 - Progress (Week 2): Met SLP Short Term Goal 5 (Week 2): Patient will utilize speech intelligibility strategies with Max A verbal cues with 75% of observable opportunities to increase intelligibility to 75% at the phrase level.  SLP Short Term Goal 5 - Progress (Week 2): Met    New Short Term Goals: Week 3: SLP Short Term Goal 1 (Week 3): Patient will orient to place, time and situation with use of external aids with Min A question and verbal cues.  SLP Short Term Goal 2 (Week 3): Patient will demonstrate functional problem solving for basic and familiar self-care tasks with Mod A verbal cues.  SLP Short Term Goal 3 (Week 3): Patient will sustain attention to a functional task for 2 minutes with Mod A verbal cues for redirection.  SLP Short Term Goal 4 (Week 3): Patient will utilize speech intelligibility strategies with Mod A verbal cues with 75% of observable opportunities to  increase intelligibility to 75% at the phrase level.  SLP Short Term Goal 5 (Week 3): Patient will consume current diet with minimal overt s/s of aspiration with Min A verbal cues for use of swallowing compensatory strategies.   Weekly Progress Updates: Patient has made functional but inconsistent gains and has met 4 of 5 STG's this reporting period due to increased utilization of swallowing compensatory strategies, speech intelligibility, orientation with use of external aids and functional problem solving with familiar self-care tasks. Patient had MBS on 4/15, however, trials were limited due to behavioral issues, therefore, it was recommended patient continue current diet, however, patient is consuming her current diet with minimal overt s/s of aspiration with Mod A verbal cues for use of swallow strategies.  Patient's overall speech intelligibility at the phrase and sentence level has increased due to an increased vocal intensity and patient is ~75% intelligible in regards to expressing her basic wants/needs. Patient also demonstrates increased orientation to time with use of external aids and demonstrates increased ability to perform basic and familiar self-care tasks safely. Overall, patient's progress continues to be limited by decreased participation and endurance and overall lethargy.  Patient's husband has been educated in regards to the amount of care she requires at this time and that this clinician is concerned that he is unable to provide the level of care needed at this time, CSW is aware and addressing.  D/C planning is ongoing.  Patient would  benefit from continued skilled SLP intervention to maximize her swallowing, cognitive and speech function in order to maximize her overall functional independence prior to discharge.    Intensity: Minumum of 1-2 x/day, 30 to 90 minutes Frequency: 3 to 5 out of 7 days Duration/Length of Stay: TBD Treatment/Interventions: Cognitive  remediation/compensation;Cueing hierarchy;Dysphagia/aspiration precaution training;Therapeutic Activities;Speech/Language facilitation;Internal/external aids;Patient/family education;Functional tasks;Environmental controls    [place], Keith 11/08/2014, 4:03 PM

## 2014-11-08 NOTE — Progress Notes (Signed)
[facility]     PROGRESS NOTE    Subjective/Complaints: No new complaints this am. Had some fatigue yesterday reported by therapy/staff ROS limited by cognition  Objective: Vital Signs: Blood pressure 111/52, pulse 70, temperature 97.8 F (36.6 C), temperature source Oral, resp. rate 17, height  (1.626 m), weight 45.2 kg (99 lb 10.4 oz), SpO2 100 %. No results found. No results for input(s): WBC, HGB, HCT, PLT in the last 72 hours. No results for input(s): NA, K, CL, GLUCOSE, BUN, CREATININE, CALCIUM in the last 72 hours.  Invalid input(s): CO CBG (last 3)  No results for input(s): GLUCAP in the last 72 hours.  Wt Readings from Last 3 Encounters:  11/08/14 45.2 kg (99 lb 10.4 oz)  10/25/14 51.302 kg (113 lb 1.6 oz)  07/09/14 49.17 kg (108 lb 6.4 oz)    Physical Exam:  Constitutional:   No acute distress HENT:  Head: Normocephalic.  Eyes: EOM are normal. Pupils are equal, round, and reactive to light.     Neck: Normal range of motion. Neck supple. No tracheal deviation present. No thyromegaly present.  Cardiovascular: Normal rate and regular rhythm.  No murmur heard. Respiratory: Effort normal and breath sounds normal. No respiratory distress. She has no wheezes. She has no rales.  GI: Soft. Bowel sounds are normal. She exhibits no distension. There is no tenderness. There is no rebound.  Musculoskeletal: She exhibits tenderness. She exhibits no edema.  She moves all extremities  Neurological: Coordination abnormal.  Patient is alert flat effect. Follows simple commands. Oriented to name and hospital---not time,reason. Moves all 4's fairly equally with inconsistent effort. Skin: Skin is warm and dry.  Psychiatric:  Remains confused, non-agitated  Assessment/Plan: 1. Functional deficits secondary to TBI which require 3+ hours per day of interdisciplinary therapy in a comprehensive inpatient rehab setting. Physiatrist is  providing close team supervision and 24 hour management of active medical problems listed below. Physiatrist and rehab team continue to assess barriers to discharge/monitor patient progress toward functional and medical goals.  FIM: FIM - Bathing Bathing Steps Patient Completed: Right Arm, Left Arm, Right upper leg, Left upper leg, Abdomen, Chest Bathing: 2: Max-Patient completes 3-4 75f 10 parts or 25-49%  FIM - Upper Body Dressing/Undressing Upper body dressing/undressing steps patient completed: Thread/unthread right bra strap, Thread/unthread right sleeve of pullover shirt/dresss, Put head through opening of pull over shirt/dress, Thread/unthread left sleeve of pullover shirt/dress, Pull shirt over trunk, Thread/unthread left bra strap Upper body dressing/undressing: 3: Mod-Patient completed 50-74% of tasks FIM - Lower Body Dressing/Undressing Lower body dressing/undressing steps patient completed: Thread/unthread right underwear leg, Thread/unthread left underwear leg, Pull underwear up/down, Don/Doff left sock, Don/Doff right sock, Thread/unthread right pants leg, Pull pants up/down, Thread/unthread left pants leg, Don/Doff right shoe, Don/Doff left shoe Lower body dressing/undressing: 2: Max-Patient completed 25-49% of tasks  FIM - Toileting Toileting steps completed by patient: Adjust clothing prior to toileting, Adjust clothing after toileting, Performs perineal hygiene Toileting Assistive Devices: Grab bar or rail for support Toileting: 4: Steadying assist  FIM - Diplomatic Services operational officer Devices: Grab bars Toilet Transfers: 4-To toilet/BSC: Min A (steadying Pt. > 75%), 4-From toilet/BSC: Min A (steadying Pt. > 75%)  FIM - Bed/Chair Transfer Bed/Chair Transfer Assistive Devices: Arm rests, HOB elevated Bed/Chair Transfer: 4: Supine > Sit: Min A (steadying Pt. > 75%/lift 1 leg), 5: Sit > Supine: Supervision (verbal cues/safety issues), 4: Bed > Chair or W/C: Min A  (steadying Pt. >  75%), 4: Chair or W/C > Bed: Min A (steadying Pt. > 75%)  FIM - Locomotion: Wheelchair Locomotion: Wheelchair: 0: Activity did not occur FIM - Locomotion: Ambulation Locomotion: Ambulation Assistive Devices: Other (comment) (none) Ambulation/Gait Assistance: 5: Supervision, 4: Min guard Locomotion: Ambulation: 4: Travels 150 ft or more with minimal assistance (Pt.>75%)  Comprehension Comprehension Mode: Auditory Comprehension: 3-Understands basic 50 - 74% of the time/requires cueing 25 - 50%  of the time  Expression Expression Mode: Verbal Expression: 3-Expresses basic 50 - 74% of the time/requires cueing 25 - 50% of the time. Needs to repeat parts of sentences.  Social Interaction Social Interaction: 2-Interacts appropriately 25 - 49% of time - Needs frequent redirection.  Problem Solving Problem Solving: 1-Solves basic less than 25% of the time - needs direction nearly all the time or does not effectively solve problems and may need a restraint for safety  Memory Memory: 2-Recognizes or recalls 25 - 49% of the time/requires cueing 51 - 75% of the time  Medical Problem List and Plan: 1. Functional deficits secondary to TBI after a fall with left frontal, left temporal bleed and right cerebellar contusion as well as right basal skull fractures 2. DVT Prophylaxis/Anticoagulation: SCDs. Monitor for any signs of DVT 3. Pain Management: Tylenol as needed 4. Mood/bipolar disorder/dementia: Celexa 20 mg daily, Seroquel 300 mg twice a day, Ativan every 4 hours as needed. Fairly substantial dementia at baseline  -observe for mood swings or alterations in arousal--have not seen that so far here 5. Neuropsych: This patient is not capable of making decisions on her own behalf. 6. Skin/Wound Care: Routine skin checks 7. Fluids/Electrolytes/Nutrition: encouarge PO, RD follow up as well 8. Seizure disorder. Keppra 500 mg twice a day. Monitor for any seizure activity 9.  Dysphagia. Dysphagia- honey thick liquids/ D1. Monitor hydration  -will stop ivf in anticipation of going home   -will have to see if she can go on her own without iv---will need ongoing supervision/encouragement to drink (husband)  -added megace. Seems to have actually helped!  -unfortunately her MBS did not show substantial improvement 10. Hypertension. Lasix 20 mg daily (hold), lisinopril 2.5 mg daily. Monitor with increased mobility  -recent labs normal.   -holding lasix 11. Urinary frequency. PVR's better. Now off enablex. continue Flomax 0.4 mg daily at bedtime.   -No caths required yesterday   -Urine + 100k enterococcus: amoxil completed--recheck cx today   -  urecholine-- 25mg  tid---continue   12.  Orthostatic Hypotension---flomax and lisinopril stopped---  LOS (Days) 14 A FACE TO FACE EVALUATION WAS PERFORMED                                                               SWARTZ,ZACHARY T 11/08/2014 7:07 AM

## 2014-11-08 NOTE — Progress Notes (Signed)
Physical Therapy Weekly Progress Note  Patient Details  Name: Sharon [name] MRN: [id number] Date of Birth: [birth year]  Beginning of progress report period: November 01, 2014 End of progress report period: November 08, 2014  Today's Date: 11/08/2014 PT Individual Time:1030-1100 and 1430-1530 PT Individual Time Calculation (min): 30 min and 60 min   Patient has met 4 of 5 short term goals. Patient has demonstrated improved functional mobility and standing balance this reporting period with inconsistent cognitive gains including orientation, attention, and functional problem solving. For mobility, patient currently requires supervision to min guard due to decreased initiation and limited active participation as patient constantly requests to return to bed or refuse activities during therapy. Patient remains limited by decreased participation and limited activity tolerance. Patient's husband has been present for some therapy sessions which seems to improve patient's willingness to participate/ability to follow one step commands. Due to current level of care required by patient at this time, recommend further therapy at next level of care to improve cognitive function, endurance, and dynamic balance in order to maximize functional independence prior to discharge home. CSW aware and discharge planning ongoing.   Patient continues to demonstrate the following deficits: muscle weakness, decreased cardiorespiratory endurance, impaired timing and sequencing, abnormal tone, unbalanced muscle activation, decreased coordination and decreased motor planning, decreased initiation, decreased attention, decreased awareness, decreased problem solving, decreased safety awareness, decreased memory and delayed processing and therefore will continue to benefit from skilled PT intervention to enhance overall performance with activity tolerance, balance, postural control, ability to compensate for deficits, functional use of  right  upper extremity, right lower extremity, left upper extremity and left lower extremity, attention and awareness.  Patient progressing toward long term goals. Continue plan of care.  PT Short Term Goals Week 2:  PT Short Term Goal 1 (Week 2): Patient will perform bed mobility with supervision 50% of time.  PT Short Term Goal 1 - Progress (Week 2): Progressing toward goal (due to behavior/participation) PT Short Term Goal 2 (Week 2): Patient will transfer bed <> wheelchair with consistent min A. PT Short Term Goal 2 - Progress (Week 2): Met PT Short Term Goal 3 (Week 2): Patient will demonstrate dynamic standing balance x 5 min with consistent min A. PT Short Term Goal 3 - Progress (Week 2): Met PT Short Term Goal 4 (Week 2): Patient will demonstrate sustained attention to functional task x 60 sec with max cues.  PT Short Term Goal 4 - Progress (Week 2): Met PT Short Term Goal 5 (Week 2): Patient will demonstrate functional problem solving for basic mobility task with max cues.  PT Short Term Goal 5 - Progress (Week 2): Met Week 3:  PT Short Term Goal 1 (Week 3): Patient will perform bed mobility with supervision 50% of time.  PT Short Term Goal 2 (Week 3): Patient will transfer bed <> wheelchair with consistent S. PT Short Term Goal 3 (Week 3): Patient will demonstrate dynamic standing balance x 5 min with consistent S. PT Short Term Goal 4 (Week 3): Patient will demonstrate sustained attention to functional task x 60 sec with mod cues.  PT Short Term Goal 5 (Week 3): Patient will demonstrate functional problem solving for basic mobility task with mod cues.   Skilled Therapeutic Interventions/Progress Updates:   Session 1:  Focus on participation, initiation, sustained attention, and activity tolerance. Patient received asleep in bed, greeted therapist appropriately and required mod A to initiate getting OOB with therapist bringing BLE over edge of  bed. Patient ambulated throughout rehab unit  with supervision-min guard and actively refused all tasks. Of note, patient did not attempt to lie down during any seated rest breaks this session. Patient refused to bounce basketball but was able to kick with feet several times despite declining task. Seated at table, patient required max A cues to sort cards by color. Patient sustained attention to therapeutic conversation including topics of family and grandsons up to 2 min with supervision. Handoff to SLP at end of session.   Session 2: Focus on sustained attention, initiation and completion of tasks, functional ambulation in outdoor and community environments, dynamic balance, and activity tolerance. Patient received supine in bed, agreeable to getting dressed with setup assist for clothes. Patient transferred to edge of bed and donned shirt, bra and pants sitting edge of bed with distant supervision. Patient brushed hair with min cues but required assist for thoroughness. Patient required max encouragement to participate further in session. Patient disoriented to date and time. Patient required total A for problem solving how to get outside hospital and total cues for pushing buttons for elevators. Patient responded to all tasks with, "I don't want to!" but ambulated outdoors on inclines/declines, unlevel surfaces, and a variety of surfaces with supervision-min guard. After seated rest on outdoor bench, patient required total A and increased time x 15 minutes in order to return indoors as she escalated physically/verbally as demonstrated by screaming and wailing of arms when therapist attempted to assist patient up from bench. Patient required total A cues to return to rehab unit via elevator. Patient retrieved nectar thick orange juice from refrigerator with mod cues, total cues to utilize teaspoon and decrease pace in order to consumer 4 fl oz juice. RN student present to administer pain medication due to c/o "spine" pain. Patient returned to room with  min A cues and left sidelying in bed with 3 rails up and bed alarm on.    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

## 2014-11-08 NOTE — Progress Notes (Signed)
Speech Language Pathology Daily Session Note  Patient Details  Name: Sharon [name] MRN: [id number] Date of Birth: 1946-06-14  Today's Date: 11/08/2014 SLP Individual Time: 1100-1155 SLP Individual Time Calculation (min): 55 min  Short Term Goals: Week 3: SLP Short Term Goal 1 (Week 3): Patient will orient to place, time and situation with use of external aids with Min A question and verbal cues.  SLP Short Term Goal 2 (Week 3): Patient will demonstrate functional problem solving for basic and familiar self-care tasks with Mod A verbal cues.  SLP Short Term Goal 3 (Week 3): Patient will sustain attention to a functional task for 2 minutes with Mod A verbal cues for redirection.  SLP Short Term Goal 4 (Week 3): Patient will utilize speech intelligibility strategies with Mod A verbal cues with 75% of observable opportunities to increase intelligibility to 75% at the phrase level.  SLP Short Term Goal 5 (Week 3): Patient will consume current diet with minimal overt s/s of aspiration with Min A verbal cues for use of swallowing compensatory strategies.   Skilled Therapeutic Interventions: Skilled treatment session focused on engagement in therapy and subsequent cognitive goals. Patient received from PT and student facilitated session with Min A cues for use of swallowing strategies during limited trials of nectar-thick liquids via teaspoon and Dys. 1 textures, which patient consumed without overt s/s of aspiration but refused further PO trials. Patient ambulated back to room with SLP as assist with Mod A verbal cues for recall of room location. Patient completed basic self-care task with Mod A verbal cues for initiation; Max A verbal cues for thoroughness related to task were unsuccessful. Student further facilitated session with Mod A increasing to Max A verbal cues for sustained attention for ~20 minutes to functional task of answering basic biographic questions. Patient with increased speech  intelligibility of ~90% at the word level this session given Min A verbal cues. Patient left in bed with 3 siderails raised, bed alarm on, and all needs within reach. Continue with current plan of care.   FIM:  Comprehension Comprehension Mode: Auditory Comprehension: 4-Understands basic 75 - 89% of the time/requires cueing 10 - 24% of the time Expression Expression Mode: Verbal Expression: 3-Expresses basic 50 - 74% of the time/requires cueing 25 - 50% of the time. Needs to repeat parts of sentences. Social Interaction Social Interaction: 2-Interacts appropriately 25 - 49% of time - Needs frequent redirection. Problem Solving Problem Solving: 1-Solves basic less than 25% of the time - needs direction nearly all the time or does not effectively solve problems and may need a restraint for safety Memory Memory: 2-Recognizes or recalls 25 - 49% of the time/requires cueing 51 - 75% of the time FIM - Eating Eating Activity: 6: Assistive device: dentures;5: Supervision/cues;5: Needs verbal cues/supervision;6: More than reasonable amount of time;6: Swallowing techniques: self-managed  Pain Pain Assessment Pain Assessment: 0-10 Pain Score: No/Denies Pain  Therapy/Group: Individual Therapy  Tacey RuizKatherine Teddie Curd 11/08/2014, 4:30 PM

## 2014-11-08 NOTE — Progress Notes (Signed)
Occupational Therapy Session Note  Patient Details  Name: Sharon [name] MRN: [id number] Date of Birth: 02-24-1946  Today's Date: 11/08/2014 OT Individual Time: 0900-1000 OT Individual Time Calculation (min): 60 min    Short Term Goals: Week 2:  OT Short Term Goal 1 (Week 2): STG=LTG  Skilled Therapeutic Interventions/Progress Updates:    Pt resting in bed upon arrival and initially declined getting OOB but agreed to get up and use bathroom.  Pt amb with no AD (supervision) to bathroom but was unable to void.  Pt amb to sink and agreed to bathing tasks but persistently declined changing into clothing, stating that she was going to wear pajamas today.  Pt amb in room to assist with straightening up and placing dirty linens in soiled linen bag.  Pt required min verbal cues to actively participate.  Pt actively declined and resisted remaining in w/c at end of session and returned to bed.  Focus on active participation, activity tolerance, functional amb without AD, functional transfers, standing balance, and safety awareness.  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

## 2014-11-08 NOTE — Progress Notes (Signed)
Occupational Therapy Session Note  Patient Details  Name: Sharon [name] MRN: [id number] Date of Birth: 29-Apr-1946  Today's Date: 11/08/2014 OT Individual Time: [id number] OT Individual Time Calculation (min): 12 min    Short Term Goals: Week 2:  OT Short Term Goal 1 (Week 2): STG=LTG  Skilled Therapeutic Interventions/Progress Updates:    Pt seen for 1:1 OT session with focus on attention, functional mobility, and sequencing. Pt received supine in bed requesting to toilet. Ambulated bed>bathroom with min cues and SBA for balance. Pt unable to void, however managed clothing at supervision level for balance. Encouraged pt to don pajamas as she was requesting to return to bed. Pt retrieved pajamas and donned at supervision level. Pt asking therapist to "fix my bed." Provided pt with clean pillow cases and pt changed both in standing demonstrating sustained attention for approx 2 min without cues. Pt left supine in bed with all needs in reach.   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), sits without warning Restrictions Weight Bearing Restrictions: No General:   Vital Signs: Therapy Vitals Temp: 98.3 F (36.8 C) Temp Source: Oral Pulse Rate: 78 Resp: 18 BP: 116/66 mmHg Patient Position (if appropriate): Sitting Oxygen Therapy SpO2: 100 % O2 Device: Not Delivered Pain: Pain Assessment Pain Assessment: 0-10 Pain Score: 7  Pain Type: Acute pain Pain Location: Back Pain Orientation: Mid;Upper Pain Descriptors / Indicators: Aching Pain Onset: Unable to tell Pain Intervention(s): Medication (See eMAR) ADL:   Exercises:   Other Treatments:    See FIM for current functional status  Therapy/Group: Individual Therapy  Daneil Danerkinson, Azavion Bouillon N 11/08/2014, 4:49 PM

## 2014-11-09 ENCOUNTER — Inpatient Hospital Stay (HOSPITAL_COMMUNITY): Payer: Medicare Other

## 2014-11-09 ENCOUNTER — Inpatient Hospital Stay (HOSPITAL_COMMUNITY): Payer: Medicare Other | Admitting: Physical Therapy

## 2014-11-09 ENCOUNTER — Inpatient Hospital Stay (HOSPITAL_COMMUNITY): Payer: Medicare Other | Admitting: Speech Pathology

## 2014-11-09 NOTE — Progress Notes (Signed)
Physical Therapy Session Note  Patient Details  Name: Sharon [name] MRN: [id number] Date of Birth: [birth year]  Today's Date: 11/09/2014 PT Individual Time: 1630-1700 PT Individual Time Calculation (min): 30 min   Short Term Goals: Week 3:  PT Short Term Goal 1 (Week 3): Patient will perform bed mobility with supervision 50% of time.  PT Short Term Goal 2 (Week 3): Patient will transfer bed <> wheelchair with consistent S. PT Short Term Goal 3 (Week 3): Patient will demonstrate dynamic standing balance x 5 min with consistent S. PT Short Term Goal 4 (Week 3): Patient will demonstrate sustained attention to functional task x 60 sec with mod cues.  PT Short Term Goal 5 (Week 3): Patient will demonstrate functional problem solving for basic mobility task with mod cues.   Skilled Therapeutic Interventions/Progress Updates:    Pt received supine in bed, agreeable to participate in therapy after max multimodal encouragement. Pt ambulated to rehab gym w/ min cueing for route finding. Pt attended toseated balance task of chest pull with ball on string for 5 minutes before refusing to participate further. Attempted to have pt complete pipe tree puzzle, however pt placed first piece horizontally and could not problem solve placing initial piece into base. Pt found route back tyo room w/ min cueing, noted she was able to repeat her room number several times throughout session. Pt left seated in w/c at nurse's station to prepare for supervised dinner.   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), sits without warning Restrictions Weight Bearing Restrictions: No General:   Vital Signs: Therapy Vitals Temp: 98.2 F (36.8 C) Temp Source: Oral Pulse Rate: 66 Resp: 17 BP: 137/70 mmHg Patient Position (if appropriate): Lying Oxygen Therapy SpO2: 100 % O2 Device: Not Delivered Pain: Pain Assessment Pain  Assessment: 0-10 Pain Score: 5  Pain Type: Acute pain Pain Location: Generalized Pain Descriptors / Indicators: Aching Patients Stated Pain Goal: 4 Pain Intervention(s): Medication (See eMAR) Mobility:   Locomotion :    Trunk/Postural Assessment :    Balance:   Exercises:   Other Treatments:    See FIM for current functional status  Therapy/Group: Individual Therapy  Sharon [name], Sharon [name]  Sharon [name], PT, DPT 11/09/2014, 7:47 AM

## 2014-11-09 NOTE — Progress Notes (Signed)
NUTRITION FOLLOW UP  Pt meets criteria for SEVERE MALNUTRITION in the context of acute illness/injury as evidenced by a 10% weight loss in 9 days and moderate muscle mass loss.  DOCUMENTATION CODES Per approved criteria  -Severe malnutrition in the context of acute illness or injury -Underweight   INTERVENTION: Provide Ensure Pudding po TID, each supplement provides 170 kcal and 4 grams of protein.  Provide Magic cup TID between meals, each supplement provides 290 kcal and 9 grams of protein  Provide Ensure Enlive po BID PRN (thickened to honey thick), each supplement provides 350 kcal and 20 grams of protein.  Encourage adequate PO intake.  NUTRITION DIAGNOSIS: Malnutrition related to inadequate oral intake as evidenced by weight loss and moderate muscle mass loss; ongoing  Goal: Pt to meet >/= 90% of their estimated nutrition needs; progressing  Monitor:  PO intake, weight trends, labs, I/O's  69 y.o. 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 Presented 10/14/2014 after a fall with left frontal, L temporal bleed &R cerebellar contusion, R basal skull fxs,and post traumatic seizures.  Pt is currently on a dysphagia 1 diet with nectar thick liquids. Meal completion has been varied from 50-100%. Intake has been improving. Husband at bedside and is concerned pt is not getting enough fluids throughout the day. Husband reassured liquids are now nectar thick which is easier to consume fluids. Staff to continue encouragement of adequate fluid intake. Pt has been consuming her Ensure pudding. Will continue with current orders.   Labs and medications reviewed.  Height: Ht Readings from Last 1 Encounters:  10/25/14 5\' 4"  (1.626 m)    Weight: Wt Readings from Last 1 Encounters:  11/09/14 100 lb 8.5 oz (45.6 kg)  10/26/14 109 lbs  BMI:   Body mass index is 17.25 kg/(m^2). Underweight  Re-Estimated Nutritional Needs: Kcal: 1500-1700 Protein: 65-75 grams Fluid: >/= 1.5 L/day  Skin: Intact  Diet Order: DIET - DYS 1 Room service appropriate?: Yes; Fluid consistency:: Nectar Thick   Intake/Output Summary (Last 24 hours) at 11/09/14 1057 Last data filed at 11/09/14 0800  Gross per 24 hour  Intake    360 ml  Output      0 ml  Net    360 ml    Last BM: 4/17  Labs:   Recent Labs Lab 11/05/14 0617 11/08/14 0635  NA 135 139  K 4.1 3.8  CL 100 103  CO2 23 26  BUN 16 15  CREATININE 0.56 0.57  CALCIUM 8.8 9.0  GLUCOSE 94 89    CBG (last 3)  No results for input(s): GLUCAP in the last 72 hours.  Scheduled Meds: . antiseptic oral rinse  7 mL Mouth Rinse BID  . bethanechol  25 mg Oral TID  . cholecalciferol  1,000 Units Oral QHS  . citalopram  20 mg Oral QHS  . feeding supplement (ENSURE)  1 Container Oral TID BM  . ferrous sulfate  325 mg Oral BID WC  . levETIRAcetam  500 mg Oral BID  . megestrol  400 mg Oral BID  . pantoprazole  40 mg Oral Daily  . QUEtiapine  150 mg Oral BID  . cyanocobalamin  1,000 mcg Oral QHS  . zinc sulfate  220 mg Oral Daily    Continuous Infusions:    Past Medical History  Diagnosis Date  . Bipolar 1 disorder   . Dementia   .  Other pancytopenia 06/03/2014  . Stroke     Past Surgical History  Procedure Laterality Date  . Cholecystectomy      Marijean Niemann, MS, RD, LDN Pager # [phone] After hours/ weekend pager # [phone]

## 2014-11-09 NOTE — Progress Notes (Signed)
[facility] PHYSICAL MEDICINE & REHABILITATION     PROGRESS NOTE    Subjective/Complaints: Complaints of low back pain, mild headache. Slept well again. Eating better ROS limited by cognition  Objective: Vital Signs: Blood pressure 137/70, pulse 66, temperature 98.2 F (36.8 C), temperature source Oral, resp. rate 17, height  (1.626 m), weight 45.6 kg (100 lb 8.5 oz), SpO2 100 %. No results found. No results for input(s): WBC, HGB, HCT, PLT in the last 72 hours.  Recent Labs  11/08/14 0635  NA 139  K 3.8  CL 103  GLUCOSE 89  BUN 15  CREATININE 0.57  CALCIUM 9.0   CBG (last 3)  No results for input(s): GLUCAP in the last 72 hours.  Wt Readings from Last 3 Encounters:  11/09/14 45.6 kg (100 lb 8.5 oz)  10/25/14 51.302 kg (113 lb 1.6 oz)  07/09/14 49.17 kg (108 lb 6.4 oz)    Physical Exam:  Constitutional:   No acute distress HENT:  Head: Normocephalic.  Eyes: EOM are normal. Pupils are equal, round, and reactive to light.     Neck: Normal range of motion. Neck supple. No tracheal deviation present. No thyromegaly present.  Cardiovascular: Normal rate and regular rhythm.  No murmur heard. Respiratory: Effort normal and breath sounds normal. No respiratory distress. She has no wheezes. She has no rales.  GI: Soft. Bowel sounds are normal. She exhibits no distension. There is no tenderness. There is no rebound.  Musculoskeletal: She exhibits tenderness. She exhibits no edema.  She moves all extremities  Neurological: Coordination abnormal.  Patient is alert flat effect. Follows simple commands. Oriented to name and hospital---not time,reason. Moves all 4's fairly equally with inconsistent effort. Skin: Skin is warm and dry.  Psychiatric:  Remains confused, non-agitated  Assessment/Plan: 1. Functional deficits secondary to TBI which require 3+ hours per day of interdisciplinary therapy in a comprehensive inpatient rehab setting. Physiatrist is providing  close team supervision and 24 hour management of active medical problems listed below. Physiatrist and rehab team continue to assess barriers to discharge/monitor patient progress toward functional and medical goals.  FIM: FIM - Bathing Bathing Steps Patient Completed: Right Arm, Left Arm, Right upper leg, Left upper leg, Abdomen, Chest, Front perineal area, Buttocks Bathing: 4: Min-Patient completes 8-9 58f 10 parts or 75+ percent  FIM - Upper Body Dressing/Undressing Upper body dressing/undressing steps patient completed: Thread/unthread right sleeve of front closure shirt/dress, Thread/unthread left sleeve of front closure shirt/dress, Pull shirt around back of front closure shirt/dress, Button/unbutton shirt Upper body dressing/undressing: 5: Supervision: Safety issues/verbal cues FIM - Lower Body Dressing/Undressing Lower body dressing/undressing steps patient completed: Thread/unthread right underwear leg, Thread/unthread left underwear leg, Pull underwear up/down, Don/Doff left sock, Don/Doff right sock, Thread/unthread right pants leg, Pull pants up/down, Thread/unthread left pants leg, Don/Doff right shoe, Don/Doff left shoe Lower body dressing/undressing: 4: Steadying Assist  FIM - Toileting Toileting steps completed by patient: Adjust clothing prior to toileting, Adjust clothing after toileting, Performs perineal hygiene Toileting Assistive Devices: Grab bar or rail for support Toileting: 5: Supervision: Safety issues/verbal cues  FIM - Diplomatic Services operational officer Devices: Therapist, music Transfers: 5-To toilet/BSC: Supervision (verbal cues/safety issues), 5-From toilet/BSC: Supervision (verbal cues/safety issues)  FIM - Banker Devices: Arm rests, HOB elevated Bed/Chair Transfer: 3: Supine > Sit: Mod A (lifting assist/Pt. 50-74%/lift 2 legs, 4: Chair or W/C > Bed: Min A (steadying Pt. > 75%), 4: Bed > Chair or W/C: Min A  (  steadying Pt. > 75%)  FIM - Locomotion: Wheelchair Locomotion: Wheelchair: 0: Activity did not occur FIM - Locomotion: Ambulation Locomotion: Ambulation Assistive Devices: Other (comment) (none) Ambulation/Gait Assistance: 5: Supervision, 4: Min guard Locomotion: Ambulation: 4: Travels 150 ft or more with minimal assistance (Pt.>75%)  Comprehension Comprehension Mode: Auditory Comprehension: 4-Understands basic 75 - 89% of the time/requires cueing 10 - 24% of the time  Expression Expression Mode: Verbal Expression: 3-Expresses basic 50 - 74% of the time/requires cueing 25 - 50% of the time. Needs to repeat parts of sentences.  Social Interaction Social Interaction: 2-Interacts appropriately 25 - 49% of time - Needs frequent redirection.  Problem Solving Problem Solving: 1-Solves basic less than 25% of the time - needs direction nearly all the time or does not effectively solve problems and may need a restraint for safety  Memory Memory: 2-Recognizes or recalls 25 - 49% of the time/requires cueing 51 - 75% of the time  Medical Problem List and Plan: 1. Functional deficits secondary to TBI after a fall with left frontal, left temporal bleed and right cerebellar contusion as well as right basal skull fractures 2. DVT Prophylaxis/Anticoagulation: SCDs. Monitor for any signs of DVT 3. Pain Management: Tylenol as needed 4. Mood/bipolar disorder/dementia: Celexa 20 mg daily, Seroquel 300 mg twice a day, Ativan every 4 hours as needed. Fairly substantial dementia at baseline  -mood has been stable 5. Neuropsych: This patient is not capable of making decisions on her own behalf. 6. Skin/Wound Care: Routine skin checks 7. Fluids/Electrolytes/Nutrition: encouarge PO, RD follow up as well 8. Seizure disorder. Keppra 500 mg twice a day. Monitor for any seizure activity 9. Dysphagia. Dysphagia- honey thick liquids/ D1. Monitor hydration  -continues off IVF    -recheck bmet  tomorrow  -megace has helped  -unfortunately her MBS did not show substantial improvement 10. Hypertension. Lasix 20 mg daily (hold), lisinopril 2.5 mg daily. Monitor with increased mobility  -recent labs normal.   -holding lasix 11. Urinary frequency. PVR's better. (Now off enablex). continue Flomax 0.4 mg daily at bedtime.   -Urine + 100k enterococcus: amoxil completed--    -  urecholine-- 25mg  tid has been effective   12.  Orthostatic Hypotension---flomax and lisinopril stopped   LOS (Days) 15 A FACE TO FACE EVALUATION WAS PERFORMED                                                               Ida Uppal T 11/09/2014 8:01 AM

## 2014-11-09 NOTE — Progress Notes (Signed)
Occupational Therapy Session Note  Patient Details  Name: Sharon [name] MRN: [id number] Date of Birth: [birth year]  Today's Date: 11/09/2014 OT Individual Time: 1100-1200 OT Individual Time Calculation (min): 60 min    Short Term Goals: Week 2:  OT Short Term Goal 1 (Week 2): STG=LTG  Skilled Therapeutic Interventions/Progress Updates:    Pt asleep in bed upon arrival but easily aroused.  Husband initially present but departed after patient up in w/c.  Pt required max encouragement to participate in therapy.  Pt engaged in BADL retraining including bathing and dressing with sit<>stand from w/c at sink.  Pt required min verbal cues for task initiation and sequencing.  Pt amb without AD from room to therapy gym, ADL apartment, and day room and engaged in variety of standing tasks with focus on attention to task and activity tolerance.  Pt required multiple rest breaks and mod encouragement to continue to participate in therapy.  Pt returned to w/c with QRB in place and sitting at nurses station.  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), sits without warning Restrictions Weight Bearing Restrictions: No   Pain: Pain Assessment Pain Assessment: No/denies pain See FIM for current functional status  Therapy/Group: Individual Therapy  Rich BraveLanier, Tierre Gerard Chappell 11/09/2014, 12:17 PM

## 2014-11-09 NOTE — Progress Notes (Signed)
Speech Language Pathology Daily Session Note  Patient Details  Name: Sharon [name] MRN: [id number] Date of Birth: 02-09-1946  Today's Date: 11/09/2014 SLP Individual Time: 1430-1500 SLP Individual Time Calculation (min): 30 min  Short Term Goals: Week 3: SLP Short Term Goal 1 (Week 3): Patient will orient to place, time and situation with use of external aids with Min A question and verbal cues.  SLP Short Term Goal 1 - Progress (Week 3): Progressing toward goal SLP Short Term Goal 2 (Week 3): Patient will demonstrate functional problem solving for basic and familiar self-care tasks with Mod A verbal cues.  SLP Short Term Goal 2 - Progress (Week 3): Progressing toward goal SLP Short Term Goal 3 (Week 3): Patient will sustain attention to a functional task for 2 minutes with Mod A verbal cues for redirection.  SLP Short Term Goal 3 - Progress (Week 3): Progressing toward goal SLP Short Term Goal 4 (Week 3): Patient will utilize speech intelligibility strategies with Mod A verbal cues with 75% of observable opportunities to increase intelligibility to 75% at the phrase level.  SLP Short Term Goal 4 - Progress (Week 3): Progressing toward goal SLP Short Term Goal 5 (Week 3): Patient will consume current diet with minimal overt s/s of aspiration with Min A verbal cues for use of swallowing compensatory strategies.  SLP Short Term Goal 5 - Progress (Week 3): Progressing toward goal  Skilled Therapeutic Interventions: Pt was seen for skilled treatment session focusing on trials of upgraded diet, education of pt/staff regarding MBS results, and cognitive goals. [facility] and reviewed with RN. SLP facilitated session with Mod cues for use of swallowing strategies during trials of nectar-thick liquids, which patient consumed without overt s/s of aspiration. Pt required Mod - Max visual and verbal cues to use information in room for orientation to time. Pt  demonstrated  sustained attention for ~10 minutes to functional task of answering basic biographic questions about her husband and son.   FIM:  Comprehension Comprehension Mode: Auditory Comprehension: 3-Understands basic 50 - 74% of the time/requires cueing 25 - 50%  of the time Expression Expression Mode: Verbal Expression: 3-Expresses basic 50 - 74% of the time/requires cueing 25 - 50% of the time. Needs to repeat parts of sentences. Social Interaction Social Interaction: 2-Interacts appropriately 25 - 49% of time - Needs frequent redirection. Problem Solving Problem Solving: 1-Solves basic less than 25% of the time - needs direction nearly all the time or does not effectively solve problems and may need a restraint for safety Memory Memory: 2-Recognizes or recalls 25 - 49% of the time/requires cueing 51 - 75% of the time FIM - Eating Eating Activity: 5: Set-up assist for cut food;5: Needs verbal cues/supervision;5: Set-up assist for apply device (including dentures)  Pain Pain Assessment Pain Assessment: Faces Faces Pain Scale: Hurts little more Pain Location: Back  Therapy/Group: Individual Therapy   Celia B. GardenBueche, Interstate Ambulatory Surgery CenterMSP, CCC-SLP [phone]  Leigh AuroraBueche, Celia Brown 11/09/2014, 3:41 PM

## 2014-11-09 NOTE — Progress Notes (Addendum)
Physical Therapy Session Note  Patient Details  Name: Sharon [name] MRN: [id number] Date of Birth: 09-Nov-1945  Today's Date: 11/09/2014 PT Individual Time: 0805-0830 PT Individual Time Calculation (min): 25 min   Short Term Goals: Week 3:  PT Short Term Goal 1 (Week 3): Patient will perform bed mobility with supervision 50% of time.  PT Short Term Goal 2 (Week 3): Patient will transfer bed <> wheelchair with consistent S. PT Short Term Goal 3 (Week 3): Patient will demonstrate dynamic standing balance x 5 min with consistent S. PT Short Term Goal 4 (Week 3): Patient will demonstrate sustained attention to functional task x 60 sec with mod cues.  PT Short Term Goal 5 (Week 3): Patient will demonstrate functional problem solving for basic mobility task with mod cues.   Skilled Therapeutic Interventions/Progress Updates:   Patient received sitting in wheelchair, RN providing supervision for breakfast meal. Focus on task initiation, attention, standing balance, activity tolerance. Patient consumed honey thick liquid and minimal amount of yogurt and magic cup with mod verbal cues to utilize teaspoon and decrease pace. Patient required min questioning cues to initiate using bathroom, washing hands, brushing hair, and dressing with sit <> stand from wheelchair. Patient required supervision/setup assist to complete all above tasks, no cues needed for encouragement or attention to task. Patient left sitting in wheelchair with quick release belt on, RN present to administer medications.   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), sits without warning Restrictions Weight Bearing Restrictions: No Pain: Pain Assessment Pain Assessment: No/denies pain Locomotion : Ambulation Ambulation/Gait Assistance: 5: Supervision   See FIM for current functional status  Therapy/Group: Individual Therapy  Kerney ElbeVarner,  Tiannah Greenly A 11/09/2014, 9:03 AM

## 2014-11-09 NOTE — Procedures (Signed)
Objective Swallowing Evaluation: Modified Barium Swallowing Study  Patient Details  Name: Sharon [name] MRN: [id number] Date of Birth: 1945/10/08  Today's Date: 11/09/2014 Time:  -     Past Medical History:  Past Medical History  Diagnosis Date  . Bipolar 1 disorder   . Dementia   . Other pancytopenia 06/03/2014  . Stroke    Past Surgical History:  Past Surgical History  Procedure Laterality Date  . Cholecystectomy     HPI:  Pt is a 69 y/o female with PMH of advanced dementia and bipolar disorder who came in to [facility] following a fall at home and seizure activity. CT revealed hemorrhagic contusion in the R cerebellum. Pt had MBS on 10/21/13 and recommended Dys. 1 textures with honey-thick liquids via tsp. Patient transferred to CIR on 10/25/14 and has had intermittent participation in therapy with limited gains. MBS 11/05/14 completed, however, limited study due to poor cooperation/participation. Repeating MBS this date with husband Film/video editor) present, to assess for possible upgrade.      Recommendation/Prognosis  Clinical Impression:   Clinical impression: Repeat MBS completed today, with improved participation by pt.  Husband was present, which undoubtedly facilitated pt's cooperation.  Pt continues to present with moderate oropharyngeal dysphagia characterized by reduced tongue base retraction, resulting in trace vallecular residue across consistencies.  Delayed swallow initiation to the vallecular and pyriform sinuses noted due to decreased sensation for primary trigger site.  Frank aspiration of thin liquid was noted during the swallow, with weak ineffective cough response observed. Trace penetration was noted on teaspoon boluses of thin liquid.  Thin liquids are not recommended at this time. Pt tolerated nectar thick consistency with trace flash penetration noted only during large and/or consecutive swallows.  Small individual sips of nectar thick liquid were tolerated well, via cup and  straw. Puree consistency was tolerated well without penetration, aspiration or significant post-swallow residue. Pt was given mixed consistency (graham cracker and puree).  Insufficient mastication was noted, with pt swallowing the bolus poorly chewed. Barium tablet was given with nectar thick liquid, and was noted to stop at the level of the vallecular. Multiple boluses of puree were required to clear pill. Esophageal sweep following the barium tablet revealed slow clearing of the esophagus. Pt performance was improved today as compared to previous MBSs, however, given presentation, puree diet continues to be recommended. Pt appears safe to advance to nectar thick liquids, but crushed meds would be the safest method of med administration.  Full supervision during po intake continues to be appropriate, due to pt tendency toward impulsivity.  Pt will likely require ongoing cues for small bites and slow rate, and should remain upright for 20-30 minutes after po intake due to slow esophageal clearing.   Swallow Evaluation Recommendations:  Diet Recommendations: Dysphagia 1 (Puree);Nectar-thick liquid Liquid Administration via: Cup;Straw Medication Administration: Crushed with puree Supervision: Patient able to self feed;Full supervision/cueing for compensatory strategies Compensations: Slow rate;Small sips/bites;Check for pocketing;Clear throat intermittently Postural Changes and/or Swallow Maneuvers: Seated upright 90 degrees;Upright 30-60 min after meal Oral Care Recommendations: Oral care BID Other Recommendations: Order thickener from pharmacy;Remove water pitcher Follow up Recommendations: 24 hour supervision/assistance;Skilled Nursing facility    Prognosis:  Prognosis for Safe Diet Advancement: Fair Barriers to Reach Goals: Severity of dysphagia;Behavior;Motivation   Individuals Consulted: Consulted and Agree with Results and Recommendations: Patient;RN;Family member/caregiver Family Member  Consulted: husband, Ray      SLP Assessment/Plan  Plan:  Speech Therapy Frequency (ACUTE ONLY): min 3x week Potential to  Achieve Goals (ACUTE ONLY): Fair Potential Considerations (ACUTE ONLY): Previous level of function;Ability to learn/carryover information;Family/community support;Cooperation/participation level   Short Term Goals: Week 3: SLP Short Term Goal 1 (Week 3): Patient will orient to place, time and situation with use of external aids with Min A question and verbal cues.  SLP Short Term Goal 1 - Progress (Week 3): Progressing toward goal SLP Short Term Goal 2 (Week 3): Patient will demonstrate functional problem solving for basic and familiar self-care tasks with Mod A verbal cues.  SLP Short Term Goal 2 - Progress (Week 3): Progressing toward goal SLP Short Term Goal 3 (Week 3): Patient will sustain attention to a functional task for 2 minutes with Mod A verbal cues for redirection.  SLP Short Term Goal 3 - Progress (Week 3): Progressing toward goal SLP Short Term Goal 4 (Week 3): Patient will utilize speech intelligibility strategies with Mod A verbal cues with 75% of observable opportunities to increase intelligibility to 75% at the phrase level.  SLP Short Term Goal 4 - Progress (Week 3): Progressing toward goal SLP Short Term Goal 5 (Week 3): Patient will consume current diet with minimal overt s/s of aspiration with Min A verbal cues for use of swallowing compensatory strategies.  SLP Short Term Goal 5 - Progress (Week 3): Progressing toward goal      General: Date of Onset: 10/14/14 Type of Study: Modified Barium Swallowing Study Reason for Referral: Objectively evaluate swallowing function Previous Swallow Assessment: MBSS done 3/31, 4/15 with recommendation for continued Dys.1 textures and honey-thick liquids via teaspoon Diet Prior to this Study: Honey-thick liquids;Dysphagia 1 (puree) Temperature Spikes Noted: No Respiratory Status: Room air History of Recent  Intubation: No Behavior/Cognition: Requires cueing;Impulsive;Distractible;Decreased sustained attention;Alert;Cooperative;Pleasant mood Oral Cavity - Dentition: Edentulous (has dentures, but they are ill-fitting due to significant weight loss.) Oral Motor / Sensory Function: Impaired - see Bedside swallow eval Self-Feeding Abilities: Able to feed self Patient Positioning: Upright in chair Baseline Vocal Quality: Clear Volitional Cough: Weak Volitional Swallow: Able to elicit Pharyngeal Secretions: Not observed secondary MBS   Reason for Referral:   Objectively evaluate swallowing function    Oral Phase: Oral Preparation/Oral Phase Oral Phase: WFL Oral - Honey Oral - Honey Teaspoon: Not tested Oral - Honey Cup: Not tested Oral - Nectar Oral - Nectar Teaspoon: Not tested Oral - Nectar Cup: Lingual pumping Oral - Nectar Straw: Lingual pumping Oral - Thin Oral - Thin Teaspoon: Lingual pumping Oral - Thin Cup: Lingual pumping Oral - Thin Straw: Not tested Oral - Solids Oral - Puree: Lingual pumping Oral - Regular: Lingual pumping Oral - Multi-consistency: Lingual pumping;Impaired mastication;Delayed oral transit Oral - Pill: Lingual pumping   Pharyngeal Phase:  Pharyngeal Phase Pharyngeal Phase: Impaired Pharyngeal - Honey Pharyngeal - Honey Teaspoon: Not tested Pharyngeal - Honey Cup: Not tested Pharyngeal - Nectar Pharyngeal - Nectar Teaspoon: Not tested Pharyngeal - Nectar Cup: Delayed swallow initiation;Premature spillage to pyriform sinuses;Premature spillage to valleculae;Reduced tongue base retraction;Pharyngeal residue - valleculae;Penetration/Aspiration during swallow Penetration/Aspiration details (nectar cup): Material enters airway, remains ABOVE vocal cords then ejected out (trace flash penetration seen only with large, consecutive boluses.) Pharyngeal - Nectar Straw: Not tested Pharyngeal - Thin Pharyngeal - Thin Teaspoon: Delayed swallow initiation;Premature  spillage to pyriform sinuses;Premature spillage to valleculae;Reduced tongue base retraction;Pharyngeal residue - valleculae;Penetration/Aspiration during swallow Penetration/Aspiration details (thin teaspoon): Material enters airway, remains ABOVE vocal cords and not ejected out Pharyngeal - Thin Cup: Delayed swallow initiation;Premature spillage to pyriform sinuses;Premature spillage to valleculae;Reduced tongue  base retraction;Pharyngeal residue - valleculae;Penetration/Aspiration during swallow Penetration/Aspiration details (thin cup): Material enters airway, passes BELOW cords and not ejected out despite cough attempt by patient Pharyngeal - Solids Pharyngeal - Puree: Delayed swallow initiation;Premature spillage to pyriform sinuses;Premature spillage to valleculae;Reduced tongue base retraction;Pharyngeal residue - valleculae Pharyngeal - Regular: Not tested Pharyngeal - Multi-consistency: Delayed swallow initiation;Premature spillage to pyriform sinuses;Premature spillage to valleculae;Reduced tongue base retraction;Pharyngeal residue - valleculae Pharyngeal - Pill: Delayed swallow initiation;Premature spillage to valleculae;Reduced tongue base retraction;Pharyngeal residue - valleculae;Other (Comment) (barium tablet stopped at vallecular sinus and required multliple boluses of puree to clear pill.)   Cervical Esophageal Phase  Cervical Esophageal Phase Cervical Esophageal Phase: WFL   GN        Celia B. ChalfantBueche, St Anthony North Health CampusMSP, CCC-SLP [phone]  Leigh AuroraBueche, Celia Brown 11/09/2014, 11:42 AM

## 2014-11-09 NOTE — Progress Notes (Signed)
Occupational Therapy Note  Patient Details  Name: Sharon [name] MRN: [id number] Date of Birth: [birth year]  Today's Date: 11/09/2014 OT Individual Time: 1330-1400 OT Individual Time Calculation (min): 30 min   Pt denies pain this afternoon Individual Therapy  Pt initially engaged in toilet transfers and toileting.  Pt amb without AD to bathroom and performed all bathing tasks with supervision.  Pt required min verbal cues to walk to sink to wash hands.  Pt remained in room and assisted with gathering extra linens and placing them in dirty linen bag.  Pt required min encouragement to continue active participation.  Focus on activity tolerance, functional amb without AD, standing balance, task initiation, and safety awareness.   Lavone NeriLanier, Danett Palazzo [facility] CenterChappell 11/09/2014, 2:28 PM

## 2014-11-10 ENCOUNTER — Inpatient Hospital Stay (HOSPITAL_COMMUNITY): Payer: Medicare Other | Admitting: Speech Pathology

## 2014-11-10 ENCOUNTER — Inpatient Hospital Stay (HOSPITAL_COMMUNITY): Payer: Medicare Other

## 2014-11-10 ENCOUNTER — Inpatient Hospital Stay (HOSPITAL_COMMUNITY): Payer: Medicare Other | Admitting: Occupational Therapy

## 2014-11-10 LAB — BASIC METABOLIC PANEL
ANION GAP: 8 (ref 5–15)
BUN: 12 mg/dL (ref 6–23)
CHLORIDE: 105 mmol/L (ref 96–112)
CO2: 25 mmol/L (ref 19–32)
Calcium: 9.3 mg/dL (ref 8.4–10.5)
Creatinine, Ser: 0.59 mg/dL (ref 0.50–1.10)
GFR calc Af Amer: >90 mL/min (ref >90)
Glucose, Bld: 90 mg/dL (ref 70–99)
POTASSIUM: 4.2 mmol/L (ref 3.5–5.1)
SODIUM: 138 mmol/L (ref 135–145)

## 2014-11-10 NOTE — Progress Notes (Signed)
Social Work Patient ID: Sharon [name], female   DOB: 10/14/1945, 68 y.o.   MRN: [id number]   Met with pt's husband yesterday for further discuss d/c plans.  Husband continues to be concerned about his abilities to provide 24/7 care for pt at home.  After much discussion about his options, he decided to change plan to SNF at least for a short stay.  Have sent FL2 out to area facilities and will keep team posted on offers.  , , LCSW  

## 2014-11-10 NOTE — Progress Notes (Signed)
Speech Language Pathology Daily Session Note  Patient Details  Name: Sharon [name] MRN: [id number] Date of Birth: 14-Aug-1945  Today's Date: 11/10/2014 SLP Session 1 Individual Time: 1000-1030 SLP Session 1 Individual Time Calculation (min): 30 min  SLP Session 2 Individual Time: 1478-2956 SLP Session 2 Individual Time Calculation (min): 30 min  Short Term Goals: Week 3: SLP Short Term Goal 1 (Week 3): Patient will orient to place, time and situation with use of external aids with Min A question and verbal cues.  SLP Short Term Goal 1 - Progress (Week 3): Progressing toward goal SLP Short Term Goal 2 (Week 3): Patient will demonstrate functional problem solving for basic and familiar self-care tasks with Mod A verbal cues.  SLP Short Term Goal 2 - Progress (Week 3): Progressing toward goal SLP Short Term Goal 3 (Week 3): Patient will sustain attention to a functional task for 2 minutes with Mod A verbal cues for redirection.  SLP Short Term Goal 3 - Progress (Week 3): Progressing toward goal SLP Short Term Goal 4 (Week 3): Patient will utilize speech intelligibility strategies with Mod A verbal cues with 75% of observable opportunities to increase intelligibility to 75% at the phrase level.  SLP Short Term Goal 4 - Progress (Week 3): Progressing toward goal SLP Short Term Goal 5 (Week 3): Patient will consume current diet with minimal overt s/s of aspiration with Min A verbal cues for use of swallowing compensatory strategies.  SLP Short Term Goal 5 - Progress (Week 3): Progressing toward goal  Skilled Therapeutic Interventions: Session 1: Skilled treatment session focused on patient engagement in therapeutic tasks and subsequent cognitive goals. Patient received upright in w/c with quick-release belt donned from nurse's station after RN administered medication. Patient reporting pain in hip and educated regarding role of medication just recevied from RN. Student facilitated session with  multiple basic sorting and scanning tasks, for which patient required Max-Total encouragement for participation and Max-Total A for sustained attention for ~20 seconds in a mildly distracting environment. Patient furthermore required Max-Total A for recall of previous therapy sessions and Max A for use of external aid to orient to time. Patient continues to demonstrate increased intelligibility of 75-90% at the phrase level. Patient returned to nurse's station upright in w/c with quick-release belt in place. Continue with current plan of care.   Session 2: Skilled treatment session focused on dysphagia goals. Patient received upright in w/c with quick-release belt donned from nurse's station. Student facilitated session through skilled observation of lunchtime meal of Dys. 1 textures and nectar-thick liquids, which patient consumed with subtle overt s/s of aspiration x1 characterized by very subtle throat clear immediately following larger bolus of nectar-thick liquids. Patient required Min-Mod A verbal, visual, and tactile cues for use of swallowing strategies, Min A verbal and visual cues for basic problem-solving associated with tray set-up, and maintained selective attention to functional task of self-feeding for ~2 minutes with Min A verbal and visual cues. Patient returned in w/c with quick-release belt donned to nurse's station. Continue with current plan of care.  FIM:  Comprehension Comprehension Mode: Auditory Comprehension: 3-Understands basic 50 - 74% of the time/requires cueing 25 - 50%  of the time Expression Expression Mode: Verbal Expression: 3-Expresses basic 50 - 74% of the time/requires cueing 25 - 50% of the time. Needs to repeat parts of sentences. Social Interaction Social Interaction: 1-Interacts appropriately less than 25% of the time. May be withdrawn or combative. Problem Solving Problem Solving: 1-Solves basic less  than 25% of the time - needs direction nearly all the time or  does not effectively solve problems and may need a restraint for safety Memory Memory: 1-Recognizes or recalls less than 25% of the time/requires cueing greater than 75% of the time FIM - Eating Eating Activity: 5: Needs verbal cues/supervision;6: More than reasonable amount of time;4: Helper checks for pocketed food  Pain Pain Assessment Pain Location: Hip Pain Intervention(s): Medication (See eMAR);RN recently administered pain medication  Therapy/Group: Individual Therapy  Tacey RuizKatherine Wyett Narine 11/10/2014, 12:06 PM

## 2014-11-10 NOTE — Progress Notes (Signed)
Plan of Care Note   Pt's plan of care adjusted to QD daily therapies after speaking with care team and discussed with PA as pt currently unable to tolerate current therapy schedule with OT, PT, and SLP.    Feliberto Gottronourtney Nel Stoneking, KentuckyMA, CCC-SLP [phone]

## 2014-11-10 NOTE — Progress Notes (Signed)
Occupational Therapy Session Note  Patient Details  Name: Sharon [name] MRN: [id number] Date of Birth: 13-Apr-1946  Today's Date: 11/10/2014 OT Individual Time: 1430-1500 OT Individual Time Calculation (min): 30 min   Skilled Therapeutic Interventions/Progress Updates:    1:1 Co treat with PT with focus on following one step directions, making choices from two choices, participate in familiar therapeutic tasks, standing balance, path finding, activity tolerance/ endurance, initiation and ability to complete a task etc. Participated in simple kitchen task of making pudding including retrieving items to make it, eating it, putting it away and cleaning up. Pt required max to total encouragement to participate in completing parts of task or talking about task.  Pt performed better with a partner-ship approach.  Pt able to follow one step directions when willing  To participate. Pt demonstrated functional ambulation while carrying one item with supervision. Pt heavily relies on therapists to guide her directionally around the unit; however when given encouragement and extra time pt able to find RN station, fridge at RN station and her room. Pt able to demonstrate toileting and toilet transfer with supervision with cuing for initiation. Pt did not initiate asking to use the bathroom but when asked due to fidgeting pt did report the need to go to the bathroom.  Pt navigated to [employer] with max cues for directions.  Rested on furniture and participate in therapeutic social conversation about likes and activities of enjoyment, orientation and biographical information. With mod questioning cues patient performed path finding back to rehab unit. Returned to room and bed with supervision.    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 See FIM for current functional status  Therapy/Group: Individual Therapy/ co treat  Roney MansSmith, [name] 11/10/2014, 4:08 PM

## 2014-11-10 NOTE — Patient Care Conference (Signed)
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 Urine retention 11/03/2014  . Dysphagia, pharyngoesophageal phase 11/03/2014  . 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 Frontal lobe contusion   . Hypoxemia 10/21/2014  . Pulmonary edema   . Intracerebral hemorrhage 10/20/2014  . Intracranial hemorrhage 10/14/2014  . Other pancytopenia 06/03/2014    Expected Discharge Date: Expected Discharge Date:  (SNF)  Team Members Present: Physician leading conference: Dr. Faith Rogue Social Worker Present: Amada Jupiter, LCSW Nurse Present: Chana Bode, RN PT Present: Bayard Hugger, PT OT Present: Ardis Rowan, COTA;Jennifer Fredrich Romans, OT SLP Present: Feliberto Gottron, SLP Other (Discipline and Name): Ottie Glazier, RN [facility]) PPS Coordinator present : Tora Duck, RN, [facility]     Current Status/Progress Goal Weekly Team Focus  Medical   eating better. voiding. diet upgraded today  advance diet  nutrition, dysphagia, sleep pattersns   Bowel/Bladder   Pt voiding with low PVRs. timed toileting inplace. Brief used for incont episodes at times. LBM 4-17  Manage bowel and bladder min assist  cont. timed toileting   Swallow/Nutrition/ Hydration   Dys. 1 textures with nectar-thick liquids via cup; Max A for use of swallow strategies   Mod A for use of swallow strategies  increase use of swallow strategies, tolerance of current diet   ADL's   decreased activity tolerance/arousal; min A overall; occasional steady A for functional transfers  min assist overall and supervision UB self-care   attention, initiation, cognitive remediation, functional transfers, standing balance, activity tolerance   Mobility   Supervision-min guard   supervision for mobility, min-mod A for cognition  initiation, sustained attention, orientation, alertness, dynamic balance, activity tolerance, patient/family education   Communication   Min A at phrase level  Min A at phrase level  continue to increase verbal expression   Safety/Cognition/ Behavioral Observations  Mod-Max A  overall Min A with Mod A for attention  arousal, sustained attention, initiation, familiar problem-solving, family education   Pain   no complinats of pain         Skin   no skin breakdown. foam to sacrum  patient will remain free from skin breakdown while in rehab min assist  assess skin q shift    Rehab Goals Patient on target to meet rehab goals: Yes *See Care Plan and progress notes for long and short-term goals.  Barriers to Discharge: ongoing cognitive and behavioral deficits    Possible Resolutions to Barriers:  supervision at home    Discharge Planning/Teaching Needs:  plan has changed to SNF as husband does not feel he can meet care needs      Team Discussion:  SW reports plan has changed to SNF.  Performed a follow up MBS with liquids upgraded to nectar.  Better po intake overall but no change in  cognition.  Revisions to Treatment Plan:  Change in d/c plan   Continued Need for Acute Rehabilitation Level of Care: The patient requires daily medical management by a physician with specialized training in physical medicine and rehabilitation for the following conditions: Daily direction of a multidisciplinary physical rehabilitation program to ensure safe  treatment while eliciting the highest outcome that is of practical value to the patient.: Yes Daily medical management of patient stability for increased activity during participation in an intensive rehabilitation regime.: Yes Daily analysis of laboratory values and/or radiology reports with any subsequent need for medication adjustment of medical intervention for : Post surgical problems;Neurological problems  Jo Booze 11/10/2014, 3:10 PM

## 2014-11-10 NOTE — Progress Notes (Signed)
Physical Therapy Session Note  Patient Details  Name: Sharon [name] MRN: [id number] Date of Birth: August 03, 1945  Today's Date: 11/10/2014 PT Co-treatment Time: 1400-1430    30 min (full co tx 60 min)   Short Term Goals: Week 3:  PT Short Term Goal 1 (Week 3): Patient will perform bed mobility with supervision 50% of time.  PT Short Term Goal 2 (Week 3): Patient will transfer bed <> wheelchair with consistent S. PT Short Term Goal 3 (Week 3): Patient will demonstrate dynamic standing balance x 5 min with consistent S. PT Short Term Goal 4 (Week 3): Patient will demonstrate sustained attention to functional task x 60 sec with mod cues.  PT Short Term Goal 5 (Week 3): Patient will demonstrate functional problem solving for basic mobility task with mod cues.   Skilled Therapeutic Interventions/Progress Updates:   Patient seen for skilled co-treatment with OT to address task initiation, participation in therapeutic tasks, path finding, standing balance, and activity tolerance. Patient received asleep in bed, greeted therapist appropriately but initially declined OOB activity. Therapist assisted patient to edge of bed with max encouragement and total A due to decreased participation. Patient retrieved nectar thick liquid from refrigerator at RN station, required max cues for swallowing strategies, and ambulated to ADL apartment while carrying cup with supervision-min guard. Patient required max-total encouragement for participation in basic kitchen task of making pudding. Patient followed simple one step commands < 25% of time and refused to participate further in assisting with kitchen task. With min questioning cues, patient reported need for bathroom but was unable to void. Patient performed clothing management and toilet transfer with supervision and required min questioning cues for initiating and sequencing hand hygiene. Therapist prepared pudding for patient who then consumed ~50% of pudding and  required mod cues to carry to refrigerator, transfer pudding to different cup, and carry cup back to ADL kitchen to rinse out. Patient with inconsistent answers when given choice of 2 tasks and required mod-max encouragement to ambulate [facility]. Seated on furniture with eyes closed, patient engaged in therapeutic conversation with focus on orientation and biographical information. With mod questioning cues patient performed path finding back to rehab unit and to room. Patient left sidelying in bed with bed alarm on and 3 rails up, needs within reach.   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

## 2014-11-10 NOTE — Progress Notes (Signed)
Occupational Therapy Weekly Progress Note  Patient Details  Name: Kween L Proby MRN: [id number] Date of Birth: Feb 05, 1946  Beginning of progress report period: November 03, 2014 End of progress report period: November 10, 2014   Pt continues to exhibit inconsistent performance with BADLs.  Pt has performed BADLs with supervision approx 25% during the past week.  Pt consistently requires max encouragement to participate in therapy.  Pt's husband has been present and assisted patient with bathing and dressing.  Pt's husband provided max A when assisting with BADLs.    Patient continues to demonstrate the following deficits: decreased strength, decreased activity tolerance, decreased standing balance, decreased insight into deficits, decreased memory, decreased safety awareness, decreased attention, decreased awareness, decreased initiation, decreased problem solving, decreased coordination, and delayed processing  and therefore will continue to benefit from skilled OT intervention to enhance overall performance with BADLs, activity tolerane, balance, strength, attention, safety, awareness, ability to compensate for deficits.  Patient progressing toward long term goals..  Plan of care revisions: Patient to discharge to SNF.  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

## 2014-11-10 NOTE — Progress Notes (Signed)
Physical Therapy Session Note  Patient Details  Name: Sharon [name] MRN: [id number] Date of Birth: 12-07-1945  Today's Date: 11/10/2014 PT Individual Time: 1600-1640 PT Individual Time Calculation (min): 40 min   Short Term Goals: Week 3:  PT Short Term Goal 1 (Week 3): Patient will perform bed mobility with supervision 50% of time.  PT Short Term Goal 2 (Week 3): Patient will transfer bed <> wheelchair with consistent S. PT Short Term Goal 3 (Week 3): Patient will demonstrate dynamic standing balance x 5 min with consistent S. PT Short Term Goal 4 (Week 3): Patient will demonstrate sustained attention to functional task x 60 sec with mod cues.  PT Short Term Goal 5 (Week 3): Patient will demonstrate functional problem solving for basic mobility task with mod cues.   Skilled Therapeutic Interventions/Progress Updates:    Pt received supine in bed, agreeable to participate in therapy with max encouragement after therapist moved legs out of bed. Pt engaged of card game of war for 5' with pt answering questions about rules and who won each hand with 100% accuracy initially with accuracy fading after several minutes. After wheeling to next room, pt able to tell SLP about game she had just played with mod cueing. In rehab gym therapist gave pt task of finding 10 horseshoes that therapist had placed around gym. Pt initially found 3 independently, then 5 more with mod cueing, then final 3 with max cueing. Pt sat down between each bout and required mod encouragement to continue task. Pt given task of selecting certain color clothespins out of bin and placing them on certain rails. Pt able to attend to task for 6' and complete with 95% accuracy. After clothespin task pt declined further therapy and asked to go back to room. Pt left supine in bed with bed alarm on and all needs within reach.    Therapy Documentation Precautions:  Precautions Precautions: Fall Precaution Comments: apparent movement  disorder PTA (?dyskinesia), sits without warning Restrictions Weight Bearing Restrictions: No Pain: No/denies pain  See FIM for current functional status  Therapy/Group: Individual Therapy  Hosie SpangleGodfrey, Amonte Brookover  Hosie SpangleJess Nao Linz, PT, DPT 11/10/2014, 7:38 AM

## 2014-11-10 NOTE — Progress Notes (Signed)
Occupational Therapy Session Note  Patient Details  Name: Sharon [name] MRN: [id number] Date of Birth: 12/05/1945  Today's Date: 11/10/2014 OT Individual Time: [phone] OT Individual Time Calculation (min): 45 min  and  Today's Date: 11/10/2014 OT Missed Time: 15 Minutes Missed Time Reason: Patient unwilling/refused to participate without medical reason   Short Term Goals: Week 2:  OT Short Term Goal 1 (Week 2): STG=LTG  Skilled Therapeutic Interventions/Progress Updates:    Pt resting in bed upon arrival and initially agreeable to getting OOB and dressing.  Pt declined bathing tasks this morning.  Once dressed patient required max encouragement to actively participate in therapy.  Attempted to engage patient in assembling simple jigsaw puzzle (24 piece).  Pt refused to accept pieces when presented to place in puzzle.  Pt refused to sort cards or ambulate to gather items in ADL apartment.  Encouragement to participate offered throughout session.  Pt ended 15 minutes early secondary to pt's continued refusals.  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

## 2014-11-10 NOTE — Progress Notes (Signed)
[facility] PHYSICAL MEDICINE & REHABILITATION     PROGRESS NOTE    Subjective/Complaints: Slept well. Still with some back discomfort ROS limited by cognition  Objective: Vital Signs: Blood pressure 138/71, pulse 62, temperature 98.7 F (37.1 C), temperature source Oral, resp. rate 18, height 5\' 4"  (1.626 m), weight 45.1 kg (99 lb 6.8 oz), SpO2 97 %. Dg Swallowing Func-speech Pathology  11/09/2014   Gray Bernhardt, CCC-SLP     11/09/2014 11:44 AM   Objective Swallowing Evaluation: Modified Barium Swallowing Study   Patient Details  Name: Sharon [name] MRN: [id number] Date of Birth: [birth year]  Today's Date: 11/09/2014 Time:  -     Past Medical History:  Past Medical History  Diagnosis Date  . Bipolar 1 disorder   . Dementia   . Other pancytopenia 06/03/2014  . Stroke    Past Surgical History:  Past Surgical History  Procedure Laterality Date  . Cholecystectomy     HPI:  Pt is a 69 y/o female with PMH of advanced dementia and bipolar  disorder who came in to [facility] following a fall at home and seizure  activity. CT revealed hemorrhagic contusion in the R cerebellum.  Pt had MBS on 10/21/13 and recommended Dys. 1 textures with  honey-thick liquids via tsp. Patient transferred to CIR on 10/25/14  and has had intermittent participation in therapy with limited  gains. MBS 11/05/14 completed, however, limited study due to poor  cooperation/participation. Repeating MBS this date with husband  Film/video editor) present, to assess for possible upgrade.      Recommendation/Prognosis  Clinical Impression:   Clinical impression: Repeat MBS completed today, with improved  participation by pt.  Husband was present, which undoubtedly  facilitated pt's cooperation.  Pt continues to present with  moderate oropharyngeal dysphagia characterized by reduced tongue  base retraction, resulting in trace vallecular residue across  consistencies.  Delayed swallow initiation to the vallecular and  pyriform sinuses noted due to decreased sensation for  primary  trigger site.  Frank aspiration of thin liquid was noted during  the swallow, with weak ineffective cough response observed. Trace  penetration was noted on teaspoon boluses of thin liquid.  Thin  liquids are not recommended at this time. Pt tolerated nectar  thick consistency with trace flash penetration noted only during  large and/or consecutive swallows.  Small individual sips of  nectar thick liquid were tolerated well, via cup and straw. Puree  consistency was tolerated well without penetration, aspiration or  significant post-swallow residue. Pt was given mixed consistency  (graham cracker and puree).  Insufficient mastication was noted,  with pt swallowing the bolus poorly chewed. Barium tablet was  given with nectar thick liquid, and was noted to stop at the  level of the vallecular. Multiple boluses of puree were required  to clear pill. Esophageal sweep following the barium tablet  revealed slow clearing of the esophagus. Pt performance was  improved today as compared to previous MBSs, however, given  presentation, puree diet continues to be recommended. Pt appears  safe to advance to nectar thick liquids, but crushed meds would  be the safest method of med administration.  Full supervision  during po intake continues to be appropriate, due to pt tendency  toward impulsivity.  Pt will likely require ongoing cues for  small bites and slow rate, and should remain upright for 20-30  minutes after po intake due to slow esophageal clearing.   Swallow Evaluation Recommendations:  Diet Recommendations: Dysphagia 1 (Puree);Nectar-thick  liquid Liquid Administration via: Cup;Straw Medication Administration: Crushed with puree Supervision: Patient able to self feed;Full supervision/cueing  for compensatory strategies Compensations: Slow rate;Small sips/bites;Check for  pocketing;Clear throat intermittently Postural Changes and/or Swallow Maneuvers: Seated upright 90  degrees;Upright 30-60 min after meal  Oral Care Recommendations: Oral care BID Other Recommendations: Order thickener from pharmacy;Remove water  pitcher Follow up Recommendations: 24 hour supervision/assistance;Skilled  Nursing facility    Prognosis:  Prognosis for Safe Diet Advancement: Fair Barriers to Reach Goals: Severity of  dysphagia;Behavior;Motivation   Individuals Consulted: Consulted and Agree with Results and  Recommendations: Patient;RN;Family member/caregiver Family Member Consulted: husband, Ray      SLP Assessment/Plan  Plan:  Speech Therapy Frequency (ACUTE ONLY): min 3x week Potential to Achieve Goals (ACUTE ONLY): Fair Potential Considerations (ACUTE ONLY): Previous level of  function;Ability to learn/carryover information;Family/community  support;Cooperation/participation level   Short Term Goals: Week 3: SLP Short Term Goal 1 (Week 3): Patient  will orient to place, time and situation with use of external  aids with Min A question and verbal cues.  SLP Short Term Goal 1 - Progress (Week 3): Progressing toward  goal SLP Short Term Goal 2 (Week 3): Patient will demonstrate  functional problem solving for basic and familiar self-care tasks  with Mod A verbal cues.  SLP Short Term Goal 2 - Progress (Week 3): Progressing toward  goal SLP Short Term Goal 3 (Week 3): Patient will sustain attention to  a functional task for 2 minutes with Mod A verbal cues for  redirection.  SLP Short Term Goal 3 - Progress (Week 3): Progressing toward  goal SLP Short Term Goal 4 (Week 3): Patient will utilize speech  intelligibility strategies with Mod A verbal cues with 75% of  observable opportunities to increase intelligibility to 75% at  the phrase level.  SLP Short Term Goal 4 - Progress (Week 3): Progressing toward  goal SLP Short Term Goal 5 (Week 3): Patient will consume current diet  with minimal overt s/s of aspiration with Min A verbal cues for  use of swallowing compensatory strategies.  SLP Short Term Goal 5 - Progress (Week 3): Progressing  toward  goal      General: Date of Onset: 10/14/14 Type of Study: Modified Barium Swallowing Study Reason for Referral: Objectively evaluate swallowing function Previous Swallow Assessment: MBSS done 3/31, 4/15 with  recommendation for continued Dys.1 textures and honey-thick  liquids via teaspoon Diet Prior to this Study: Honey-thick liquids;Dysphagia 1 (puree) Temperature Spikes Noted: No Respiratory Status: Room air History of Recent Intubation: No Behavior/Cognition: Requires  cueing;Impulsive;Distractible;Decreased sustained  attention;Alert;Cooperative;Pleasant mood Oral Cavity - Dentition: Edentulous (has dentures, but they are  ill-fitting due to significant weight loss.) Oral Motor / Sensory Function: Impaired - see Bedside swallow  eval Self-Feeding Abilities: Able to feed self Patient Positioning: Upright in chair Baseline Vocal Quality: Clear Volitional Cough: Weak Volitional Swallow: Able to elicit Pharyngeal Secretions: Not observed secondary MBS   Reason for Referral:   Objectively evaluate swallowing function    Oral Phase: Oral Preparation/Oral Phase Oral Phase: WFL Oral - Honey Oral - Honey Teaspoon: Not tested Oral - Honey Cup: Not tested Oral - Nectar Oral - Nectar Teaspoon: Not tested Oral - Nectar Cup: Lingual pumping Oral - Nectar Straw: Lingual pumping Oral - Thin Oral - Thin Teaspoon: Lingual pumping Oral - Thin Cup: Lingual pumping Oral - Thin Straw: Not tested Oral - Solids Oral - Puree: Lingual pumping Oral - Regular: Lingual pumping Oral - Multi-consistency:  Lingual pumping;Impaired  mastication;Delayed oral transit Oral - Pill: Lingual pumping   Pharyngeal Phase:  Pharyngeal Phase Pharyngeal Phase: Impaired Pharyngeal - Honey Pharyngeal - Honey Teaspoon: Not tested Pharyngeal - Honey Cup: Not tested Pharyngeal - Nectar Pharyngeal - Nectar Teaspoon: Not tested Pharyngeal - Nectar Cup: Delayed swallow initiation;Premature  spillage to pyriform sinuses;Premature spillage to   valleculae;Reduced tongue base retraction;Pharyngeal residue -  valleculae;Penetration/Aspiration during swallow Penetration/Aspiration details (nectar cup): Material enters  airway, remains ABOVE vocal cords then ejected out (trace flash  penetration seen only with large, consecutive boluses.) Pharyngeal - Nectar Straw: Not tested Pharyngeal - Thin Pharyngeal - Thin Teaspoon: Delayed swallow initiation;Premature  spillage to pyriform sinuses;Premature spillage to  valleculae;Reduced tongue base retraction;Pharyngeal residue -  valleculae;Penetration/Aspiration during swallow Penetration/Aspiration details (thin teaspoon): Material enters  airway, remains ABOVE vocal cords and not ejected out Pharyngeal - Thin Cup: Delayed swallow initiation;Premature  spillage to pyriform sinuses;Premature spillage to  valleculae;Reduced tongue base retraction;Pharyngeal residue -  valleculae;Penetration/Aspiration during swallow Penetration/Aspiration details (thin cup): Material enters  airway, passes BELOW cords and not ejected out despite cough  attempt by patient Pharyngeal - Solids Pharyngeal - Puree: Delayed swallow initiation;Premature spillage  to pyriform sinuses;Premature spillage to valleculae;Reduced  tongue base retraction;Pharyngeal residue - valleculae Pharyngeal - Regular: Not tested Pharyngeal - Multi-consistency: Delayed swallow  initiation;Premature spillage to pyriform sinuses;Premature  spillage to valleculae;Reduced tongue base retraction;Pharyngeal  residue - valleculae Pharyngeal - Pill: Delayed swallow initiation;Premature spillage  to valleculae;Reduced tongue base retraction;Pharyngeal residue -  valleculae;Other (Comment) (barium tablet stopped at vallecular  sinus and required multliple boluses of puree to clear pill.)   Cervical Esophageal Phase  Cervical Esophageal Phase Cervical Esophageal Phase: WFL   GN        Celia B. HumboldtBueche, Valley West Community HospitalMSP, CCC-SLP [phone]  Leigh AuroraBueche, Celia Brown 11/09/2014, 11:42 AM                     No results for input(s): WBC, HGB, HCT, PLT in the last 72 hours.  Recent Labs  11/08/14 0635  NA 139  K 3.8  CL 103  GLUCOSE 89  BUN 15  CREATININE 0.57  CALCIUM 9.0   CBG (last 3)  No results for input(s): GLUCAP in the last 72 hours.  Wt Readings from Last 3 Encounters:  11/10/14 45.1 kg (99 lb 6.8 oz)  10/25/14 51.302 kg (113 lb 1.6 oz)  07/09/14 49.17 kg (108 lb 6.4 oz)    Physical Exam:  Constitutional:   No acute distress HENT:  Head: Normocephalic.  Eyes: EOM are normal. Pupils are equal, round, and reactive to light.     Neck: Normal range of motion. Neck supple. No tracheal deviation present. No thyromegaly present.  Cardiovascular: Normal rate and regular rhythm.  No murmur heard. Respiratory: Effort normal and breath sounds normal. No respiratory distress. She has no wheezes. She has no rales.  GI: Soft. Bowel sounds are normal. She exhibits no distension. There is no tenderness. There is no rebound.  Musculoskeletal: She exhibits low back tenderness. She exhibits no edema.  She moves all extremities  Neurological: Coordination abnormal.  Patient is alert flat effect. Follows simple commands. Oriented to name and hospital---not time,reason. Moves all 4's fairly equally with inconsistent effort. Skin: Skin is warm and dry.  Psychiatric:  Remains confused but cooperative  Assessment/Plan: 1. Functional deficits secondary to TBI which require 3+ hours per day of interdisciplinary therapy in a comprehensive inpatient rehab setting. Physiatrist is providing close team supervision  and 24 hour management of active medical problems listed below. Physiatrist and rehab team continue to assess barriers to discharge/monitor patient progress toward functional and medical goals.  Now seeking placement   FIM: FIM - Bathing Bathing Steps Patient Completed: Right Arm, Left Arm, Right upper leg, Left upper leg, Abdomen, Chest, Front perineal area,  Buttocks Bathing: 4: Min-Patient completes 8-9 56f 10 parts or 75+ percent  FIM - Upper Body Dressing/Undressing Upper body dressing/undressing steps patient completed: Thread/unthread right bra strap, Thread/unthread left bra strap, Hook/unhook bra, Thread/unthread right sleeve of pullover shirt/dresss, Thread/unthread left sleeve of pullover shirt/dress, Put head through opening of pull over shirt/dress, Pull shirt over trunk Upper body dressing/undressing: 5: Supervision: Safety issues/verbal cues FIM - Lower Body Dressing/Undressing Lower body dressing/undressing steps patient completed: Thread/unthread right pants leg, Thread/unthread left pants leg, Pull pants up/down, Don/Doff right shoe, Don/Doff left shoe, Thread/unthread right underwear leg, Thread/unthread left underwear leg, Pull underwear up/down, Fasten/unfasten right shoe, Fasten/unfasten left shoe, Don/Doff right sock, Don/Doff left sock Lower body dressing/undressing: 5: Supervision: Safety issues/verbal cues  FIM - Toileting Toileting steps completed by patient: Adjust clothing prior to toileting, Performs perineal hygiene, Adjust clothing after toileting Toileting Assistive Devices: Grab bar or rail for support Toileting: 4: Steadying assist  FIM - Diplomatic Services operational officer Devices: Therapist, music Transfers: 5-To toilet/BSC: Supervision (verbal cues/safety issues), 5-From toilet/BSC: Supervision (verbal cues/safety issues)  FIM - Banker Devices: Arm rests Bed/Chair Transfer: 5: Bed > Chair or W/C: Supervision (verbal cues/safety issues), 5: Chair or W/C > Bed: Supervision (verbal cues/safety issues)  FIM - Locomotion: Wheelchair Locomotion: Wheelchair: 0: Activity did not occur FIM - Locomotion: Ambulation Locomotion: Ambulation Assistive Devices: Other (comment) (none) Ambulation/Gait Assistance: 5: Supervision Locomotion: Ambulation: 1: Travels less than 50  ft with supervision/safety issues  Comprehension Comprehension Mode: Auditory Comprehension: 3-Understands basic 50 - 74% of the time/requires cueing 25 - 50%  of the time  Expression Expression Mode: Verbal Expression: 3-Expresses basic 50 - 74% of the time/requires cueing 25 - 50% of the time. Needs to repeat parts of sentences.  Social Interaction Social Interaction: 2-Interacts appropriately 25 - 49% of time - Needs frequent redirection.  Problem Solving Problem Solving: 1-Solves basic less than 25% of the time - needs direction nearly all the time or does not effectively solve problems and may need a restraint for safety  Memory Memory: 1-Recognizes or recalls less than 25% of the time/requires cueing greater than 75% of the time  Medical Problem List and Plan: 1. Functional deficits secondary to TBI after a fall with left frontal, left temporal bleed and right cerebellar contusion as well as right basal skull fractures 2. DVT Prophylaxis/Anticoagulation: SCDs. Monitor for any signs of DVT 3. Pain Management: Tylenol as needed 4. Mood/bipolar disorder/dementia: Celexa 20 mg daily, Seroquel 300 mg twice a day, Ativan every 4 hours as needed. Fairly substantial dementia at baseline  -mood has been stable 5. Neuropsych: This patient is not capable of making decisions on her own behalf. 6. Skin/Wound Care: Routine skin checks 7. Fluids/Electrolytes/Nutrition: encouarge PO, RD follow up as well 8. Seizure disorder. Keppra 500 mg twice a day. Monitor for any seizure activity 9. Dysphagia. Dysphagia- nectar thick liquids/ D1.    -continues off IVF    -follow up labs  -megace has helped    10. Hypertension. Lasix 20 mg daily (hold), lisinopril 2.5 mg daily. Monitor with increased mobility  -recent labs normal.   -holding lasix 11.  Urinary frequency. PVR's better. (Now off enablex). continue Flomax 0.4 mg daily at bedtime.   -Urine + 100k enterococcus: amoxil completed--    -   urecholine-- 25mg  tid has been effective  -follow up ucx pending   12.  Orthostatic Hypotension---flomax and lisinopril stopped   LOS (Days) 16 A FACE TO FACE EVALUATION WAS PERFORMED                                                               SWARTZ,ZACHARY T 11/10/2014 8:18 AM

## 2014-11-10 NOTE — Progress Notes (Signed)
Social Work Patient ID: Sharon [name], female   DOB: 13-Mar-1946, 69 y.o.   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  Urine retention  11/03/2014   .  Dysphagia, pharyngoesophageal phase  11/03/2014   .  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  Frontal lobe contusion     .  Hypoxemia  10/21/2014   .  Pulmonary edema     .  Intracerebral hemorrhage  10/20/2014   .  Intracranial hemorrhage  10/14/2014   .  Other pancytopenia  06/03/2014     Expected Discharge Date: Expected Discharge Date:  (SNF)  Team Members Present: Physician leading conference: Dr. Faith [name] Social Worker Present: Sharon Jupiter, LCSW Nurse Present: Sharon Bode, RN PT Present: Sharon [name], PT OT Present: Sharon [name], COTA;Sharon [name], OT SLP Present: Sharon [name], SLP Other (Discipline and Name): Sharon Glazier, RN [facility]) PPS Coordinator present : Sharon Duck, RN, [facility]        Current Status/Progress  Goal  Weekly Team Focus   Medical     eating better. voiding. diet upgraded today   advance diet  nutrition, dysphagia, sleep pattersns    Bowel/Bladder     Pt voiding with low PVRs.  timed toileting inplace. Brief used for incont episodes at times. LBM 4-17  Manage bowel and bladder min assist   cont. timed toileting   Swallow/Nutrition/ Hydration     Dys. 1 textures with nectar-thick liquids via cup; Max A for use of swallow strategies  Mod A for use of swallow strategies   increase use of swallow strategies, tolerance of current diet   ADL's     decreased activity tolerance/arousal; min A overall; occasional steady A for functional transfers  min assist overall and supervision UB self-care   attention, initiation, cognitive remediation, functional transfers, standing balance, activity tolerance   Mobility     Supervision-min guard   supervision for mobility, min-mod A for cognition   initiation, sustained attention, orientation, alertness, dynamic balance, activity tolerance, patient/family education    Communication     Min A at phrase level  Min A at phrase level  continue to increase verbal expression    Safety/Cognition/ Behavioral Observations    Mod-Max A  overall Min A with Mod A for attention   arousal, sustained attention, initiation, familiar problem-solving, family education   Pain     no complinats of pain         Skin     no skin breakdown. foam to sacrum  patient will remain free from skin  breakdown while in rehab min assist  assess skin q shift    Rehab Goals Patient on target to meet rehab goals: Yes *See Care Plan and progress notes for long and short-term goals.    Barriers to Discharge:  ongoing cognitive and behavioral deficits      Possible Resolutions to Barriers:   supervision at home     Discharge Planning/Teaching Needs:   plan has changed to SNF as husband does not feel he can meet care needs       Team Discussion:    SW reports plan has changed to SNF.  Performed a follow up MBS with liquids upgraded to nectar.  Better po intake overall but no change in cognition.   Revisions to Treatment Plan:    Change in d/c plan     Continued Need for Acute Rehabilitation Level of Care: The patient requires daily medical management by a physician with specialized training in physical medicine and rehabilitation for the following conditions: Daily direction of a multidisciplinary physical rehabilitation program to ensure safe treatment while eliciting the highest outcome that is of practical value to the patient.: Yes Daily medical management of patient stability for increased activity during participation in an intensive rehabilitation regime.: Yes Daily analysis of laboratory values and/or radiology reports with any subsequent need for medication adjustment of medical intervention for : Post surgical problems;Neurological problems  Sharon [name] 11/10/2014, 3:10 PM                 Sharon PancoastLucy R Shalaunda Weatherholtz, LCSW Social Worker Signed  Patient Care Conference 11/04/2014  3:19 PM    Expand All Collapse All   Inpatient RehabilitationTeam Conference and Plan of Care Update Date: 11/02/2014   Time: 2:45  PM     Patient Name: Sharon [name]       Medical Record Number: [id number]  Date of Birth: [birth year] Sex: Female         Room/Bed: 4W07C/4W07C-01 Payor Info: Payor: MEDICARE / Plan: MEDICARE PART A AND B / Product Type: *No Product type* /    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  Urine retention  11/03/2014   .  Dysphagia, pharyngoesophageal phase  11/03/2014   .  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  Frontal lobe contusion     .  Hypoxemia  10/21/2014   .  Pulmonary edema     .  Intracerebral hemorrhage  10/20/2014   .  Intracranial hemorrhage  10/14/2014   .  Other pancytopenia   06/03/2014     Expected Discharge Date: Expected Discharge Date: 11/09/14  Team Members Present: Physician leading conference: Dr. Faith RogueZachary [name] Social Worker Present: Sharon JupiterLucy Javarri Segal, LCSW Nurse Present: Carlean PurlMaryann Barbour, RN PT Present: Sharon Huggerebecca Varner, PT OT Present: Scherrie NovemberKayla Perkinson, OT SLP Present: Sharon Gottronourtney Payne, SLP Other (Discipline and Name): Sharon GlazierBarbara Boyette, RN [facility] PPS Coordinator present : Sharon DuckMarie Noel, RN, CRRN        Current Status/Progress  Goal  Weekly Team Focus   Medical     SLEEPING FAIRLY WELL, still with i and o caths. oral intake issues  improve intake nutrition  nurition, swallowing  Bowel/Bladder     Incontinent of bowel. I&O cathing every 8 hours.   To empty bladder without use of I&O cathing.   Timed toileting   Swallow/Nutrition/ Hydration     Dys. 1 textures with honey-thick liquids via teaspoon; Max A for use of swallow strategies  Mod A for use of swallow strategies   increase use of swallow strategies, tolerance of current diet with increased PO intake, possible trials of nectar-thick liquids    ADL's     decreased activity tolerance/arousal; min/mod A overall   min assist overall and supervision UB self-care   attention, initiation, cognitive remediation, functional transfers, standing balance, activity tolerance   Mobility     overall min A with functional mobility but can require +2 assist for safety with fluctuating status throughout day, fatigues quickly   S-min A  attention, problem solving, orientation, initiation, arousal, standing balance, NMR, functional mobility, activity tolerance, pt/family education   Communication     Mod A for 75% intelligibility at word level   Min A at phrase level  increase overall verbal expression, wearing dentures during therapy   Safety/Cognition/ Behavioral Observations    Mod-Max A  overall Min A with Mod A for attention   arousal, sustained attention, initiation, functional, familiar problem-solving, family education    Pain     No complaints of pain  Patient will remain at or below a 3   Continue to monitor pain every 4 hours and prn   Skin     no current skin breakdown  patient will remain free from skin breakdown while in rehab.    Continue to monitor skin every shift and prn    Rehab Goals Patient on target to meet rehab goals: Yes *See Care Plan and progress notes for long and short-term goals.    Barriers to Discharge:  see prior     Possible Resolutions to Barriers:   continued NMR, cognitive perceptual rx      Discharge Planning/Teaching Needs:   home with husband to provide/ arrange 24/7 assistance        Team Discussion:    Still requiring I/O caths; IVF @ hs; poor po and UTI.  Supervision with mobility overall but does present with posterior lean.  Some overall improvement.  Need to repeat MBS as still on honey thick liquids.  Need to have husband in for education.  SW reports husband planning private duty but also to offer option of Adult Day Care   Revisions to Treatment Plan:    none    Continued Need for Acute Rehabilitation Level of Care: The patient requires daily medical management by a physician with specialized training in physical medicine and rehabilitation for the following conditions: Daily direction of a multidisciplinary physical rehabilitation program to ensure safe treatment while eliciting the highest outcome that is of practical value to the patient.: Yes Daily medical management of patient stability for increased activity during participation in an intensive rehabilitation regime.: Yes Daily analysis of laboratory values and/or radiology reports with any subsequent need for medication adjustment of medical intervention for : Neurological problems;Other  Rosana Farnell 11/04/2014, 3:19 PM

## 2014-11-11 ENCOUNTER — Inpatient Hospital Stay (HOSPITAL_COMMUNITY): Payer: Medicare Other

## 2014-11-11 ENCOUNTER — Inpatient Hospital Stay (HOSPITAL_COMMUNITY): Payer: Medicare Other | Admitting: Speech Pathology

## 2014-11-11 ENCOUNTER — Inpatient Hospital Stay (HOSPITAL_COMMUNITY): Payer: Medicare Other | Admitting: Physical Therapy

## 2014-11-11 DIAGNOSIS — S06311A Contusion and laceration of right cerebrum with loss of consciousness of 30 minutes or less, initial encounter: Secondary | ICD-10-CM

## 2014-11-11 MED ORDER — AMOXICILLIN 250 MG PO CAPS
250.0000 mg | ORAL_CAPSULE | Freq: Three times a day (TID) | ORAL | Status: DC
  Administered 2014-11-11 – 2014-11-12 (×4): 250 mg via ORAL
  Filled 2014-11-11 (×7): qty 1

## 2014-11-11 NOTE — Progress Notes (Addendum)
Physical Therapy Session Note  Patient Details  Name: Sharon [name] MRN: [id number] Date of Birth: 03/21/1946  Today's Date: 11/11/2014 PT Individual Time: 0900-1000 PT Individual Time Calculation (min): 60 min   Short Term Goals: Week 3:  PT Short Term Goal 1 (Week 3): Patient will perform bed mobility with supervision 50% of time.  PT Short Term Goal 2 (Week 3): Patient will transfer bed <> wheelchair with consistent S. PT Short Term Goal 3 (Week 3): Patient will demonstrate dynamic standing balance x 5 min with consistent S. PT Short Term Goal 4 (Week 3): Patient will demonstrate sustained attention to functional task x 60 sec with mod cues.  PT Short Term Goal 5 (Week 3): Patient will demonstrate functional problem solving for basic mobility task with mod cues.   Skilled Therapeutic Interventions/Progress Updates:  Pt required max cues to participate initially, then min.  Tx focused on initiation, attention to task, dual task, functional gait.   Bed mobility without cues, safely.  Pt stood at sink to wash hands and put in dentures, with cues to initiate, superviison.  Gait to/from room x 2 with hand hold assist to initiate, then supervision.  Functional gait pushing w/c to transport towels and pillow cases back to room.  Pt stated she had to use BR, and was continent of B and B even though she had to travel back to room.  See FIM. Pt stated she was thirsty, so sat EOB and drank thickened juices and some breakfast, attending to this with rare cue, x 10 minutes.    Pt stated she was tired and wanted to lie down, but when questioned, she agreed that her balance was not good, and participated in 10 x 1 calf raises, 10 x 1 alternating toe raises, and 20 x 1 wt shifting L><R , all in standing with hand hold assist.  Pt left resting in R sidelying, alarm set and all needs in place.    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

## 2014-11-11 NOTE — Progress Notes (Signed)
Speech Language Pathology Daily Session Note  Patient Details  Name: Sharon [name] MRN: [id number] Date of Birth: [birth year]  Today's Date: 11/11/2014 SLP Individual Time: 1430-1500 SLP Individual Time Calculation (min): 30 min  Short Term Goals: Week 3: SLP Short Term Goal 1 (Week 3): Patient will orient to place, time and situation with use of external aids with Min A question and verbal cues.  SLP Short Term Goal 1 - Progress (Week 3): Progressing toward goal SLP Short Term Goal 2 (Week 3): Patient will demonstrate functional problem solving for basic and familiar self-care tasks with Mod A verbal cues.  SLP Short Term Goal 2 - Progress (Week 3): Progressing toward goal SLP Short Term Goal 3 (Week 3): Patient will sustain attention to a functional task for 2 minutes with Mod A verbal cues for redirection.  SLP Short Term Goal 3 - Progress (Week 3): Progressing toward goal SLP Short Term Goal 4 (Week 3): Patient will utilize speech intelligibility strategies with Mod A verbal cues with 75% of observable opportunities to increase intelligibility to 75% at the phrase level.  SLP Short Term Goal 4 - Progress (Week 3): Progressing toward goal SLP Short Term Goal 5 (Week 3): Patient will consume current diet with minimal overt s/s of aspiration with Min A verbal cues for use of swallowing compensatory strategies.  SLP Short Term Goal 5 - Progress (Week 3): Progressing toward goal  Skilled Therapeutic Interventions: Skilled treatment session focused on cognitive and dysphagia goals. Upon arrival, patient was supine in bed with husband present and reported she needed to use the bathroom. Patient ambulated to bathroom and required Min A verbal cues for problem solving and sequencing with task.  Patient was independently oriented to month, place and situation but required cues for orientation to date. Patient requested water and consumed nectar-thick water via cup without overt s/s of aspiration but  required Mod A verbal and tactile cues for use of small sips.  Patient left at RN station with quick release belt in place. Continue with current plan of care.    FIM:  Comprehension Comprehension Mode: Auditory Comprehension: 3-Understands basic 50 - 74% of the time/requires cueing 25 - 50%  of the time Expression Expression Mode: Verbal Expression: 3-Expresses basic 50 - 74% of the time/requires cueing 25 - 50% of the time. Needs to repeat parts of sentences. Social Interaction Social Interaction: 2-Interacts appropriately 25 - 49% of time - Needs frequent redirection. Problem Solving Problem Solving: 1-Solves basic less than 25% of the time - needs direction nearly all the time or does not effectively solve problems and may need a restraint for safety Memory Memory: 2-Recognizes or recalls 25 - 49% of the time/requires cueing 51 - 75% of the time FIM - Eating Eating Activity: 5: Supervision/cues  Pain Pain Assessment Pain Assessment: No/denies pain  Therapy/Group: Individual Therapy  Sharon [name] 11/11/2014, 4:32 PM

## 2014-11-11 NOTE — Progress Notes (Signed)
[place] PHYSICAL MEDICINE & REHABILITATION     PROGRESS NOTE    Subjective/Complaints: Slept well. No new issues ROS limited by cognition  Objective: Vital Signs: Blood pressure 111/71, pulse 75, temperature 98.1 F (36.7 C), temperature source Oral, resp. rate 16, height 5\' 4"  (1.626 m), weight 44.9 kg (98 lb 15.8 oz), SpO2 96 %. Dg Swallowing Func-speech Pathology  11/09/2014   Gray Bernhardt, CCC-SLP     11/09/2014 11:44 AM   Objective Swallowing Evaluation: Modified Barium Swallowing Study   Patient Details  Name: Sharon [name] MRN: [id number] Date of Birth: 10-29-1945  Today's Date: 11/09/2014 Time:  -     Past Medical History:  Past Medical History  Diagnosis Date  . Bipolar 1 disorder   . Dementia   . Other pancytopenia 06/03/2014  . Stroke    Past Surgical History:  Past Surgical History  Procedure Laterality Date  . Cholecystectomy     HPI:  Pt is a 69 y/o female with PMH of advanced dementia and bipolar  disorder who came in to [facility] following a fall at home and seizure  activity. CT revealed hemorrhagic contusion in the R cerebellum.  Pt had MBS on 10/21/13 and recommended Dys. 1 textures with  honey-thick liquids via tsp. Patient transferred to CIR on 10/25/14  and has had intermittent participation in therapy with limited  gains. MBS 11/05/14 completed, however, limited study due to poor  cooperation/participation. Repeating MBS this date with husband  Film/video editor) present, to assess for possible upgrade.      Recommendation/Prognosis  Clinical Impression:   Clinical impression: Repeat MBS completed today, with improved  participation by pt.  Husband was present, which undoubtedly  facilitated pt's cooperation.  Pt continues to present with  moderate oropharyngeal dysphagia characterized by reduced tongue  base retraction, resulting in trace vallecular residue across  consistencies.  Delayed swallow initiation to the vallecular and  pyriform sinuses noted due to decreased sensation for primary  trigger  site.  Frank aspiration of thin liquid was noted during  the swallow, with weak ineffective cough response observed. Trace  penetration was noted on teaspoon boluses of thin liquid.  Thin  liquids are not recommended at this time. Pt tolerated nectar  thick consistency with trace flash penetration noted only during  large and/or consecutive swallows.  Small individual sips of  nectar thick liquid were tolerated well, via cup and straw. Puree  consistency was tolerated well without penetration, aspiration or  significant post-swallow residue. Pt was given mixed consistency  (graham cracker and puree).  Insufficient mastication was noted,  with pt swallowing the bolus poorly chewed. Barium tablet was  given with nectar thick liquid, and was noted to stop at the  level of the vallecular. Multiple boluses of puree were required  to clear pill. Esophageal sweep following the barium tablet  revealed slow clearing of the esophagus. Pt performance was  improved today as compared to previous MBSs, however, given  presentation, puree diet continues to be recommended. Pt appears  safe to advance to nectar thick liquids, but crushed meds would  be the safest method of med administration.  Full supervision  during po intake continues to be appropriate, due to pt tendency  toward impulsivity.  Pt will likely require ongoing cues for  small bites and slow rate, and should remain upright for 20-30  minutes after po intake due to slow esophageal clearing.   Swallow Evaluation Recommendations:  Diet Recommendations: Dysphagia 1 (Puree);Nectar-thick liquid Liquid  Administration via: Cup;Straw Medication Administration: Crushed with puree Supervision: Patient able to self feed;Full supervision/cueing  for compensatory strategies Compensations: Slow rate;Small sips/bites;Check for  pocketing;Clear throat intermittently Postural Changes and/or Swallow Maneuvers: Seated upright 90  degrees;Upright 30-60 min after meal Oral Care  Recommendations: Oral care BID Other Recommendations: Order thickener from pharmacy;Remove water  pitcher Follow up Recommendations: 24 hour supervision/assistance;Skilled  Nursing facility    Prognosis:  Prognosis for Safe Diet Advancement: Fair Barriers to Reach Goals: Severity of  dysphagia;Behavior;Motivation   Individuals Consulted: Consulted and Agree with Results and  Recommendations: Patient;RN;Family member/caregiver Family Member Consulted: husband, Ray      SLP Assessment/Plan  Plan:  Speech Therapy Frequency (ACUTE ONLY): min 3x week Potential to Achieve Goals (ACUTE ONLY): Fair Potential Considerations (ACUTE ONLY): Previous level of  function;Ability to learn/carryover information;Family/community  support;Cooperation/participation level   Short Term Goals: Week 3: SLP Short Term Goal 1 (Week 3): Patient  will orient to place, time and situation with use of external  aids with Min A question and verbal cues.  SLP Short Term Goal 1 - Progress (Week 3): Progressing toward  goal SLP Short Term Goal 2 (Week 3): Patient will demonstrate  functional problem solving for basic and familiar self-care tasks  with Mod A verbal cues.  SLP Short Term Goal 2 - Progress (Week 3): Progressing toward  goal SLP Short Term Goal 3 (Week 3): Patient will sustain attention to  a functional task for 2 minutes with Mod A verbal cues for  redirection.  SLP Short Term Goal 3 - Progress (Week 3): Progressing toward  goal SLP Short Term Goal 4 (Week 3): Patient will utilize speech  intelligibility strategies with Mod A verbal cues with 75% of  observable opportunities to increase intelligibility to 75% at  the phrase level.  SLP Short Term Goal 4 - Progress (Week 3): Progressing toward  goal SLP Short Term Goal 5 (Week 3): Patient will consume current diet  with minimal overt s/s of aspiration with Min A verbal cues for  use of swallowing compensatory strategies.  SLP Short Term Goal 5 - Progress (Week 3): Progressing toward   goal      General: Date of Onset: 10/14/14 Type of Study: Modified Barium Swallowing Study Reason for Referral: Objectively evaluate swallowing function Previous Swallow Assessment: MBSS done 3/31, 4/15 with  recommendation for continued Dys.1 textures and honey-thick  liquids via teaspoon Diet Prior to this Study: Honey-thick liquids;Dysphagia 1 (puree) Temperature Spikes Noted: No Respiratory Status: Room air History of Recent Intubation: No Behavior/Cognition: Requires  cueing;Impulsive;Distractible;Decreased sustained  attention;Alert;Cooperative;Pleasant mood Oral Cavity - Dentition: Edentulous (has dentures, but they are  ill-fitting due to significant weight loss.) Oral Motor / Sensory Function: Impaired - see Bedside swallow  eval Self-Feeding Abilities: Able to feed self Patient Positioning: Upright in chair Baseline Vocal Quality: Clear Volitional Cough: Weak Volitional Swallow: Able to elicit Pharyngeal Secretions: Not observed secondary MBS   Reason for Referral:   Objectively evaluate swallowing function    Oral Phase: Oral Preparation/Oral Phase Oral Phase: WFL Oral - Honey Oral - Honey Teaspoon: Not tested Oral - Honey Cup: Not tested Oral - Nectar Oral - Nectar Teaspoon: Not tested Oral - Nectar Cup: Lingual pumping Oral - Nectar Straw: Lingual pumping Oral - Thin Oral - Thin Teaspoon: Lingual pumping Oral - Thin Cup: Lingual pumping Oral - Thin Straw: Not tested Oral - Solids Oral - Puree: Lingual pumping Oral - Regular: Lingual pumping Oral - Multi-consistency: Lingual pumping;Impaired  mastication;Delayed oral transit Oral - Pill: Lingual pumping   Pharyngeal Phase:  Pharyngeal Phase Pharyngeal Phase: Impaired Pharyngeal - Honey Pharyngeal - Honey Teaspoon: Not tested Pharyngeal - Honey Cup: Not tested Pharyngeal - Nectar Pharyngeal - Nectar Teaspoon: Not tested Pharyngeal - Nectar Cup: Delayed swallow initiation;Premature  spillage to pyriform sinuses;Premature spillage to  valleculae;Reduced  tongue base retraction;Pharyngeal residue -  valleculae;Penetration/Aspiration during swallow Penetration/Aspiration details (nectar cup): Material enters  airway, remains ABOVE vocal cords then ejected out (trace flash  penetration seen only with large, consecutive boluses.) Pharyngeal - Nectar Straw: Not tested Pharyngeal - Thin Pharyngeal - Thin Teaspoon: Delayed swallow initiation;Premature  spillage to pyriform sinuses;Premature spillage to  valleculae;Reduced tongue base retraction;Pharyngeal residue -  valleculae;Penetration/Aspiration during swallow Penetration/Aspiration details (thin teaspoon): Material enters  airway, remains ABOVE vocal cords and not ejected out Pharyngeal - Thin Cup: Delayed swallow initiation;Premature  spillage to pyriform sinuses;Premature spillage to  valleculae;Reduced tongue base retraction;Pharyngeal residue -  valleculae;Penetration/Aspiration during swallow Penetration/Aspiration details (thin cup): Material enters  airway, passes BELOW cords and not ejected out despite cough  attempt by patient Pharyngeal - Solids Pharyngeal - Puree: Delayed swallow initiation;Premature spillage  to pyriform sinuses;Premature spillage to valleculae;Reduced  tongue base retraction;Pharyngeal residue - valleculae Pharyngeal - Regular: Not tested Pharyngeal - Multi-consistency: Delayed swallow  initiation;Premature spillage to pyriform sinuses;Premature  spillage to valleculae;Reduced tongue base retraction;Pharyngeal  residue - valleculae Pharyngeal - Pill: Delayed swallow initiation;Premature spillage  to valleculae;Reduced tongue base retraction;Pharyngeal residue -  valleculae;Other (Comment) (barium tablet stopped at vallecular  sinus and required multliple boluses of puree to clear pill.)   Cervical Esophageal Phase  Cervical Esophageal Phase Cervical Esophageal Phase: WFL   GN        Celia B. McGregorBueche, Hemphill County HospitalMSP, CCC-SLP [phone]  Leigh AuroraBueche, Celia Brown 11/09/2014, 11:42 AM                    No  results for input(s): WBC, HGB, HCT, PLT in the last 72 hours.  Recent Labs  11/10/14 0654  NA 138  K 4.2  CL 105  GLUCOSE 90  BUN 12  CREATININE 0.59  CALCIUM 9.3   CBG (last 3)  No results for input(s): GLUCAP in the last 72 hours.  Wt Readings from Last 3 Encounters:  11/11/14 44.9 kg (98 lb 15.8 oz)  10/25/14 51.302 kg (113 lb 1.6 oz)  07/09/14 49.17 kg (108 lb 6.4 oz)    Physical Exam:  Constitutional:   No acute distress HENT:  Head: Normocephalic.  Eyes: EOM are normal. Pupils are equal, round, and reactive to light.     Neck: Normal range of motion. Neck supple. No tracheal deviation present. No thyromegaly present.  Cardiovascular: Normal rate and regular rhythm.  No murmur heard. Respiratory: Effort normal and breath sounds normal. No respiratory distress. She has no wheezes. She has no rales.  GI: Soft. Bowel sounds are normal. She exhibits no distension. There is no tenderness. There is no rebound.  Musculoskeletal: She exhibits low back tenderness. She exhibits no edema.  She moves all extremities  Neurological: Coordination abnormal.  Patient is alert flat effect. Follows simple commands. Oriented to name and hospital---not time,reason. Moves all 4's fairly equally with inconsistent effort. Skin: Skin is warm and dry.  Psychiatric:  Remains confused but cooperative  Assessment/Plan: 1. Functional deficits secondary to TBI which require 3+ hours per day of interdisciplinary therapy in a comprehensive inpatient rehab setting. Physiatrist is providing close team supervision and 24 hour  management of active medical problems listed below. Physiatrist and rehab team continue to assess barriers to discharge/monitor patient progress toward functional and medical goals.  Now seeking placement   FIM: FIM - Bathing Bathing Steps Patient Completed: Right Arm, Left Arm, Right upper leg, Left upper leg, Abdomen, Chest, Front perineal area, Buttocks Bathing: 0:  Activity did not occur  FIM - Upper Body Dressing/Undressing Upper body dressing/undressing steps patient completed: Thread/unthread right bra strap, Thread/unthread left bra strap, Hook/unhook bra, Thread/unthread right sleeve of pullover shirt/dresss, Thread/unthread left sleeve of pullover shirt/dress, Put head through opening of pull over shirt/dress, Pull shirt over trunk Upper body dressing/undressing: 5: Supervision: Safety issues/verbal cues FIM - Lower Body Dressing/Undressing Lower body dressing/undressing steps patient completed: Thread/unthread right pants leg, Thread/unthread left pants leg, Pull pants up/down, Don/Doff right shoe, Don/Doff left shoe, Thread/unthread right underwear leg, Thread/unthread left underwear leg, Pull underwear up/down, Fasten/unfasten right shoe, Fasten/unfasten left shoe, Don/Doff right sock, Don/Doff left sock Lower body dressing/undressing: 5: Supervision: Safety issues/verbal cues  FIM - Toileting Toileting steps completed by patient: Adjust clothing prior to toileting, Performs perineal hygiene, Adjust clothing after toileting Toileting Assistive Devices: Grab bar or rail for support Toileting: 4: Steadying assist  FIM - Diplomatic Services operational officer Devices: Grab bars Toilet Transfers: 5-To toilet/BSC: Supervision (verbal cues/safety issues), 5-From toilet/BSC: Supervision (verbal cues/safety issues)  FIM - Banker Devices: Arm rests Bed/Chair Transfer: 1: Supine > Sit: Total A (helper does all/Pt. < 25%), 5: Sit > Supine: Supervision (verbal cues/safety issues), 4: Chair or W/C > Bed: Min A (steadying Pt. > 75%), 4: Bed > Chair or W/C: Min A (steadying Pt. > 75%)  FIM - Locomotion: Wheelchair Locomotion: Wheelchair: 0: Activity did not occur FIM - Locomotion: Ambulation Locomotion: Ambulation Assistive Devices: Other (comment) (none) Ambulation/Gait Assistance: 5: Supervision, 4: Min  guard Locomotion: Ambulation: 4: Travels 150 ft or more with minimal assistance (Pt.>75%)  Comprehension Comprehension Mode: Auditory Comprehension: 3-Understands basic 50 - 74% of the time/requires cueing 25 - 50%  of the time  Expression Expression Mode: Verbal Expression: 3-Expresses basic 50 - 74% of the time/requires cueing 25 - 50% of the time. Needs to repeat parts of sentences.  Social Interaction Social Interaction: 1-Interacts appropriately less than 25% of the time. May be withdrawn or combative.  Problem Solving Problem Solving: 1-Solves basic less than 25% of the time - needs direction nearly all the time or does not effectively solve problems and may need a restraint for safety  Memory Memory: 1-Recognizes or recalls less than 25% of the time/requires cueing greater than 75% of the time  Medical Problem List and Plan: 1. Functional deficits secondary to TBI after a fall with left frontal, left temporal bleed and right cerebellar contusion as well as right basal skull fractures 2. DVT Prophylaxis/Anticoagulation: SCDs. Monitor for any signs of DVT 3. Pain Management: Tylenol as needed 4. Mood/bipolar disorder/dementia: Celexa 20 mg daily, Seroquel 300 mg twice a day, Ativan every 4 hours as needed. Fairly substantial dementia at baseline  -mood has been stable 5. Neuropsych: This patient is not capable of making decisions on her own behalf. 6. Skin/Wound Care: Routine skin checks 7. Fluids/Electrolytes/Nutrition: encouarge PO, RD follow up as well 8. Seizure disorder. Keppra 500 mg twice a day. Monitor for any seizure activity 9. Dysphagia. Dysphagia- nectar thick liquids/ D1.    -continues off IVF    -follow up labs  -megace has helped  -labs ok yesterday 10. Hypertension.  Lasix 20 mg daily (hold), lisinopril 2.5 mg daily. Monitor with increased mobility  -recent labs normal.   -holding lasix 11. Urinary frequency.   continue Flomax 0.4 mg daily at bedtime.    -Urine + 100k enterococcus: amoxil completed--    -  urecholine--  tid has been effective  -follow up ucx with 100k GNR---resume amoxil 12.  Orthostatic Hypotension---flomax and lisinopril stopped   LOS (Days) 17 A FACE TO FACE EVALUATION WAS PERFORMED                                                               SWARTZ,ZACHARY T 11/11/2014 8:18 AM

## 2014-11-11 NOTE — Progress Notes (Signed)
Occupational Therapy Session Note  Patient Details  Name: Sharon GammaJoan L Blackson MRN: [id number] Date of Birth: 1946-05-19  Today's Date: 11/11/2014 OT Individual Time: 1003-1013 OT Individual Time Calculation (min): 10 min    Short Term Goals: Week 3:     Skilled Therapeutic Interventions/Progress Updates:    Pt seen for 1:1 OT session with focus on bed mobility, activity tolerance, orientation, and sustained attention. Pt received supine in bed reporting being "tired" and her "spines hurting." Pt initially refusing therapy however agreeable to sit EOB with max cues to take medication. Pt completed supine>sit at supervision level then returned to supine, declining further participation. Engaged in therapeutic conversation with focus on orientation. Pt oriented to place and situation however not time of day. Pt left supine in bed with all needs in reach.   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), sits without warning Restrictions Weight Bearing Restrictions: No General:   Vital Signs:   Pain:   ADL:   Exercises:   Other Treatments:    See FIM for current functional status  Therapy/Group: Individual Therapy  Daneil Danerkinson, Kodee Drury N 11/11/2014, 10:16 AM

## 2014-11-12 ENCOUNTER — Inpatient Hospital Stay (HOSPITAL_COMMUNITY): Payer: Medicare Other

## 2014-11-12 ENCOUNTER — Inpatient Hospital Stay (HOSPITAL_COMMUNITY): Payer: Medicare Other | Admitting: Speech Pathology

## 2014-11-12 ENCOUNTER — Inpatient Hospital Stay (HOSPITAL_COMMUNITY): Payer: Medicare Other | Admitting: Physical Therapy

## 2014-11-12 DIAGNOSIS — S06334S Contusion and laceration of cerebrum, unspecified, with loss of consciousness of 6 hours to 24 hours, sequela: Secondary | ICD-10-CM

## 2014-11-12 LAB — URINE CULTURE

## 2014-11-12 MED ORDER — SULFAMETHOXAZOLE-TRIMETHOPRIM 400-80 MG PO TABS
1.0000 | ORAL_TABLET | Freq: Two times a day (BID) | ORAL | Status: DC
  Administered 2014-11-12 – 2014-11-16 (×9): 1 via ORAL
  Filled 2014-11-12 (×10): qty 1

## 2014-11-12 NOTE — Progress Notes (Signed)
Speech Language Pathology Daily Session Note  Patient Details  Name: Sharon [name] MRN: [id number] Date of Birth: 10-14-1945  Today's Date: 11/12/2014 SLP Individual Time: 0900-0928 SLP Individual Time Calculation (min): 28 min  Short Term Goals: Week 3: SLP Short Term Goal 1 (Week 3): Patient will orient to place, time and situation with use of external aids with Min A question and verbal cues.  SLP Short Term Goal 1 - Progress (Week 3): Progressing toward goal SLP Short Term Goal 2 (Week 3): Patient will demonstrate functional problem solving for basic and familiar self-care tasks with Mod A verbal cues.  SLP Short Term Goal 2 - Progress (Week 3): Progressing toward goal SLP Short Term Goal 3 (Week 3): Patient will sustain attention to a functional task for 2 minutes with Mod A verbal cues for redirection.  SLP Short Term Goal 3 - Progress (Week 3): Progressing toward goal SLP Short Term Goal 4 (Week 3): Patient will utilize speech intelligibility strategies with Mod A verbal cues with 75% of observable opportunities to increase intelligibility to 75% at the phrase level.  SLP Short Term Goal 4 - Progress (Week 3): Progressing toward goal SLP Short Term Goal 5 (Week 3): Patient will consume current diet with minimal overt s/s of aspiration with Min A verbal cues for use of swallowing compensatory strategies.  SLP Short Term Goal 5 - Progress (Week 3): Progressing toward goal  Skilled Therapeutic Interventions:  Pt was seen for skilled ST targeting goals for dysphagia and cognition.  Pt was received upright at nursing station, awake, alert, and agreeable to participate in ST with encouragement.  SLP facilitated the session with skilled observations completed during pt consumption of nectar thick liquids via cup sips.  Pt required mod assist verbal cues for rate and portion control due to impulsivity and large sip size.  No overt s/s were noted at bedside.  Pt was oriented to place and date with  supervision question cues and oriented to situation with min-mod question cues.  Pt initiated request to use the restroom and was continent of bowel.  Min-supervision cues were provided for sequencing and initiation of hand hygiene.  Pt returned to nursing station with quick release belt donned.  Continue per current plan of care.    FIM:  Comprehension Comprehension Mode: Auditory Comprehension: 3-Understands basic 50 - 74% of the time/requires cueing 25 - 50%  of the time Expression Expression Mode: Verbal Expression: 3-Expresses basic 50 - 74% of the time/requires cueing 25 - 50% of the time. Needs to repeat parts of sentences. Social Interaction Social Interaction: 2-Interacts appropriately 25 - 49% of time - Needs frequent redirection. Problem Solving Problem Solving: 2-Solves basic 25 - 49% of the time - needs direction more than half the time to initiate, plan or complete simple activities Memory Memory: 2-Recognizes or recalls 25 - 49% of the time/requires cueing 51 - 75% of the time FIM - Eating Eating Activity: 5: Supervision/cues  Pain Pain Assessment Pain Assessment: No/denies pain   Therapy/Group: Individual Therapy  Sheyna Pettibone, Melanee SpryNicole L 11/12/2014, 12:51 PM

## 2014-11-12 NOTE — Progress Notes (Signed)
Physical Therapy Session Note  Patient Details  Name: Sharon [name] MRN: [id number] Date of Birth: 1945/10/29  Today's Date: 11/12/2014 PT Individual Time: 1300-1410 PT Individual Time Calculation (min): 70 min   Short Term Goals: Week 3:  PT Short Term Goal 1 (Week 3): Patient will perform bed mobility with supervision 50% of time.  PT Short Term Goal 2 (Week 3): Patient will transfer bed <> wheelchair with consistent S. PT Short Term Goal 3 (Week 3): Patient will demonstrate dynamic standing balance x 5 min with consistent S. PT Short Term Goal 4 (Week 3): Patient will demonstrate sustained attention to functional task x 60 sec with mod cues.  PT Short Term Goal 5 (Week 3): Patient will demonstrate functional problem solving for basic mobility task with mod cues.   Skilled Therapeutic Interventions/Progress Updates:   Session focused on sustained attention, initiation, sequencing, and ability to complete familiar therapeutic tasks, topographical orientation, standing balance, and activity tolerance. Patient received seated in day room finishing lunch meal with NT providing supervision. Patient's sister Marylu LundJanet present and actively participated in entire session. Patient very agreeable to participate this date and only attempted to return to bed x 1 but easily redirected. Patient performed path finding throughout rehab unit (day room > room > gym > ADL apt > room) with min questioning cues and ambulated with supervision-min guard up to 500 ft at a time with intermittent drifting to R and L. In room, patient sustained attention to task of folding clothes sitting edge of bed to place in suitcase x 25 minutes with min cues to initiate task. Patient had two items that she wanted to hang up in closet but no hangers present in room. With mod A multimodal cues, patient ambulated to empty patient room to locate additional hangers, returned to room, and hung clothes in closet. After brief seated rest, patient  was continent of urine with min questioning cues to initiate using bathroom and performed all toileting tasks with supervision. Patient required mod verbal cues to initiate and sequence hand hygiene. Patient doffed yellow hospital socks and donned personal stockings seated edge of bed with mod verbal cues to initiate and complete task with extra time. In therapy gym, patient performed static standing x 3 min with supervision while waiting to use stairs. Stair training up/down twelve 6" steps using 2 rails with supervision, self elected reciprocal pattern ascending and step-to pattern descending. Patient ambulated to ADL apartment and completed furniture transfers to couch with supervision. In ADL kitchen, patient participated in simple kitchen task with therapist and sister of making jello including choosing pudding or Jello, retrieving items to make it, following directions, working memory of directions, and following one step commands with mod > max A multimodal cues. Patient fatigued at end of session noted by declining to participate further in task or answer questions about task. Patient returned to room and left sitting in recliner with RN present to administer medications and sister in room.    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

## 2014-11-12 NOTE — Progress Notes (Signed)
[place] PHYSICAL MEDICINE & REHABILITATION     PROGRESS NOTE    Subjective/Complaints: No new issues. Slept well.  ROS limited by cognition  Objective: Vital Signs: Blood pressure 121/73, pulse 78, temperature 97.8 F (36.6 C), temperature source Oral, resp. rate 18, height 5\' 4"  (1.626 m), weight 44.7 kg (98 lb 8.7 oz), SpO2 98 %. No results found. No results for input(s): WBC, HGB, HCT, PLT in the last 72 hours.  Recent Labs  11/10/14 0654  NA 138  K 4.2  CL 105  GLUCOSE 90  BUN 12  CREATININE 0.59  CALCIUM 9.3   CBG (last 3)  No results for input(s): GLUCAP in the last 72 hours.  Wt Readings from Last 3 Encounters:  11/12/14 44.7 kg (98 lb 8.7 oz)  10/25/14 51.302 kg (113 lb 1.6 oz)  07/09/14 49.17 kg (108 lb 6.4 oz)    Physical Exam:  Constitutional:  No acute distress HENT:  Head: Normocephalic.  Eyes: EOM are normal. Pupils are equal, round, and reactive to light.     Neck: Normal range of motion. Neck supple. No tracheal deviation present. No thyromegaly present.  Cardiovascular: Normal rate and regular rhythm.  No murmur heard. Respiratory: Effort normal and breath sounds normal. No respiratory distress. She has no wheezes. She has no rales.  GI: Soft. Bowel sounds are normal. She exhibits no distension. There is no tenderness. There is no rebound.  Musculoskeletal: She exhibits low back tenderness. She exhibits no edema.  She moves all extremities  Neurological: Coordination abnormal.  Patient is alert flat effect. Follows simple commands. Oriented to name and hospital---not time,reason. Moves all 4's fairly equally with inconsistent effort. Skin: Skin is warm and dry.  Psychiatric:  Remains confused but cooperative  Assessment/Plan: 1. Functional deficits secondary to TBI which require 3+ hours per day of interdisciplinary therapy in a comprehensive inpatient rehab setting. Physiatrist is providing close team supervision and 24 hour management  of active medical problems listed below. Physiatrist and rehab team continue to assess barriers to discharge/monitor patient progress toward functional and medical goals.  Now placement.    FIM: FIM - Bathing Bathing Steps Patient Completed: Right Arm, Left Arm, Right upper leg, Left upper leg, Abdomen, Chest, Front perineal area, Buttocks Bathing: 0: Activity did not occur  FIM - Upper Body Dressing/Undressing Upper body dressing/undressing steps patient completed: Thread/unthread right bra strap, Thread/unthread left bra strap, Hook/unhook bra, Thread/unthread right sleeve of pullover shirt/dresss, Thread/unthread left sleeve of pullover shirt/dress, Put head through opening of pull over shirt/dress, Pull shirt over trunk Upper body dressing/undressing: 5: Supervision: Safety issues/verbal cues FIM - Lower Body Dressing/Undressing Lower body dressing/undressing steps patient completed: Thread/unthread right pants leg, Thread/unthread left pants leg, Pull pants up/down, Don/Doff right shoe, Don/Doff left shoe, Thread/unthread right underwear leg, Thread/unthread left underwear leg, Pull underwear up/down, Fasten/unfasten right shoe, Fasten/unfasten left shoe, Don/Doff right sock, Don/Doff left sock Lower body dressing/undressing: 5: Supervision: Safety issues/verbal cues  FIM - Toileting Toileting steps completed by patient: Adjust clothing prior to toileting, Adjust clothing after toileting Toileting Assistive Devices: Grab bar or rail for support Toileting: 4: Steadying assist  FIM - Diplomatic Services operational officerToilet Transfers Toilet Transfers Assistive Devices: Therapist, musicGrab bars Toilet Transfers: 5-To toilet/BSC: Supervision (verbal cues/safety issues), 5-From toilet/BSC: Supervision (verbal cues/safety issues)  FIM - BankerBed/Chair Transfer Bed/Chair Transfer Assistive Devices: Arm rests Bed/Chair Transfer: 5: Bed > Chair or W/C: Supervision (verbal cues/safety issues), 5: Chair or W/C > Bed: Supervision (verbal  cues/safety issues), 6: Supine >  Sit: No assist, 6: Sit > Supine: No assist  FIM - Locomotion: Wheelchair Locomotion: Wheelchair: 0: Activity did not occur FIM - Locomotion: Ambulation Locomotion: Ambulation Assistive Devices: Other (comment) (none) Ambulation/Gait Assistance: 5: Supervision, 4: Min guard Locomotion: Ambulation: 4: Travels 150 ft or more with minimal assistance (Pt.>75%)  Comprehension Comprehension Mode: Auditory Comprehension: 3-Understands basic 50 - 74% of the time/requires cueing 25 - 50%  of the time  Expression Expression Mode: Verbal Expression: 3-Expresses basic 50 - 74% of the time/requires cueing 25 - 50% of the time. Needs to repeat parts of sentences.  Social Interaction Social Interaction: 2-Interacts appropriately 25 - 49% of time - Needs frequent redirection.  Problem Solving Problem Solving: 1-Solves basic less than 25% of the time - needs direction nearly all the time or does not effectively solve problems and may need a restraint for safety  Memory Memory: 2-Recognizes or recalls 25 - 49% of the time/requires cueing 51 - 75% of the time  Medical Problem List and Plan: 1. Functional deficits secondary to TBI after a fall with left frontal, left temporal bleed and right cerebellar contusion as well as right basal skull fractures 2. DVT Prophylaxis/Anticoagulation: SCDs. Monitor for any signs of DVT 3. Pain Management: Tylenol as needed 4. Mood/bipolar disorder/dementia: Celexa 20 mg daily, Seroquel 300 mg twice a day, Ativan every 4 hours as needed. Fairly substantial dementia at baseline  -mood has been stable 5. Neuropsych: This patient is not capable of making decisions on her own behalf. 6. Skin/Wound Care: Routine skin checks 7. Fluids/Electrolytes/Nutrition: encouarge PO, RD follow up as well 8. Seizure disorder. Keppra 500 mg twice a day. Monitor for any seizure activity 9. Dysphagia. Dysphagia- nectar thick liquids/ D1.    -continues off  IVF    -follow up labs  -megace has helped  -labs normal this week.  10. Hypertension. Lasix 20 mg daily (hold), lisinopril 2.5 mg daily. Monitor with increased mobility  -recent labs normal.   -holding lasix 11. Urinary frequency.   continue Flomax 0.4 mg daily at bedtime.     - urecholine--  tid has been effective  -follow up ucx with 100k enterobacter---sens to bactrim---5 day course 12.  Orthostatic Hypotension---flomax and lisinopril stopped   LOS (Days) 18 A FACE TO FACE EVALUATION WAS PERFORMED                                                               SWARTZ,ZACHARY T 11/12/2014 7:40 AM

## 2014-11-12 NOTE — Progress Notes (Signed)
Occupational Therapy Session Note  Patient Details  Name: Sharon [name] MRN: [id number] Date of Birth: 24-Sep-1945  Today's Date: 11/12/2014 OT Individual Time: 1100-1200 OT Individual Time Calculation (min): 60 min    Short Term Goals: Week 3:  OT Short Term Goal 1 (Week 2): STG=LTG  Skilled Therapeutic Interventions/Progress Updates:    Pt engaged in BADLs including bathing and dressing with sit<>stand from w/c at sink.  Pt was unwilling to attempt hooking her bra this morning and required assistance.  Pt initially unwilling and resistant to walking to day room but agreed to transitioning to day room via w/c.  Pt given choice of taking a walk or playing a card game.  Pt chose card game. Pt engaged in game of "War" with occasional questioning verbal cue to determine winner.  Pt attended to task for approx 15 mins at supervision level.  Pt agreed to taking a "short" walk to ADL apartment and back with supervision.Pt returned to w/c and remained in w/c with QRB in place and sitting at nurses station.   Focus on activity tolerance, standing balance, task initiation, active participation, functional amb for simple home mgmt tasks, attention to task, cognitive remediation, and safety awareness.   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

## 2014-11-13 ENCOUNTER — Inpatient Hospital Stay (HOSPITAL_COMMUNITY): Payer: Medicare Other | Admitting: *Deleted

## 2014-11-13 NOTE — Progress Notes (Signed)
  [place] PHYSICAL MEDICINE & REHABILITATION     PROGRESS NOTE    Subjective/Complaints: No complaints  Objective: Vital Signs: Blood pressure 109/48, pulse 64, temperature 98.8 F (37.1 C), temperature source Oral, resp. rate 18, height 5\' 4"  (1.626 m), weight 99 lb 12.8 oz (45.269 kg), SpO2 100 %.  Physical Exam:    Well-developed well-nourished female in no acute distress. HEENT exam atraumatic, normocephalic, extraocular muscles are intact. Neck is supple. No jugular venous distention no thyromegaly. Chest clear to auscultation without increased work of breathing. Cardiac exam S1 and S2 are regular. Abdominal exam active bowel sounds, soft, nontender. Extremities no edema. Neurologic exam she is alert   Assessment/Plan: 1. Functional deficits secondary to TBI   Medical Problem List and Plan: 1. Functional deficits secondary to TBI after a fall with left frontal, left temporal bleed and right cerebellar contusion as well as right basal skull fractures 2. DVT Prophylaxis/Anticoagulation: SCDs. Monitor for any signs of DVT 3. Pain Management: Tylenol as needed 4. Mood/bipolar disorder/dementia: Celexa 20 mg daily, Seroquel 300 mg twice a day, Ativan every 4 hours as needed. Fairly substantial dementia at baseline  -mood has been stable 5. Neuropsych: This patient is not capable of making decisions on her own behalf. 6. Skin/Wound Care: Routine skin checks 7. Fluids/Electrolytes/Nutrition: encouarge PO, RD follow up as well 8. Seizure disorder. Keppra 500 mg twice a day. Monitor for any seizure activity 9. Dysphagia. Dysphagia- nectar thick liquids/ D1.    -continues off IVF    -follow up labs  -megace has helped  -labs normal this week.  10. Hypertension. Currently off meds 11. Urinary frequency.   continue Flomax 0.4 mg daily at bedtime.     - urecholine-- 25mg  tid has been effective  -follow up ucx with 100k enterobacter---sens to bactrim---5 day course 12.  Orthostatic  Hypotension---flomax and lisinopril stopped BP     109/48-116/45       LOS (Days) 19 A FACE TO FACE EVALUATION WAS PERFORMED                                                               [facility] CWORDS,Darryll Raju HENRY 11/13/2014 6:44 AM

## 2014-11-13 NOTE — Progress Notes (Signed)
Physical Therapy Session Note  Patient Details  Name: Elenora GammaJoan L Camerer MRN: [id number] Date of Birth: November 06, 1945  Today's Date: 11/13/2014 PT Individual Time: 1100-1130 PT Individual Time Calculation (min): 30 min   Skilled Therapeutic Interventions/Progress Updates:  Patient in bed at the beginning of the session, difficult to motivate to participate. With increased cues patient agreed to get out of bed and performed basic grooming with CGA to assure decreased risk for falls due to LOB. Gait Training around the unit and towards the gym with CGA and multiple LOB noted ,self corrected 50 5 of time 50 % therapist assistance needed. When in day room patient sat down and refused any further walking, engaged in activities with playing cards(matching colors and symbols) to increase focus and attention.  Patient returned to room, instructed in techniques to make bed, returned to supine at the end of session- alarm on , all needs within reach.  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

## 2014-11-14 ENCOUNTER — Inpatient Hospital Stay (HOSPITAL_COMMUNITY): Payer: Medicare Other

## 2014-11-14 NOTE — Progress Notes (Signed)
  [place] PHYSICAL MEDICINE & REHABILITATION     PROGRESS NOTE    Subjective/Complaints: She feels well. No complaints other than the room being cold.  Objective: Vital Signs: Blood pressure 122/56, pulse 61, temperature 97.6 F (36.4 C), temperature source Axillary, resp. rate 18, height 5\' 4"  (1.626 m), weight 99 lb 8 oz (45.133 kg), SpO2 97 %.  Physical Exam:    Well-developed well-nourished female in no acute distress. HEENT exam atraumatic, normocephalic, extraocular muscles are intact. Neck is supple. No jugular venous distention no thyromegaly. Chest clear to auscultation without increased work of breathing. Cardiac exam S1 and S2 are regular. Abdominal exam active bowel sounds, soft, nontender. Extremities no edema. Neurologic exam she is alert   Assessment/Plan: 1. Functional deficits secondary to TBI   Medical Problem List and Plan: 1. Functional deficits secondary to TBI after a fall with left frontal, left temporal bleed and right cerebellar contusion as well as right basal skull fractures 2. DVT Prophylaxis/Anticoagulation: SCDs. Monitor for any signs of DVT 3. Pain Management: Tylenol as needed 4. Mood/bipolar disorder/dementia: Celexa 20 mg daily, Seroquel 300 mg twice a day, Ativan every 4 hours as needed. Fairly substantial dementia at baseline  -mood has been stable 5. Neuropsych: This patient is not capable of making decisions on her own behalf. 6. Skin/Wound Care: Routine skin checks 7. Fluids/Electrolytes/Nutrition:  Basic Metabolic Panel:    Component Value Date/Time   NA 138 11/10/2014 0654   K 4.2 11/10/2014 0654   CL 105 11/10/2014 0654   CO2 25 11/10/2014 0654   BUN 12 11/10/2014 0654   CREATININE 0.59 11/10/2014 0654   GLUCOSE 90 11/10/2014 0654   CALCIUM 9.3 11/10/2014 0654    8. Seizure disorder. Keppra 500 mg twice a day. Monitor for any seizure activity 9. Dysphagia. Dysphagia- nectar thick liquids/ D1.    -continues off IVF    -follow up  labs  -megace has helped  .  10. Hypertension. Currently off meds And blood pressure ranging 120/66-122/56. 11. Urinary frequency.   continue Flomax 0.4 mg daily at bedtime.     - urecholine-- 25mg  tid has been effective  -follow up ucx with 100k enterobacter---sens to bactrim---5 day course 12.  Orthostatic Hypotension---Seems to have resolved.  LOS (Days) 20 A FACE TO FACE EVALUATION WAS PERFORMED                                                               [facility] HospitalWOR[facility],Carlyn Lemke [facility] CampusENRY 11/14/2014 8:46 AM

## 2014-11-14 NOTE — Progress Notes (Signed)
Occupational Therapy Session Note  Patient Details  Name: Elenora GammaJoan L Gabbard MRN: [id number] Date of Birth: 21-Jul-1946  Today's Date: 11/14/2014 OT Individual Time: 1400-1430 OT Individual Time Calculation (min): 30 min    Short Term Goals: Week 2:  OT Short Term Goal 1 (Week 2): STG=LTG  Skilled Therapeutic Interventions/Progress Updates: Therapeutic activity with focus on sustained attention, problem solving, functional mobility, toilet transfer and toileting, grooming (hand hygiene).   Pt received asleep in bed but alert with mod vc to orient to task.   Pt receptive to play 2 games of "Connect Four" with therapist at edge of bed.   In controlled environment, pt sustained attention to task for 15 minutes although unable to recall strategy of game or recognize errors during task.   Pt returned to bed but was alerted to need to toilet by RN tech.   Pt returned to EOB and ambulated to bathroom, completed toilet transfer, toileted (clothing management and hgyiene) and returned to skin to complete hand hygiene with min instructional cues and standby assist.   Pt returned to bed and resumed resting with call light within reach and bed safety alarm enabled.     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

## 2014-11-15 ENCOUNTER — Inpatient Hospital Stay (HOSPITAL_COMMUNITY): Payer: Medicare Other | Admitting: Physical Therapy

## 2014-11-15 ENCOUNTER — Encounter (HOSPITAL_COMMUNITY): Payer: Medicare Other | Admitting: Speech Pathology

## 2014-11-15 ENCOUNTER — Inpatient Hospital Stay (HOSPITAL_COMMUNITY): Payer: Medicare Other

## 2014-11-15 NOTE — Discharge Summary (Signed)
Discharge summary job [id number]

## 2014-11-15 NOTE — Progress Notes (Signed)
Physical Therapy Discharge Summary  Patient Details  Name: Sharon [name] MRN: [id number] Date of Birth: 04-24-1946  Today's Date: 11/15/2014 PT Individual Time: 1430-1525 PT Individual Time Calculation (min): 55 min    Patient has met 12 of 12 long term goals due to improved activity tolerance, improved balance, improved postural control, increased strength, decreased pain, ability to compensate for deficits, functional use of  right upper extremity, right lower extremity, left upper extremity and left lower extremity, improved attention, improved awareness and improved coordination.  Patient to discharge at an ambulatory level Supervision.   Patient's care partner unavailable to provide the necessary physical and cognitive assistance at discharge.  Reasons goals not met: NA  Recommendation:  Patient will benefit from ongoing skilled PT services in skilled nursing facility setting to continue to advance safe functional mobility, address ongoing impairments in muscle weakness, decreased endurance, decreased standing balance, decreased balance strategies, decreased coordination, decreased motor planning, decreased initiation, decreased attention, decreased awareness, decreased problem solving, decreased safety awareness, decreased memory and delayed processing and minimize fall risk.  Equipment: No equipment provided  Reasons for discharge: treatment goals met and discharge from hospital  Patient/family agrees with progress made and goals achieved: Yes  Skilled Therapeutic Intervention Patient seen for session focused on initiation, sustained > selective attention, awareness, sequencing, problem solving, and activity tolerance with functional mobility tasks. See below for further discharge details. Patient required overall min verbal cues to initiate functional mobility at supervision level, including car transfer to sedan height with mod cues to buckle/unbuckle seat belt. Patient sustained  attention to simple cross pipeline tree puzzle in standing with diagram from field of 4 pieces and step-by-step cues for sequencing, with patient selecting correct pieces 100% of time and card game of war while seated with min cues for adherence to rules with seated rest break between tasks. Patient stated she did not feel well towards end of session, agreeable to retrieving ice cream before returning to room. Patient required min questioning cues for path finding on rehab unit. Patient consumed 25% of magic cup in room, required one cue to straighten up bed before lying down, and left in R sidelying with bed alarm on and 3 rails up, call bell within reach.   PT Discharge Precautions/Restrictions Precautions Precautions: Fall Restrictions Weight Bearing Restrictions: No Pain Pain Assessment Pain Assessment: No/denies pain Vision/Perception   Defer to OT discharge summary  Cognition Overall Cognitive Status: History of cognitive impairments - at baseline Arousal/Alertness: Awake/alert Orientation Level: Oriented to person;Oriented to place;Oriented to situation;Disoriented to time Attention: Sustained Sustained Attention: Impaired Sustained Attention Impairment: Verbal basic;Functional basic Memory: Impaired Memory Impairment: Retrieval deficit;Decreased recall of new information;Decreased short term memory Decreased Short Term Memory: Verbal basic;Functional basic Awareness: Impaired Awareness Impairment: Intellectual impairment Problem Solving: Impaired Problem Solving Impairment: Verbal basic;Functional basic Initiating: Appears intact Behaviors: Restless;Impulsive Safety/Judgment: Impaired Comments: Quick release belt, must be at nursing station if up in wheelchair and family is not present  [place] of Cognitive Functioning: Confused/appropriate Sensation Sensation Light Touch: Appears Intact Stereognosis: Not tested Hot/Cold: Appears Intact Proprioception:  Appears Intact Coordination Gross Motor Movements are Fluid and Coordinated: Yes Fine Motor Movements are Fluid and Coordinated: No Motor  Motor Motor: Abnormal postural alignment and control  Mobility Bed Mobility Bed Mobility: Supine to Sit;Sit to Supine Supine to Sit: 6: Modified independent (Device/Increase time);HOB flat Sit to Supine: 6: Modified independent (Device/Increase time);HOB flat Transfers Transfers: Yes Sit to Stand: 6: Modified independent (Device/Increase time) Stand  to Sit: 6: Modified independent (Device/Increase time) Stand Pivot Transfers: 5: Supervision Locomotion  Ambulation Ambulation: Yes Ambulation/Gait Assistance: 5: Supervision Ambulation Distance (Feet): 200 Feet Assistive device: None Gait Gait: Yes Gait Pattern: Impaired Gait Pattern: Step-through pattern;Decreased stride length;Narrow base of support Gait velocity: 10 MWT = 0.83 m/s Stairs / Additional Locomotion Stairs: Yes Stairs Assistance: 5: Supervision Stair Management Technique: Two rails;Step to pattern;Alternating pattern;Forwards (ascending alternating, descending step to pattern) Number of Stairs: 12 Height of Stairs: 6 Ramp: 5: Supervision Curb: Not tested (comment) Wheelchair Mobility Wheelchair Mobility: No (pt ambulatory)  Trunk/Postural Assessment  Cervical Assessment Cervical Assessment: Within Functional Limits Thoracic Assessment Thoracic Assessment: Within Functional Limits Lumbar Assessment Lumbar Assessment: Within Functional Limits Postural Control Postural Control: Deficits on evaluation Protective Responses: delayed/impaired  Balance Balance Balance Assessed: Yes Static Sitting Balance Static Sitting - Balance Support: Feet supported Static Sitting - Level of Assistance: 6: Modified independent (Device/Increase time) Dynamic Sitting Balance Dynamic Sitting - Balance Support: Feet supported;During functional activity Dynamic Sitting - Level of  Assistance: 6: Modified independent (Device/Increase time) Static Standing Balance Static Standing - Balance Support: No upper extremity supported;During functional activity Static Standing - Level of Assistance: 5: Stand by assistance Dynamic Standing Balance Dynamic Standing - Balance Support: During functional activity;No upper extremity supported Dynamic Standing - Level of Assistance: 5: Stand by assistance Extremity Assessment  RLE Assessment RLE Assessment: Within Functional Limits (unable to formally assess due to cognition) LLE Assessment LLE Assessment: Within Functional Limits (unable to formally assess due to cognition)  See FIM for current functional status  Laretta Alstrom 11/15/2014, 4:11 PM

## 2014-11-15 NOTE — Progress Notes (Signed)
Occupational Therapy Note  Patient Details  Name: Sharon [name] MRN: [id number] Date of Birth: 1945-10-19  Today's Date: 11/15/2014 OT Group Time: 1145-1200 (cotx with SLP-total time 1130-1200) OT Group Time Calculation (min): 15 min   Pt denied pain Group Therapy  Pt participated in self feeding group with focus on active participation, task initiation, sequencing, and safety awareness.  Pt required mod verbal cues for portion control and rate of self feeding.     Lavone NeriLanier, Damarys Speir [facility] 11/15/2014, 2:44 PM

## 2014-11-15 NOTE — Progress Notes (Signed)
Occupational Therapy Session Note  Patient Details  Name: Sharon [name] MRN: [id number] Date of Birth: 1945/08/03  Today's Date: 11/15/2014 OT Individual Time: [id number] OT Individual Time Calculation (min): 18 min    Short Term Goals: Week 2:  OT Short Term Goal 1 (Week 2): STG=LTG  Skilled Therapeutic Interventions/Progress Updates:    Pt seen for 1:1 OT session with focus on sustained attention, functional mobility, activity tolerance, dynamic standing balance, and cognitive remediation. Pt received sitting in w/c at nurses station in hospital gown. Began bathing at sink however pt only wanting to wash UB. Completed dressing with min A to fasten bra and supervision for standing balance during LB dressing. Pt assisted with changing water of flower vase and disposing of dead flowers at supervision level for functional mobility and mod cues to complete task. Pt initiated returning to bed and declining completing remainder of therapy session despite max multimodal cues. Pt left supine in bed with all needs in reach.   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), sits without warning Restrictions Weight Bearing Restrictions: No General:   Vital Signs: Therapy Vitals Temp: 98.5 F (36.9 C) Temp Source: Oral Pulse Rate: 69 Resp: 17 BP: 94/71 mmHg Patient Position (if appropriate): Lying Oxygen Therapy SpO2: 100 % O2 Device: Not Delivered Pain: Pain Assessment Faces Pain Scale: No hurt Pain Type: Acute pain Pain Location: Head Pain Descriptors / Indicators: Headache Pain Intervention(s): Medication (See eMAR) ADL:   Exercises:   Other Treatments:    See FIM for current functional status  Therapy/Group: Individual Therapy  Daneil Danerkinson, Reign Dziuba N 11/15/2014, 9:21 AM

## 2014-11-15 NOTE — Progress Notes (Signed)
Social Work Patient ID: Sharon GammaJoan L Farrelly, female   DOB: 06-Oct-1945, 69 y.o.   MRN: [id number]   [facility] and they can admit pt tomorrow.  Have discussed with pt's husband and he has accepted offer.  Plan to transport via ambulance.  Tx team aware.  Ioanna Colquhoun, LCSW

## 2014-11-15 NOTE — Progress Notes (Signed)
Speech Language Pathology Discharge Summary  Patient Details  Name: Sharon [name] MRN: [id number] Date of Birth: 1945/11/24  Today's Date: 11/15/2014 SLP Group Time: 1130-1145  SLP Group Time Calculation (min): 15 min   Skilled Therapeutic Interventions:  Pt was seen for skilled group ST targeting dysphagia goals.   Pt consumed presentations of dys 1 textures and nectar thick liquids with mod verbal and tactile cues for use of swallowing precautions (slow rate, small bites/sips).  Pt continues to present with impulsivity when self-feeding which resulted in overt s/s of aspiration characterized by immediate, reflexive cough following large boluses of both purees and nectar thick liquids.  S/s of aspiration were minimized with cues for rate and portion control.      Patient has met 6 of 6 long term goals.  Patient to discharge at overall Min;Mod level.  Reasons goals not met: n/a   Clinical Impression/Discharge Summary:  Pt made functional gains while inpatient and is discharging having met 6 out of 6 long term goals.  Pt is currently mod assist for basic, familiar tasks in a functional context; however, she can require up to max-total assist per motivation to participate in therapies.  Pt currently presents with decreased sustained attention to tasks, fluctuating orientation, poor recall of new information, and decreased functional problem solving.  Pt had significant cognitive deficits prior to admission and suspect pt is near her cognitive baseline.  Pt is currently consuming dys 1 textures and nectar thick liquids with full supervision for use of swallowing precautions.  Pt is impulsive when self feeding and needs frequent cues for rate and portion control; however, she has demonstrated small, inconsistent improvements in carryover of swallowing precautions with intermittent ability to adhere to compensatory strategies with min assist.  Similarly to cuing with cognitive tasks, pt's level of assist  fluctuates with motivation to participate in therapies.  Pt is discharging to SNF where it is recommended that she receive ST follow up to continue addressing cognition and dysphagia in order to maximize functional independence and reduce burden of care.  Recommend continuing family education at next level of care.     Care Partner:  Caregiver Able to Provide Assistance: Other (comment) (SNF)  Type of Caregiver Assistance:  (SNF)  Recommendation:  Skilled Nursing facility;24 hour supervision/assistance  Rationale for SLP Follow Up: Maximize functional communication;Maximize swallowing safety;Maximize cognitive function and independence;Reduce caregiver burden   Equipment: thickener   Reasons for discharge: Discharged from hospital   Patient/Family Agrees with Progress Made and Goals Achieved:  (pt unable to participate in goal setting )   See FIM for current functional status  Natika Geyer, Selinda Orion 11/15/2014, 3:50 PM

## 2014-11-15 NOTE — Discharge Summary (Signed)
NAMEREISA, COPPOLA NO.:  [id number]  MEDICAL RECORD NO.:  [id number]  LOCATION:  [id number]                        FACILITY:  [facility]  PHYSICIAN:  Ranelle Oyster, M.D.DATE OF BIRTH:  08/27/1945  DATE OF ADMISSION:  10/25/2014 DATE OF DISCHARGE:  11/16/2014                              DISCHARGE SUMMARY   DISCHARGE DIAGNOSES: 1. 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 2. SCDs for DVT prophylaxis. 3. Bipolar disorder. 4. Seizure disorder. 5. Dysphagia. 6. Hypertension. 7. Urinary frequency with Enterobacter urinary tract infection. 8. Orthostatic hypotension, resolved.  HISTORY OF PRESENT ILLNESS:  This is a 69 year old right-handed female with history of chronic systolic congestive heart failure, bipolar disorder, followed by Dr. Nolen Mu of Psychiatry Services.  She lives with her husband, sedentary prior to admission.  She was able to dress herself as well as feed herself prior to admission.  Presented on October 14, 2014, after a fall with left frontal, left temporal bleed and right cerebellar contusion with right basal skull fractures and posttraumatic seizures.  Neurosurgery, Dr. Jeral Fruit consulted, advised conservative care.  Echocardiogram with ejection fraction of 45%, no wall motion abnormalities.  Hospital course significant for UTI with antibiotics completed.  Swallow study, placed on a dysphagia 1 honey thick liquid diet.  Keppra added for seizure disorder.  The patient with slow progressive gains still very impulsive.  She was admitted for comprehensive rehab program.  PAST MEDICAL HISTORY:  See discharge diagnoses.  SOCIAL HISTORY:  Lives with her husband.  She was able to dress herself, feed herself, but no meal preparations prior to admission with assistance by her husband.  FUNCTIONAL STATUS:   Upon admission to rehab services, was moderate assist for upper lower body bathing, moderate assist toilet transfers, mod to max assist activities of daily living.  PHYSICAL EXAMINATION:  VITAL SIGNS:  Blood pressure 117/41, pulse 69, temperature 98, respirations 20. GENERAL:  This was an alert female, flat affect.  Makes good eye contact with examiner.  She would not initiate conversation, but she was able to provide her name and age, date of birth. LUNGS:  Clear to auscultation. CARDIAC:  Regular rate and rhythm. ABDOMEN:  Soft, nontender.  Good bowel sounds.  REHABILITATION HOSPITAL COURSE:  The patient was admitted to inpatient rehab services with therapies initiated on a 3-hour daily basis consisting of physical therapy, occupational therapy, speech therapy, and rehabilitation nursing.  The following issues were addressed during the patient's rehabilitation stay.  Pertaining to Ms. Syler's functional deficits secondary to TBI with left frontal temporal bleed, cerebellar contusion, and right basal skull fractures, conservative care as per Neurosurgery.  SCDs in place for DVT prophylaxis.  She did have a history of bipolar disorder, dementia.  She remained on Celexa 20 mg at bedtime as well as Seroquel 150 mg b.i.d.  She continued on Keppra 500 mg b.i.d. for seizure disorder.  No seizure activity noted.  She continued on a dysphagia 1 nectar thick liquid diet.  Close monitoring of hydration.  She  was on IV fluids for a short time with close monitoring of electrolytes.  Blood pressures well controlled.  After initial bouts of orthostasis, her lisinopril was discontinued.  Lasix currently on hold.  She was exhibiting no signs of fluid overload.  She was completing a course of Bactrim for an Enterobacter UTI.  No dysuria. Remained afebrile.  The patient received weekly collaborative interdisciplinary team conferences to discuss estimated length of stay, family teaching, and any  barriers to discharge.  Gait training around the unit towards the gym with contact guard assist, multiple loss of balance noted.  She could self correct 50% of the time.  Therapeutic activities focused on sustained attention, problem solving, functional mobility, toilet transfers.  She returned to the bed, but was alerted, need for toileting by the nurse tech.  Due to ongoing medical care, limited advances, it was felt by her husband who skilled nursing facility was needed with noted 24/7 supervision.  Bed became available on November 16, 2014.  DISCHARGE MEDICATIONS: 1. Urecholine 25 mg p.o. t.i.d. 2. Vitamin D 1000 units p.o. at bedtime. 3. Celexa 20 mg p.o. at bedtime. 4. Ferrous sulfate 325 mg p.o. b.i.d. 5. Keppra 500 mg p.o. b.i.d. 6. Protonix 40 mg p.o. daily. 7. Seroquel 150 mg p.o. b.i.d. 8. Bactrim 400/80 mg one tablet every 12 hours x2 days and stop. 9. Vitamin B12 1000 mcg p.o. at bedtime. 10.Zinc sulfate 220 mg p.o. daily. Her diet was a dysphagia 1 nectar thick liquid. 11.Lasix 20mg  daily as needed clinically for any signs of fluid overload 12.Flomax 0.4 mg daily   Close monitoring of electrolytes ,weigh the patient daily.  She would follow up with Dr. Faith RogueZachary Swartz at the outpatient rehab service office on January 11, 2015, Dr. Jeral FruitBotero, Neurosurgery as needed.     Mariam Dollaraniel Serafina Topham, P.A.   ______________________________ Ranelle OysterZachary T. Swartz, M.D.    DA/MEDQ  D:  11/15/2014  T:  11/15/2014  Job:  [id number]  cc:   Hilda LiasErnesto Botero, M.D. Gwen PoundsJohn M Russo, MD Ranelle OysterZachary T. Swartz, M.D.

## 2014-11-15 NOTE — Progress Notes (Signed)
Occupational Therapy Discharge Summary  Patient Details  Name: Sharon [name] MRN: [id number] Date of Birth: May 23, 1946  Today's Date: 11/15/2014  Patient has met 10 of 12 long term goals due to improved activity tolerance, improved balance, postural control, ability to compensate for deficits, functional use of  LEFT upper extremity, improved attention, improved awareness and improved coordination.  Patient to discharge at overall Supervision level for self-care tasks and functional mobility with mod-total A for cognitive impairments.  Patient's care partner not necessary to provide the necessary physical and cognitive assistance at discharge as patient is discharging to a SNF.    Reasons goals not met: Patient did not meet UB dressing goal as she requires min A to fasten bra. Patient did not meet memory goal secondary to requiring max-total A for memory at this time.   Recommendation:  Patient will benefit from ongoing skilled OT services in skilled nursing facility setting to continue to advance functional skills in the area of BADLs, cognitive remediation, balance, activity tolerance, strengthening, and minimize fall risk.  Equipment: No equipment provided  Reasons for discharge: treatment goals met and discharge from hospital  Patient/family agrees with progress made and goals achieved: Yes  OT Discharge Precautions/Restrictions  Precautions Precautions: Fall Restrictions Weight Bearing Restrictions: No General   Vital Signs Therapy Vitals Temp: 99.2 F (37.3 C) Temp Source: Oral Pulse Rate: 68 Resp: 16 BP: (!) 103/43 mmHg Patient Position (if appropriate): Lying Pain Pain Assessment Pain Assessment: No/denies pain ADL   Vision/Perception  Vision- History Baseline Vision/History: Wears glasses Patient Visual Report: No change from baseline Vision- Assessment Additional Comments: unable to formally assess due to cognitive impairments; pt tracks all quadrants with  verbal cues and additional head turns  Cognition Overall Cognitive Status: History of cognitive impairments - at baseline Arousal/Alertness: Awake/alert Orientation Level: Oriented to person;Oriented to place;Oriented to situation;Disoriented to time Attention: Sustained Sustained Attention: Impaired Sustained Attention Impairment: Verbal basic;Functional basic Memory: Impaired Memory Impairment: Retrieval deficit;Decreased recall of new information;Decreased short term memory Decreased Short Term Memory: Verbal basic;Functional basic Awareness: Impaired Awareness Impairment: Intellectual impairment Problem Solving: Impaired Problem Solving Impairment: Verbal basic;Functional basic Initiating: Appears intact Behaviors: Restless;Impulsive Safety/Judgment: Impaired Comments: Quick release belt, must be at nursing station if up in wheelchair and family is not present  [place] of Cognitive Functioning: Confused/appropriate Sensation Sensation Light Touch: Appears Intact Stereognosis: Not tested Hot/Cold: Appears Intact Proprioception: Appears Intact Coordination Gross Motor Movements are Fluid and Coordinated: Yes Fine Motor Movements are Fluid and Coordinated: No Finger Nose Finger Test: questionable dyskinesia PTA Motor  Motor Motor: Abnormal postural alignment and control Mobility  Bed Mobility Bed Mobility: Supine to Sit;Sit to Supine Supine to Sit: 6: Modified independent (Device/Increase time);HOB flat Sit to Supine: 6: Modified independent (Device/Increase time);HOB flat Transfers Transfers: Sit to Stand;Stand to Sit Sit to Stand: 6: Modified independent (Device/Increase time) Stand to Sit: 6: Modified independent (Device/Increase time)  Trunk/Postural Assessment  Cervical Assessment Cervical Assessment: Within Functional Limits Thoracic Assessment Thoracic Assessment: Within Functional Limits Lumbar Assessment Lumbar Assessment: Within Functional  Limits Postural Control Postural Control: Deficits on evaluation Protective Responses: delayed/impaired  Balance Balance Balance Assessed: Yes Static Sitting Balance Static Sitting - Balance Support: Feet supported Static Sitting - Level of Assistance: 6: Modified independent (Device/Increase time) Dynamic Sitting Balance Dynamic Sitting - Balance Support: Feet supported;During functional activity Dynamic Sitting - Level of Assistance: 6: Modified independent (Device/Increase time) Static Standing Balance Static Standing - Balance Support: No upper extremity supported;During functional  activity Static Standing - Level of Assistance: 5: Stand by assistance Dynamic Standing Balance Dynamic Standing - Balance Support: During functional activity;No upper extremity supported Dynamic Standing - Level of Assistance: 5: Stand by assistance Extremity/Trunk Assessment RUE Assessment RUE Assessment: Within Functional Limits LUE Assessment LUE Assessment: Within Functional Limits  See FIM for current functional status  Duayne Cal 11/15/2014, 4:39 PM

## 2014-11-15 NOTE — Progress Notes (Signed)
[place] PHYSICAL MEDICINE & REHABILITATION     PROGRESS NOTE    Subjective/Complaints: No new problems ROS limited by cognition  Objective: Vital Signs: Blood pressure 94/71, pulse 69, temperature 98.5 F (36.9 C), temperature source Oral, resp. rate 17, height 5\' 4"  (1.626 m), weight 45.813 kg (101 lb), SpO2 100 %. No results found. No results for input(s): WBC, HGB, HCT, PLT in the last 72 hours. No results for input(s): NA, K, CL, GLUCOSE, BUN, CREATININE, CALCIUM in the last 72 hours.  Invalid input(s): CO CBG (last 3)  No results for input(s): GLUCAP in the last 72 hours.  Wt Readings from Last 3 Encounters:  11/15/14 45.813 kg (101 lb)  10/25/14 51.302 kg (113 lb 1.6 oz)  07/09/14 49.17 kg (108 lb 6.4 oz)    Physical Exam:  Constitutional:   No acute distress HENT:  Head: Normocephalic.  Eyes: EOM are normal. Pupils are equal, round, and reactive to light.     Neck: Normal range of motion. Neck supple. No tracheal deviation present. No thyromegaly present.  Cardiovascular: Normal rate and regular rhythm.  No murmur heard. Respiratory: Effort normal and breath sounds normal. No respiratory distress. She has no wheezes. She has no rales.  GI: Soft. Bowel sounds are normal. She exhibits no distension. There is no tenderness. There is no rebound.  Musculoskeletal: She exhibits low back tenderness. She exhibits no edema.  She moves all extremities  Neurological: Coordination abnormal.  Patient is alert flat effect. Follows simple commands. Oriented to name and hospital---not time,reason. Moves all 4's fairly equally with inconsistent effort. Skin: Skin is warm and dry.  Psychiatric:  Remains confused but cooperative  Assessment/Plan: 1. Functional deficits secondary to TBI which require 3+ hours per day of interdisciplinary therapy in a comprehensive inpatient rehab setting. Physiatrist is providing close team supervision and 24 hour management of active  medical problems listed below. Physiatrist and rehab team continue to assess barriers to discharge/monitor patient progress toward functional and medical goals.  Placement pending   FIM: FIM - Bathing Bathing Steps Patient Completed: Right Arm, Left Arm, Right upper leg, Left upper leg, Abdomen, Chest, Front perineal area, Buttocks, Right lower leg (including foot), Left lower leg (including foot) Bathing: 5: Supervision: Safety issues/verbal cues  FIM - Upper Body Dressing/Undressing Upper body dressing/undressing steps patient completed: Thread/unthread right bra strap, Thread/unthread left bra strap, Thread/unthread right sleeve of pullover shirt/dresss, Thread/unthread left sleeve of pullover shirt/dress, Put head through opening of pull over shirt/dress, Pull shirt over trunk Upper body dressing/undressing: 4: Min-Patient completed 75 plus % of tasks FIM - Lower Body Dressing/Undressing Lower body dressing/undressing steps patient completed: Thread/unthread right pants leg, Thread/unthread left pants leg, Pull pants up/down, Don/Doff right shoe, Don/Doff left shoe, Thread/unthread right underwear leg, Thread/unthread left underwear leg, Pull underwear up/down, Fasten/unfasten right shoe, Fasten/unfasten left shoe, Don/Doff right sock, Don/Doff left sock Lower body dressing/undressing: 5: Supervision: Safety issues/verbal cues  FIM - Toileting Toileting steps completed by patient: Adjust clothing prior to toileting, Performs perineal hygiene, Adjust clothing after toileting Toileting Assistive Devices: Grab bar or rail for support Toileting: 5: Supervision: Safety issues/verbal cues  FIM - Diplomatic Services operational officerToilet Transfers Toilet Transfers Assistive Devices: Therapist, musicGrab bars Toilet Transfers: 5-To toilet/BSC: Supervision (verbal cues/safety issues), 5-From toilet/BSC: Supervision (verbal cues/safety issues)  FIM - BankerBed/Chair Transfer Bed/Chair Transfer Assistive Devices: Bed rails Bed/Chair Transfer: 6:  Supine > Sit: No assist, 5: Bed > Chair or W/C: Supervision (verbal cues/safety issues), 6: Chair or W/C > Bed: No  assist, 6: Sit > Supine: No assist  FIM - Locomotion: Wheelchair Locomotion: Wheelchair: 0: Activity did not occur FIM - Locomotion: Ambulation Locomotion: Ambulation Assistive Devices: Other (comment) (none) Ambulation/Gait Assistance: 5: Supervision, 4: Min guard Locomotion: Ambulation: 4: Travels 150 ft or more with minimal assistance (Pt.>75%)  Comprehension Comprehension Mode: Auditory Comprehension: 3-Understands basic 50 - 74% of the time/requires cueing 25 - 50%  of the time  Expression Expression Mode: Verbal Expression: 3-Expresses basic 50 - 74% of the time/requires cueing 25 - 50% of the time. Needs to repeat parts of sentences.  Social Interaction Social Interaction: 2-Interacts appropriately 25 - 49% of time - Needs frequent redirection.  Problem Solving Problem Solving: 2-Solves basic 25 - 49% of the time - needs direction more than half the time to initiate, plan or complete simple activities  Memory Memory: 2-Recognizes or recalls 25 - 49% of the time/requires cueing 51 - 75% of the time  Medical Problem List and Plan: 1. Functional deficits secondary to TBI after a fall with left frontal, left temporal bleed and right cerebellar contusion as well as right basal skull fractures 2. DVT Prophylaxis/Anticoagulation: SCDs. Monitor for any signs of DVT 3. Pain Management: Tylenol as needed 4. Mood/bipolar disorder/dementia: Celexa 20 mg daily, Seroquel 300 mg twice a day, Ativan every 4 hours as needed. Fairly substantial dementia at baseline  -mood has been stable 5. Neuropsych: This patient is not capable of making decisions on her own behalf. 6. Skin/Wound Care: Routine skin checks 7. Fluids/Electrolytes/Nutrition: encouarge PO, RD follow up as well 8. Seizure disorder. Keppra 500 mg twice a day. Monitor for any seizure activity 9. Dysphagia.  Dysphagia- nectar thick liquids/ D1.    -continues off IVF    -follow up labs  -megace has helped  -labs recently normal 10. Hypertension. Lasix 20 mg daily (hold), lisinopril 2.5 mg daily. Monitor with increased mobility  -recent labs normal.   -holding lasix 11. Urinary frequency.   continue Flomax 0.4 mg daily at bedtime.   -Urine + 100k enterobacter--->>bactrim 12.  Orthostatic Hypotension---flomax and lisinopril stopped   LOS (Days) 21 A FACE TO FACE EVALUATION WAS PERFORMED                                                               SWARTZ,ZACHARY T 11/15/2014 7:59 AM

## 2014-11-15 NOTE — Progress Notes (Signed)
NUTRITION FOLLOW UP  Pt meets criteria for SEVERE MALNUTRITION in the context of acute illness/injury as evidenced by a 10% weight loss in 9 days and moderate muscle mass loss.  DOCUMENTATION CODES Per approved criteria  -Severe malnutrition in the context of acute illness or injury -Underweight   INTERVENTION: Continue Ensure Pudding po TID, each supplement provides 170 kcal and 4 grams of protein.  Provide Magic cup TID between meals, each supplement provides 290 kcal and 9 grams of protein  Provide Ensure Enlive po BID PRN, each supplement provides 350 kcal and 20 grams of protein.  Encourage adequate PO intake.  NUTRITION DIAGNOSIS: Malnutrition related to inadequate oral intake as evidenced by weight loss and moderate muscle mass loss; ongoing  Goal: Pt to meet >/= 90% of their estimated nutrition needs; met  Monitor:  PO intake, weight trends, labs, I/O's  69 y.o. 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 Presented 10/14/2014 after a fall with left frontal, L temporal bleed &R cerebellar contusion, R basal skull fxs,and post traumatic seizures.  Pt is currently on a dysphagia 1 diet with nectar thick liquids. Meal completion has been varied from 25-100%, however most meals has been 100%. Intake has improved. Pt has been consuming her Ensure pudding. Will continue with current orders.   Labs and medications reviewed.  Height: Ht Readings from Last 1 Encounters:  10/25/14 5' 4" (1.626 m)    Weight: Wt Readings from Last 1 Encounters:  11/15/14 101 lb (45.813 kg)  10/26/14 109 lbs  BMI:  Body mass index is 17.33 kg/(m^2). Underweight  Re-Estimated Nutritional Needs: Kcal: 1500-1700 Protein: 65-75 grams Fluid: >/= 1.5 L/day  Skin: Intact  Diet Order: DIET - DYS 1 Room service appropriate?: Yes; Fluid consistency::  Nectar Thick   Intake/Output Summary (Last 24 hours) at 11/15/14 1256 Last data filed at 11/15/14 1200  Gross per 24 hour  Intake    720 ml  Output      0 ml  Net    720 ml    Last BM: 4/23  Labs:   Recent Labs Lab 11/10/14 0654  NA 138  K 4.2  CL 105  CO2 25  BUN 12  CREATININE 0.59  CALCIUM 9.3  GLUCOSE 90    CBG (last 3)  No results for input(s): GLUCAP in the last 72 hours.  Scheduled Meds: . antiseptic oral rinse  7 mL Mouth Rinse BID  . bethanechol  25 mg Oral TID  . cholecalciferol  1,000 Units Oral QHS  . citalopram  20 mg Oral QHS  . feeding supplement (ENSURE)  1 Container Oral TID BM  . ferrous sulfate  325 mg Oral BID WC  . levETIRAcetam  500 mg Oral BID  . megestrol  400 mg Oral BID  . pantoprazole  40 mg Oral Daily  . QUEtiapine  150 mg Oral BID  . sulfamethoxazole-trimethoprim  1 tablet Oral Q12H  . cyanocobalamin  1,000 mcg Oral QHS  . zinc sulfate  220 mg Oral Daily    Continuous Infusions:    Past Medical History  Diagnosis Date  . Bipolar 1 disorder   . Dementia   . Other pancytopenia 06/03/2014  . Stroke     Past Surgical History  Procedure Laterality Date  . Cholecystectomy      Kallie Locks, MS, RD, LDN Pager # [phone] After hours/ weekend pager #  [phone]

## 2014-11-15 NOTE — Plan of Care (Signed)
Problem: RH Dressing Goal: LTG Patient will perform upper body dressing (OT) LTG Patient will perform upper body dressing with assist, with/without cues (OT).  Outcome: Not Met (add Reason) Not met as pt requires min A to fasten bra  Problem: RH Memory Goal: LTG Patient will demonstrate ability for day to day (OT) LTG: Patient will demonstrate ability for day to day recall/carryover during activities of daily living with assist (OT)  Outcome: Not Met (add Reason) Pt requires max-total A for memory

## 2014-11-16 MED ORDER — MEGESTROL ACETATE 400 MG/10ML PO SUSP
400.0000 mg | Freq: Every day | ORAL | Status: DC
  Filled 2014-11-16: qty 10

## 2014-11-16 NOTE — Progress Notes (Signed)
Discharge instructions given. Pt with family at dc. [facility] arrived at 1045 to transport pt to SNF. [facility]. Report called to receiving Nurse. Pt has all belongings with her. Careplan and education completed. Pt discharged at this time.

## 2014-11-16 NOTE — Progress Notes (Signed)
[place] PHYSICAL MEDICINE & REHABILITATION     PROGRESS NOTE    Subjective/Complaints: No new complaints. Pleasantly confused ROS limited by cognition  Objective: Vital Signs: Blood pressure 135/57, pulse 63, temperature 97.7 F (36.5 C), temperature source Axillary, resp. rate 18, height  (1.626 m), weight 45.859 kg (101 lb 1.6 oz), SpO2 99 %. No results found. No results for input(s): WBC, HGB, HCT, PLT in the last 72 hours. No results for input(s): NA, K, CL, GLUCOSE, BUN, CREATININE, CALCIUM in the last 72 hours.  Invalid input(s): CO CBG (last 3)  No results for input(s): GLUCAP in the last 72 hours.  Wt Readings from Last 3 Encounters:  11/16/14 45.859 kg (101 lb 1.6 oz)  10/25/14 51.302 kg (113 lb 1.6 oz)  07/09/14 49.17 kg (108 lb 6.4 oz)    Physical Exam:  Constitutional:   No acute distress HENT:  Head: Normocephalic.  Eyes: EOM are normal. Pupils are equal, round, and reactive to light.     Neck: Normal range of motion. Neck supple. No tracheal deviation present. No thyromegaly present.  Cardiovascular: Normal rate and regular rhythm.  No murmur heard. Respiratory: Effort normal and breath sounds normal. No respiratory distress. She has no wheezes. She has no rales.  GI: Soft. Bowel sounds are normal. She exhibits no distension. There is no tenderness. There is no rebound.  Musculoskeletal: She exhibits low back tenderness. She exhibits no edema.  She moves all extremities  Neurological: Coordination abnormal.  Patient is alert flat effect. Follows simple commands. Oriented to name and hospital---not time,reason. Moves all 4's fairly equally with inconsistent effort. Skin: Skin is warm and dry.  Psychiatric:  Remains confused but cooperative  Assessment/Plan: 1. Functional deficits secondary to TBI which require 3+ hours per day of interdisciplinary therapy in a comprehensive inpatient rehab setting. Physiatrist is providing close team  supervision and 24 hour management of active medical problems listed below. Physiatrist and rehab team continue to assess barriers to discharge/monitor patient progress toward functional and medical goals.  Placement pending   FIM: FIM - Bathing Bathing Steps Patient Completed: Right Arm, Left Arm, Right upper leg, Left upper leg, Abdomen, Chest, Front perineal area, Buttocks, Right lower leg (including foot), Left lower leg (including foot) Bathing: 5: Supervision: Safety issues/verbal cues  FIM - Upper Body Dressing/Undressing Upper body dressing/undressing steps patient completed: Thread/unthread right bra strap, Thread/unthread left bra strap, Thread/unthread right sleeve of pullover shirt/dresss, Thread/unthread left sleeve of pullover shirt/dress, Put head through opening of pull over shirt/dress, Pull shirt over trunk Upper body dressing/undressing: 4: Min-Patient completed 75 plus % of tasks FIM - Lower Body Dressing/Undressing Lower body dressing/undressing steps patient completed: Thread/unthread right pants leg, Thread/unthread left pants leg, Pull pants up/down, Don/Doff right sock, Don/Doff left sock Lower body dressing/undressing: 5: Supervision: Safety issues/verbal cues  FIM - Toileting Toileting steps completed by patient: Adjust clothing prior to toileting, Performs perineal hygiene, Adjust clothing after toileting Toileting Assistive Devices: Grab bar or rail for support Toileting: 5: Supervision: Safety issues/verbal cues  FIM - Diplomatic Services operational officer Devices: Therapist, music Transfers: 5-To toilet/BSC: Supervision (verbal cues/safety issues), 5-From toilet/BSC: Supervision (verbal cues/safety issues)  FIM - Banker Devices: Bed rails Bed/Chair Transfer: 6: Supine > Sit: No assist, 5: Bed > Chair or W/C: Supervision (verbal cues/safety issues), 6: Chair or W/C > Bed: No assist, 6: Sit > Supine: No  assist  FIM - Locomotion: Wheelchair Locomotion: Wheelchair: 0: Activity did  not occur (pt ambulatory) FIM - Locomotion: Ambulation Locomotion: Ambulation Assistive Devices: Other (comment) (none) Ambulation/Gait Assistance: 5: Supervision Locomotion: Ambulation: 5: Travels 150 ft or more with supervision/safety issues  Comprehension Comprehension Mode: Auditory Comprehension: 3-Understands basic 50 - 74% of the time/requires cueing 25 - 50%  of the time  Expression Expression Mode: Verbal Expression: 4-Expresses basic 75 - 89% of the time/requires cueing 10 - 24% of the time. Needs helper to occlude trach/needs to repeat words.  Social Interaction Social Interaction: 3-Interacts appropriately 50 - 74% of the time - May be physically or verbally inappropriate.  Problem Solving Problem Solving: 3-Solves basic 50 - 74% of the time/requires cueing 25 - 49% of the time  Memory Memory: 3-Recognizes or recalls 50 - 74% of the time/requires cueing 25 - 49% of the time  Medical Problem List and Plan: 1. Functional deficits secondary to TBI after a fall with left frontal, left temporal bleed and right cerebellar contusion as well as right basal skull fractures 2. DVT Prophylaxis/Anticoagulation: SCDs. Monitor for any signs of DVT 3. Pain Management: Tylenol as needed 4. Mood/bipolar disorder/dementia: Celexa 20 mg daily, Seroquel 300 mg twice a day, Ativan every 4 hours as needed. Fairly substantial dementia at baseline  -mood has been stable 5. Neuropsych: This patient is not capable of making decisions on her own behalf. 6. Skin/Wound Care: Routine skin checks 7. Fluids/Electrolytes/Nutrition: encouarge PO, RD follow up as well 8. Seizure disorder. Keppra 500 mg twice a day. Monitor for any seizure activity 9. Dysphagia. Dysphagia- nectar thick liquids/ D1.    -continues off IVF    -follow up labs  -megace has helped  -labs recently normal 10. Hypertension. Lasix 20 mg daily (would  continue to hold), lisinopril 2.5 mg daily.   -recent labs normal.   -holding lasix 11. Urinary frequency.   continue Flomax 0.4 mg daily at bedtime.   -Urine + 100k enterobacter--->>bactrim  -dc urecholine 12.  Orthostatic Hypotension---flomax and lisinopril stopped   LOS (Days) 22 A FACE TO FACE EVALUATION WAS PERFORMED                                                               Ander Wamser T 11/16/2014 8:00 AM

## 2014-11-16 NOTE — Progress Notes (Signed)
Social Work  Discharge Note  The overall goal for the admission was met for:   Discharge location: No - plan changed to SNF per husband choice  Length of Stay: No - LOS extended slightly due to bed search.  LOS = 22 days  Discharge activity level: Yes - minimal assistance  Home/community participation: Yes  Services provided included: MD, RD, PT, OT, SLP, RN, TR, Pharmacy and SW  Financial Services: Medicare and Private Insurance: [place]  Follow-up services arranged: Other: SNF at [facility]   Comments (or additional information):  Patient/Family verbalized understanding of follow-up arrangements: Yes  Individual responsible for coordination of the follow-up plan: husband  Confirmed correct DME delivered: NA    Alyne Martinson

## 2014-11-26 ENCOUNTER — Other Ambulatory Visit (HOSPITAL_COMMUNITY): Payer: Self-pay | Admitting: Geriatric Medicine

## 2014-11-26 DIAGNOSIS — R1314 Dysphagia, pharyngoesophageal phase: Secondary | ICD-10-CM

## 2014-11-30 ENCOUNTER — Other Ambulatory Visit (HOSPITAL_COMMUNITY): Payer: Self-pay | Admitting: Geriatric Medicine

## 2014-12-01 ENCOUNTER — Ambulatory Visit (HOSPITAL_COMMUNITY)
Admission: RE | Admit: 2014-12-01 | Discharge: 2014-12-01 | Disposition: A | Discharge status: Home / Self Care | Payer: Medicare Other | Source: Ambulatory Visit | Attending: Geriatric Medicine | Admitting: Geriatric Medicine

## 2014-12-01 DIAGNOSIS — R1310 Dysphagia, unspecified: Secondary | ICD-10-CM | POA: Diagnosis present

## 2014-12-01 DIAGNOSIS — R1314 Dysphagia, pharyngoesophageal phase: Secondary | ICD-10-CM

## 2014-12-01 NOTE — Procedures (Signed)
Objective Swallowing Evaluation:  (MBS)  Patient Details  Name: Sharon [name] MRN: [id number] Date of Birth: 17-May-1946  Today's Date: 12/01/2014 Time: SLP Start Time (ACUTE ONLY): 1300  Stop Time (ACUTE ONLY): 1355 SLP Time Calculation (min) (ACUTE ONLY): 55  Past Medical History:  Past Medical History  Diagnosis Date  . Bipolar 1 disorder   . Dementia   . Other pancytopenia 06/03/2014  . Stroke    Past Surgical History:  Past Surgical History  Procedure Laterality Date  . Cholecystectomy     HPI:  Other Pertinent Information: 69 yo female with h/o fall/seizure-TBI, advanced dementia and bipolar disorder referred for repeat MBS.  Pt has undergone MBS studies previously with recommendation for dys1/nectar on last test 11/05/14. Visit today to determiner readiness for dietary advancement. Pt  resides at SNF and takes her medicine with applesauce.  She reports weight loss and that she does not use her dentures.    No Data Recorded  Assessment / Plan / Recommendation CHL IP CLINICAL IMPRESSIONS 11/09/2014  Therapy Diagnosis Mild oropharyngeal dysphagia  Clinical Impression  Pt presents with improved swallow ability compared to previous MBS - no aspiration or penetration present with any consistency tested (thin straw, cup, nectar cup, puree, cracker, pill with puree). Decreased oral coordination noted - *? If pt's oral deficits could be consistent with tardive dyskinesia.  Piece-mealing noted across consistencies.    Pt with poor "mastication" ability (edentulous) resulting in pharyngeal transiting of large cracker bolus with delayed swallow reflex to over epiglottis/posterior pharynx.  However, pt was protective of her airway. Delayed swallow initiation to the vallecular and pyriform sinuses noted due to decreased sensation for primary trigger site.  Mild residuals noted across consistencies (worse at vallecular space with solids/pudding) without pt having consistent sensation-liquids aid  clearance.  Mild amount of pharyngeal residuals with liquids and cued dry swallows assisted to clear.   Barium tablet was given with pudding was swallowed adequately without severe residuals.   Using live video, SLP provided education to pt re: swallow evaluation results and compensation strategy usage.    Esophageal sweep following the barium tablet appeared with adequate clearance of esophagus.  Pt performance was improved today as compared to previous MBS studies and this SLP recommends to consider dietary advancement.   Encouraged pt to consider using dentures for maximal mastication efficiency.        CHL IP TREATMENT RECOMMENDATION 11/09/2014  Treatment Recommendations Therapy as outlined in treatment plan below     CHL IP DIET RECOMMENDATION 12/01/2014  SLP Diet Recommendations Dysphagia 3 (Mech soft) (with dentures);Thin  Liquid Administration via Cup,straw  Medication Administration Whole meds with puree  Compensations Slow rate;Small sips/bites;Check for pocketing;Follow solids with liquid, Intermittent dry swallow     CHL IP OTHER RECOMMENDATIONS 11/14/2014  Recommended Consults (None)  Oral Care Recommendations Oral care BID  Other Recommendations Follow up at current placement               CHL IP REASON FOR REFERRAL 12/01/2014  Reason for Referral Objectively evaluate swallowing function     CHL IP ORAL PHASE 12/01/2014  Oral Phase Impaired      CHL IP PHARYNGEAL PHASE 12/01/2014  Pharyngeal Phase Impaired  Pharyngeal Comment liquid swallow aid clearance, dry swallows assist to clear pharyngeal residuals      CHL IP CERVICAL ESOPHAGEAL PHASE 12/01/2014  Cervical Esophageal Phase WFL    CHL IP GO 12/01/2014  Functional Assessment Tool Used mbs, clinical judgement  Functional Limitations  Swallowing  Swallow Current Status [phone]([id number]) CJ  Swallow Goal Status ([id number]) CJ  Swallow Discharge Status [phone]([id number]) Jen MowCJ           Takoda Janowiak, MS [facility] SLP [phone]

## 2014-12-02 ENCOUNTER — Telehealth: Payer: Self-pay | Admitting: *Deleted

## 2014-12-02 ENCOUNTER — Telehealth: Payer: Self-pay | Admitting: Hematology

## 2014-12-02 DIAGNOSIS — S069X9A Unspecified intracranial injury with loss of consciousness of unspecified duration, initial encounter: Secondary | ICD-10-CM

## 2014-12-02 NOTE — Telephone Encounter (Signed)
pt spouse cld to r/s appt from Feb stated pt hadf fell and had many problems-gave pt time & date

## 2014-12-02 NOTE — Telephone Encounter (Signed)
[name] called about Sharon [name] and asked that he be called.

## 2014-12-03 ENCOUNTER — Ambulatory Visit (HOSPITAL_COMMUNITY): Payer: Medicare Other

## 2014-12-03 NOTE — Telephone Encounter (Signed)
I spoke with Mr. Sharon [name]. [facility] OT, SLP

## 2014-12-03 NOTE — Telephone Encounter (Signed)
I called back and spoke with Mr Sharon [name].  Sharon [name] was discharged [facility].  He has concerns about whether any further rehab will do anything for [name].  He is going to try the best he can to assist her.  I believe he spoke with Lucy yesterday. She does not have an appointment until 01/11/15.  Do you have any suggestions?

## 2014-12-06 ENCOUNTER — Telehealth: Payer: Self-pay | Admitting: *Deleted

## 2014-12-06 NOTE — Telephone Encounter (Signed)
Hospice?  She was doing well when she left rehab. i spoke to husband a week or two ago.  This patient needs to be seen by a clinician---Dr Timothy Lassousso, me, Riley LamEunice. I will not make those orders without seeing the patient.

## 2014-12-06 NOTE — Telephone Encounter (Signed)
Sharon AspCindy called from [facility] and they tried to do a PT start of care this past Saturday on Sharon [name] and she was unable to participate in the therapy.  They are wondering if hospice or palliative care referral might be appropriate for Sharon [name].  They are asking what Dr Riley KillSwartz thinks.

## 2014-12-07 ENCOUNTER — Telehealth: Payer: Self-pay | Admitting: *Deleted

## 2014-12-07 NOTE — Telephone Encounter (Signed)
They contacted Dr Timothy Lassousso and he is following up on this.

## 2014-12-07 NOTE — Telephone Encounter (Signed)
VM message from pt's husband stating to cancel her upcoming appts on 12/09/14. He indicated hospice was involved with her care and that he would update us later.

## 2014-12-08 NOTE — Telephone Encounter (Signed)
Note to Dr Mosetta PuttFeng.

## 2014-12-09 ENCOUNTER — Ambulatory Visit: Payer: Medicare Other | Admitting: Hematology

## 2014-12-09 ENCOUNTER — Other Ambulatory Visit: Payer: Medicare Other

## 2015-01-03 ENCOUNTER — Other Ambulatory Visit: Payer: Self-pay | Admitting: Hematology

## 2015-01-05 NOTE — Telephone Encounter (Signed)
Spoke with [facility], Joylene Igo, Environmental education officer.Prescription to be refilled by pt's PCP, Dr. Timothy Lasso, per [facility].  Notified [facility] of pt's attending physician.

## 2015-01-11 ENCOUNTER — Encounter: Attending: Physical Medicine & Rehabilitation | Admitting: Physical Medicine & Rehabilitation

## 2015-01-11 ENCOUNTER — Encounter: Payer: Self-pay | Admitting: Physical Medicine & Rehabilitation

## 2015-01-11 VITALS — BP 111/63 | HR 74 | Resp 14

## 2015-01-11 DIAGNOSIS — S069X9A Unspecified intracranial injury with loss of consciousness of unspecified duration, initial encounter: Secondary | ICD-10-CM | POA: Diagnosis not present

## 2015-01-11 DIAGNOSIS — S06324S Contusion and laceration of left cerebrum with loss of consciousness of 6 hours to 24 hours, sequela: Secondary | ICD-10-CM | POA: Diagnosis not present

## 2015-01-11 DIAGNOSIS — F03918 Unspecified dementia, unspecified severity, with other behavioral disturbance: Secondary | ICD-10-CM

## 2015-01-11 DIAGNOSIS — F0391 Unspecified dementia with behavioral disturbance: Secondary | ICD-10-CM | POA: Diagnosis not present

## 2015-01-11 NOTE — Patient Instructions (Signed)
DECREASE SEROQUEL DURING THE DAY AS MUCH AS YOU CAN TO ALLOW FOR BETTER  AROUSAL DURING THE DAY  AVOID EXCESSIVE FLUID INTAKE AFTER DINNER TO DECREASE URINARY FREQUENCY AT NIGHT.

## 2015-01-11 NOTE — Progress Notes (Signed)
Subjective:    Patient ID: Sharon [name], female    DOB: June 05, 1946, 69 y.o.   MRN: [id number]  HPI   Sharon [name] is here in follow up of her dementia and TBI. Her husband brought her home in mid May and began to struggle with her after a few days. Dr. Timothy Lasso apparently saw her and made a hospice referral. Sharon [name] is now receiving [facility] therapy as well as nursing/aid. She had been having struggles with intake initially but her intake is improved.  She numerous falls in the month of May also.  Husband notes worsening and confusion and agitated behavior at night. He has difficulty sleeping as a result.  She also sleeps a lot during the day.  Husband adjusts doses according to her bipolar symptoms and mania.        Pain Inventory Average Pain 0 Pain Right Now 0 My pain is no pain  In the last 24 hours, has pain interfered with the following? General activity unable to assess Relation with others unable to assess Enjoyment of life unable to assess What TIME of day is your pain at its worst? no pain Sleep (in general) eratic  Pain is worse with: no pain Pain improves with: no pain Relief from Meds: no pain  Mobility walk with assistance how many minutes can you walk? 3 ability to climb steps?  no do you drive?  no use a wheelchair needs help with transfers  Function disabled: date disabled . I need assistance with the following:  feeding, dressing, bathing, toileting, meal prep, household duties and shopping  Neuro/Psych bladder control problems bowel control problems weakness trouble walking dizziness confusion  Prior Studies Any changes since last visit?  no  Physicians involved in your care Any changes since last visit?  no   Family History  Problem Relation Age of Onset  . Cancer Mother   . Heart attack Father    History   Social History  . Marital Status: Married    Spouse Name: N/A  . Number of Children: N/A  . Years of Education: N/A   Social History  Main Topics  . Smoking status: Never Smoker   . Smokeless tobacco: Never Used  . Alcohol Use: No  . Drug Use: No  . Sexual Activity: Not on file   Other Topics Concern  . None   Social History Narrative   Past Surgical History  Procedure Laterality Date  . Cholecystectomy     Past Medical History  Diagnosis Date  . Bipolar 1 disorder   . Dementia   . Other pancytopenia 06/03/2014  . Stroke    BP 111/63 mmHg  Pulse 74  Resp 14  SpO2 100%  Opioid Risk Score:   Fall Risk Score: High Fall Risk (>13 points) (pt previously educated)`1  Depression screen PHQ 2/9  Depression screen PHQ 2/9 01/11/2015  Decreased Interest 2  Down, Depressed, Hopeless 1  PHQ - 2 Score 3  Altered sleeping 1  Tired, decreased energy 1  Change in appetite 0  Moving slowly or fidgety/restless 1  PHQ-9 Score 6     Review of Systems  Gastrointestinal: Positive for diarrhea.  Genitourinary: Positive for urgency and frequency.       Bladder control problems  incontinence  Musculoskeletal: Positive for gait problem.  Neurological: Positive for dizziness and weakness.  Psychiatric/Behavioral: Positive for confusion.       Objective:   Physical Exam  No acute distress HENT:  Head: Normocephalic.  Eyes: EOM are normal. Pupils are equal, round, and reactive to light.    Neck: Normal range of motion. Neck supple. No tracheal deviation present. No thyromegaly present.  Cardiovascular: Normal rate and regular rhythm.  No murmur heard. Respiratory: Effort normal and breath sounds normal. No respiratory distress. She has no wheezes. She has no rales.  GI: Soft. Bowel sounds are normal. She exhibits no distension. There is no tenderness. There is no rebound.  Musculoskeletal: She exhibits low back tenderness. She exhibits no edema.  She moves all extremities. Walks without a device. Neurological: Coordination abnormal.  Patient is alert flat effect. Follows simple commands. Oriented  to name--not time,reason. Needed cues for [place]. Moves all 4's fairly equally with inconsistent effort. Favors right leg with weight bearing. Short, shuffling steps. Skin: Skin is warm and dry.  Psychiatric:  Remains confused but cooperative  Assessment/Plan: 1. Functional deficits secondary to TBI which require 3+ hours per day of interdisciplinary therapy in a comprehensive inpatient rehab setting. 2. Mood/bipolar disorder/dementia: Celexa 20 mg daily, Seroquel 200-300 mg twice a day  -lorazepam prn -establish sleep pattern  -work on fluid rationing at night. 3. Seizure disorder. Keppra 500 mg twice a day. Monitor for any seizure activity 4. Urinary frequency: see above  Follow up with me PRN.  PCP and hospice are following. I don't believe I am really a necessary component to the equation at this point.

## 2015-01-16 ENCOUNTER — Encounter (HOSPITAL_COMMUNITY): Payer: Self-pay | Admitting: Emergency Medicine

## 2015-01-16 ENCOUNTER — Emergency Department (HOSPITAL_COMMUNITY): Payer: Medicare Other

## 2015-01-16 ENCOUNTER — Inpatient Hospital Stay (HOSPITAL_COMMUNITY): Payer: Medicare Other

## 2015-01-16 ENCOUNTER — Inpatient Hospital Stay (HOSPITAL_COMMUNITY)
Admission: EM | Admit: 2015-01-16 | Discharge: 2015-01-19 | DRG: 083 | Disposition: A | Discharge status: Hospice | Payer: Medicare Other | Attending: Internal Medicine | Admitting: Internal Medicine

## 2015-01-16 DIAGNOSIS — Z515 Encounter for palliative care: Secondary | ICD-10-CM | POA: Diagnosis not present

## 2015-01-16 DIAGNOSIS — R296 Repeated falls: Secondary | ICD-10-CM | POA: Diagnosis present

## 2015-01-16 DIAGNOSIS — Z7982 Long term (current) use of aspirin: Secondary | ICD-10-CM | POA: Diagnosis not present

## 2015-01-16 DIAGNOSIS — Z79899 Other long term (current) drug therapy: Secondary | ICD-10-CM

## 2015-01-16 DIAGNOSIS — S42214A Unspecified nondisplaced fracture of surgical neck of right humerus, initial encounter for closed fracture: Secondary | ICD-10-CM | POA: Diagnosis present

## 2015-01-16 DIAGNOSIS — F0391 Unspecified dementia with behavioral disturbance: Secondary | ICD-10-CM | POA: Diagnosis not present

## 2015-01-16 DIAGNOSIS — R350 Frequency of micturition: Secondary | ICD-10-CM | POA: Diagnosis present

## 2015-01-16 DIAGNOSIS — S065X9A Traumatic subdural hemorrhage with loss of consciousness of unspecified duration, initial encounter: Secondary | ICD-10-CM | POA: Diagnosis present

## 2015-01-16 DIAGNOSIS — F3160 Bipolar disorder, current episode mixed, unspecified: Secondary | ICD-10-CM | POA: Diagnosis present

## 2015-01-16 DIAGNOSIS — Z87820 Personal history of traumatic brain injury: Secondary | ICD-10-CM

## 2015-01-16 DIAGNOSIS — F03918 Unspecified dementia, unspecified severity, with other behavioral disturbance: Secondary | ICD-10-CM | POA: Diagnosis present

## 2015-01-16 DIAGNOSIS — D619 Aplastic anemia, unspecified: Secondary | ICD-10-CM | POA: Diagnosis not present

## 2015-01-16 DIAGNOSIS — Z66 Do not resuscitate: Secondary | ICD-10-CM | POA: Diagnosis present

## 2015-01-16 DIAGNOSIS — D61818 Other pancytopenia: Secondary | ICD-10-CM | POA: Diagnosis present

## 2015-01-16 DIAGNOSIS — R1314 Dysphagia, pharyngoesophageal phase: Secondary | ICD-10-CM | POA: Diagnosis present

## 2015-01-16 DIAGNOSIS — G40909 Epilepsy, unspecified, not intractable, without status epilepticus: Secondary | ICD-10-CM | POA: Diagnosis not present

## 2015-01-16 DIAGNOSIS — W19XXXA Unspecified fall, initial encounter: Secondary | ICD-10-CM

## 2015-01-16 DIAGNOSIS — S42201A Unspecified fracture of upper end of right humerus, initial encounter for closed fracture: Secondary | ICD-10-CM

## 2015-01-16 DIAGNOSIS — M25511 Pain in right shoulder: Secondary | ICD-10-CM

## 2015-01-16 DIAGNOSIS — I951 Orthostatic hypotension: Secondary | ICD-10-CM | POA: Diagnosis present

## 2015-01-16 DIAGNOSIS — R4182 Altered mental status, unspecified: Secondary | ICD-10-CM

## 2015-01-16 DIAGNOSIS — R569 Unspecified convulsions: Secondary | ICD-10-CM

## 2015-01-16 DIAGNOSIS — S065XAA Traumatic subdural hemorrhage with loss of consciousness status unknown, initial encounter: Secondary | ICD-10-CM | POA: Diagnosis present

## 2015-01-16 DIAGNOSIS — I6201 Nontraumatic acute subdural hemorrhage: Secondary | ICD-10-CM | POA: Diagnosis not present

## 2015-01-16 LAB — CBC
HCT: 31.4 % — ABNORMAL LOW (ref 36.0–46.0)
Hemoglobin: 10.7 g/dL — ABNORMAL LOW (ref 12.0–15.0)
MCH: 29.9 pg (ref 26.0–34.0)
MCHC: 34.1 g/dL (ref 30.0–36.0)
MCV: 87.7 fL (ref 78.0–100.0)
Platelets: 105 10*3/uL — ABNORMAL LOW (ref 150–400)
RBC: 3.58 MIL/uL — ABNORMAL LOW (ref 3.87–5.11)
RDW: 13.2 % (ref 11.5–15.5)
WBC: 3.1 10*3/uL — ABNORMAL LOW (ref 4.0–10.5)

## 2015-01-16 LAB — COMPREHENSIVE METABOLIC PANEL
ALK PHOS: 104 U/L (ref 38–126)
ALT: 32 U/L (ref 14–54)
AST: 22 U/L (ref 15–41)
Albumin: 3.8 g/dL (ref 3.5–5.0)
Anion gap: 6 (ref 5–15)
BUN: 12 mg/dL (ref 6–20)
CHLORIDE: 109 mmol/L (ref 101–111)
CO2: 26 mmol/L (ref 22–32)
CREATININE: 0.6 mg/dL (ref 0.44–1.00)
Calcium: 9 mg/dL (ref 8.9–10.3)
GFR calc Af Amer: >60 mL/min (ref >60)
GFR calc non Af Amer: >60 mL/min (ref >60)
GLUCOSE: 140 mg/dL — AB (ref 65–99)
Potassium: 4 mmol/L (ref 3.5–5.1)
Sodium: 141 mmol/L (ref 135–145)
TOTAL PROTEIN: 6.1 g/dL — AB (ref 6.5–8.1)
Total Bilirubin: 0.8 mg/dL (ref 0.3–1.2)

## 2015-01-16 LAB — URINALYSIS, ROUTINE W REFLEX MICROSCOPIC
Bilirubin Urine: NEGATIVE
GLUCOSE, UA: NEGATIVE mg/dL
Hgb urine dipstick: NEGATIVE
KETONES UR: NEGATIVE mg/dL
NITRITE: NEGATIVE
Protein, ur: NEGATIVE mg/dL
Specific Gravity, Urine: 1.012 (ref 1.005–1.030)
Urobilinogen, UA: 1 mg/dL (ref 0.0–1.0)
pH: 6.5 (ref 5.0–8.0)

## 2015-01-16 LAB — URINE MICROSCOPIC-ADD ON

## 2015-01-16 LAB — MRSA PCR SCREENING: MRSA by PCR: NEGATIVE

## 2015-01-16 LAB — VALPROIC ACID LEVEL: Valproic Acid Lvl: <10 ug/mL — ABNORMAL LOW (ref 50.0–100.0)

## 2015-01-16 MED ORDER — SODIUM CHLORIDE 0.9 % IJ SOLN
3.0000 mL | Freq: Two times a day (BID) | INTRAMUSCULAR | Status: DC
  Administered 2015-01-17 – 2015-01-19 (×5): 3 mL via INTRAVENOUS

## 2015-01-16 MED ORDER — QUETIAPINE FUMARATE 200 MG PO TABS
200.0000 mg | ORAL_TABLET | Freq: Two times a day (BID) | ORAL | Status: DC
  Administered 2015-01-16 – 2015-01-18 (×4): 200 mg via ORAL
  Filled 2015-01-16 (×5): qty 1

## 2015-01-16 MED ORDER — DARIFENACIN HYDROBROMIDE ER 7.5 MG PO TB24
7.5000 mg | ORAL_TABLET | Freq: Every day | ORAL | Status: DC
  Administered 2015-01-17 – 2015-01-19 (×3): 7.5 mg via ORAL
  Filled 2015-01-16 (×3): qty 1

## 2015-01-16 MED ORDER — ONDANSETRON HCL 4 MG/2ML IJ SOLN: 4.0000 mg | Freq: Four times a day (QID) | INTRAMUSCULAR | Status: DC | PRN

## 2015-01-16 MED ORDER — CETYLPYRIDINIUM CHLORIDE 0.05 % MT LIQD
7.0000 mL | Freq: Two times a day (BID) | OROMUCOSAL | Status: DC
  Administered 2015-01-17 – 2015-01-19 (×2): 7 mL via OROMUCOSAL

## 2015-01-16 MED ORDER — ACETAMINOPHEN 325 MG PO TABS
650.0000 mg | ORAL_TABLET | Freq: Four times a day (QID) | ORAL | Status: DC | PRN
  Administered 2015-01-17 – 2015-01-19 (×3): 650 mg via ORAL
  Filled 2015-01-16 (×3): qty 2

## 2015-01-16 MED ORDER — CHLORHEXIDINE GLUCONATE 0.12 % MT SOLN
15.0000 mL | Freq: Two times a day (BID) | OROMUCOSAL | Status: DC
  Administered 2015-01-16 – 2015-01-19 (×6): 15 mL via OROMUCOSAL
  Filled 2015-01-16 (×7): qty 15

## 2015-01-16 MED ORDER — LEVETIRACETAM 500 MG/5ML IV SOLN
500.0000 mg | Freq: Two times a day (BID) | INTRAVENOUS | Status: DC
Start: 2015-01-16 — End: 2015-01-18
  Administered 2015-01-16 – 2015-01-17 (×3): 500 mg via INTRAVENOUS
  Filled 2015-01-16 (×6): qty 5

## 2015-01-16 MED ORDER — ACETAMINOPHEN 650 MG RE SUPP: 650.0000 mg | Freq: Four times a day (QID) | RECTAL | Status: DC | PRN

## 2015-01-16 MED ORDER — SODIUM CHLORIDE 0.9 % IV BOLUS (SEPSIS)
1000.0000 mL | Freq: Once | INTRAVENOUS | Status: AC
  Administered 2015-01-16: 1000 mL via INTRAVENOUS

## 2015-01-16 MED ORDER — ONDANSETRON HCL 4 MG PO TABS: 4.0000 mg | ORAL_TABLET | Freq: Four times a day (QID) | ORAL | Status: DC | PRN

## 2015-01-16 MED ORDER — THIAMINE HCL 100 MG/ML IJ SOLN
100.0000 mg | Freq: Every day | INTRAMUSCULAR | Status: DC
Start: 2015-01-16 — End: 2015-01-17
  Administered 2015-01-16 – 2015-01-17 (×2): 100 mg via INTRAVENOUS
  Filled 2015-01-16 (×2): qty 1

## 2015-01-16 MED ORDER — LORAZEPAM 0.5 MG PO TABS
0.5000 mg | ORAL_TABLET | Freq: Two times a day (BID) | ORAL | Status: DC
  Administered 2015-01-16 – 2015-01-19 (×6): 0.5 mg via ORAL
  Filled 2015-01-16 (×6): qty 1

## 2015-01-16 MED ORDER — TAMSULOSIN HCL 0.4 MG PO CAPS
0.4000 mg | ORAL_CAPSULE | Freq: Every day | ORAL | Status: DC
  Administered 2015-01-16 – 2015-01-18 (×3): 0.4 mg via ORAL
  Filled 2015-01-16 (×4): qty 1

## 2015-01-16 MED ORDER — FOLIC ACID 5 MG/ML IJ SOLN
1.0000 mg | Freq: Every day | INTRAMUSCULAR | Status: DC
Start: 2015-01-16 — End: 2015-01-17
  Administered 2015-01-16 – 2015-01-17 (×2): 1 mg via INTRAVENOUS
  Filled 2015-01-16 (×2): qty 0.2

## 2015-01-16 MED ORDER — CITALOPRAM HYDROBROMIDE 10 MG PO TABS
20.0000 mg | ORAL_TABLET | Freq: Every day | ORAL | Status: DC
  Administered 2015-01-16 – 2015-01-18 (×3): 20 mg via ORAL
  Filled 2015-01-16 (×2): qty 2
  Filled 2015-01-16 (×2): qty 1

## 2015-01-16 NOTE — ED Notes (Signed)
Pt in xray at current time.  

## 2015-01-16 NOTE — ED Notes (Signed)
Pt back from x-ray.

## 2015-01-16 NOTE — H&P (Signed)
[facility] History and Physical                                                                                    Sharon [name], is a 69 y.o. female  MRN: [id number]   DOB - [birth year]  Admit Date - 01/16/2015  Outpatient Primary MD for the patient is Gwen Pounds, MD  Referring MD: Denton Lank / ER  Consulting MD: Conchita Paris / Neurosurgery  With History of -  Past Medical History  Diagnosis Date  . Bipolar 1 disorder   . Dementia   . Other pancytopenia 06/03/2014  . Stroke       Past Surgical History  Procedure Laterality Date  . Cholecystectomy      in for   Chief Complaint  Patient presents with  . 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 This resulted in intracerebral hemorrhage. After admission patient decompensated from a respiratory standpoint so was transferred to the ICU but did not require intubation. Subsequent she recovered and was discharged to [facility] and then later to a nursing home and was eventually discharged home into the care of her husband. According to her husband she is on hospice care at home but is NOT a full DO NOT RESUSCITATE. At baseline the patient is able to ambulate and has marked disequilibrium and continues to have issues with falling well his confusion which also is contributing to her falls. Husband suspects the patient has been having some recurrent seizures at home in the past 48 hours. She had a witnessed fall on 6/25. Since the fall the husband witnessed the patient having a seizure while she was sitting on the toilet; she became unresponsive so he positioned her onto the floor. EMS was called and by the time they arrived the patient was alert and oriented baseline mental status. Since the fall patient has been unable to feed herself and has had decreased level of consciousness per her. She has known dysphagia from prior brain injury that recently has been able  to tolerate thin liquids again. When the patient fell she also fell on her right arm and shoulder and is now complaining of pain and has bruising in the same area.  Upon arrival to the ER the patient was afebrile and hemodynamically stable with O2 saturations of 96% on room air. CT of the head without contrast was completed in the ER and this revealed bilateral subdural hematomas left greater than right associated with minimal left to right midline shift as well as encephalomalacia in the left frontal region and right cerebellum which is sequelae of the previous hemorrhagic contusions. Laboratory data was unremarkable except for mildly elevated glucose 140. Patient has a history of pancytopenia which has been mild and current white count is 3100 with a platelet count of 105,000.   Review of Systems   In addition to the HPI above,  No Fever-chills, myalgias or other constitutional symptoms No Headache, changes with Vision or hearing, new weakness, tingling, numbness in any extremity, witnessed seizure activity in the past 24 hours and presumed  seizure activity prior to fall yesterday No problems swallowing food or Liquids, indigestion/reflux No Chest pain, Cough or Shortness of Breath, palpitations, orthopnea or DOE No Abdominal pain, N/V; no melena or hematochezia, no dark tarry stools, Bowel movements are regular, No dysuria, hematuria or flank pain and patient questionably endorsing suprapubic discomfort No new skin rashes, lesions, masses or bruises, No new joints pains-aches No recent weight gain or loss No polyuria, polydypsia or polyphagia,  *A full 10 point Review of Systems was done, except as stated above, all other Review of Systems were negative.  Social History History  Substance Use Topics  . Smoking status: Never Smoker   . Smokeless tobacco: Never Used  . Alcohol Use: No    Resides at: Private residence  Lives with: Husband  Ambulatory status: Ambulatory but with marked  disequilibrium exacerbated by confusion and recurrent falls   Family History Family History  Problem Relation Age of Onset  . Cancer Mother   . Heart attack Father      Prior to Admission medications   Medication Sig Start Date End Date Taking? Authorizing Provider  acetaminophen (TYLENOL) 500 MG tablet Take 500 mg by mouth at bedtime as needed for moderate pain.   Yes Historical Provider, MD  aspirin EC 81 MG tablet Take 81 mg by mouth at bedtime.   Yes Historical Provider, MD  Cholecalciferol (VITAMIN D3) 1000 UNITS CAPS Take 1 capsule by mouth daily.    Yes Historical Provider, MD  citalopram (CELEXA) 40 MG tablet Take 20 mg by mouth at bedtime.  05/04/14  Yes Historical Provider, MD  ferrous sulfate 325 (65 FE) MG tablet Take 325 mg by mouth 2 (two) times daily with a meal.   Yes Historical Provider, MD  levETIRAcetam (KEPPRA) 500 MG tablet Take 1 tablet (500 mg total) by mouth 2 (two) times daily. 10/25/14  Yes Kathlen Mody, MD  LORazepam (ATIVAN) 0.5 MG tablet Take 1 tablet by mouth 2 (two) times daily as needed. 01/11/15  Yes Historical Provider, MD  QUEtiapine (SEROQUEL) 300 MG tablet Take 0.5 tablets (150 mg total) by mouth 2 (two) times daily. 10/25/14  Yes Kathlen Mody, MD  tamsulosin (FLOMAX) 0.4 MG CAPS capsule Take 0.4 mg by mouth at bedtime.  03/12/14  Yes Historical Provider, MD  trospium (SANCTURA) 20 MG tablet Take 20 mg by mouth 2 (two) times daily.  05/17/14  Yes Historical Provider, MD  vitamin B-12 (CYANOCOBALAMIN) 500 MCG tablet Take 1,000 mcg by mouth at bedtime.   Yes Historical Provider, MD  zinc sulfate 220 MG capsule Take 220 mg by mouth daily.   Yes Historical Provider, MD  furosemide (LASIX) 20 MG tablet Take 1 tablet (20 mg total) by mouth daily. Patient not taking: Reported on 01/16/2015 10/25/14   Kathlen Mody, MD  lisinopril (PRINIVIL,ZESTRIL) 2.5 MG tablet Take 1 tablet (2.5 mg total) by mouth daily. Patient not taking: Reported on 01/16/2015 10/25/14   Kathlen Mody, MD    No Known Allergies  Physical Exam  Vitals  Blood pressure 155/86, pulse 81, temperature 98.2 F (36.8 C), temperature source Oral, resp. rate 15, SpO2 95 %.   General:  In no acute distress, appears chronically ill  Psych: Alert but confused, follows commands with some prompting-baseline dementia so exam limited by this chronic issue  Neuro:   No apparent new focal neurological deficits, CN II through XII intact, Strength 4/5 all 4 extremities although mobility limited right arm secondary to acute shoulder pain from fall, Sensation  intact all 4 extremities.  ENT:  Ears and Eyes appear Normal, Conjunctivae clear, PER. Moist oral mucosa without erythema or exudates.  Neck:  Supple, No lymphadenopathy appreciated  Respiratory:  Symmetrical chest wall movement, Good air movement bilaterally, CTAB. Room Air  Cardiac:  RRR, No Murmurs, no LE edema noted, no JVD, No carotid bruits, peripheral pulses palpable at 2+  Abdomen:  Positive bowel sounds, Soft, Non tender, Non distended,  No masses appreciated, no obvious hepatosplenomegaly-?? 3. Discomfort with palpation  Skin:  No Cyanosis, Normal Skin Turgor, No Skin Rash -bruising right shoulder  Extremities: Symmetrical without obvious trauma or injury,  no effusions.  Data Review  CBC  Recent Labs Lab 01/16/15 1517  WBC 3.1*  HGB 10.7*  HCT 31.4*  PLT 105*  MCV 87.7  MCH 29.9  MCHC 34.1  RDW 13.2    Chemistries   Recent Labs Lab 01/16/15 1517  NA 141  K 4.0  CL 109  CO2 26  GLUCOSE 140*  BUN 12  CREATININE 0.60  CALCIUM 9.0  AST 22  ALT 32  ALKPHOS 104  BILITOT 0.8    CrCl cannot be calculated (Unknown ideal weight.).  No results for input(s): TSH, T4TOTAL, T3FREE, THYROIDAB in the last 72 hours.  Invalid input(s): FREET3  Coagulation profile No results for input(s): INR, PROTIME in the last 168 hours.  No results for input(s): DDIMER in the last 72 hours.  Cardiac Enzymes No  results for input(s): CKMB, TROPONINI, MYOGLOBIN in the last 168 hours.  Invalid input(s): CK  Invalid input(s): POCBNP  Urinalysis    Component Value Date/Time   COLORURINE YELLOW 10/29/2014 1207   APPEARANCEUR CLEAR 10/29/2014 1207   LABSPEC 1.016 10/29/2014 1207   PHURINE 7.0 10/29/2014 1207   GLUCOSEU NEGATIVE 10/29/2014 1207   HGBUR NEGATIVE 10/29/2014 1207   BILIRUBINUR NEGATIVE 10/29/2014 1207   KETONESUR NEGATIVE 10/29/2014 1207   PROTEINUR NEGATIVE 10/29/2014 1207   UROBILINOGEN 1.0 10/29/2014 1207   NITRITE NEGATIVE 10/29/2014 1207   LEUKOCYTESUR NEGATIVE 10/29/2014 1207    Imaging results:   Ct Head Wo Contrast  01/16/2015   CLINICAL DATA:  Took a fall 3 months ago. Suffered TBI. Patient has had seizure activity since the event. Event today while on the toilet began leaning and posturing. Fall yesterday to the right side.  EXAM: CT HEAD WITHOUT CONTRAST  TECHNIQUE: Contiguous axial images were obtained from the base of the skull through the vertex without intravenous contrast.  COMPARISON:  CT head 10/15/2014  FINDINGS: There is a left frontotemporal subdural hematoma measuring 1.5 cm maximum diameter. A smaller right frontotemporal subdural hematoma measures 4.3 mm. There is 3 mm of left-to-right midline shift.  Encephalomalacia is identified in the left frontal region and right cerebellum.  No significant change in right skullbase fracture as identified on studies in March. There is atherosclerotic calcification of the internal carotid arteries.  IMPRESSION: 1. New bilateral subdural hematomas, left greater than right, associated with minimal left-to-right midline shift. 2. No new calvarial fracture. 3. 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 4. Critical Value/emergent results were called by telephone at the time of interpretation on 01/16/2015 at 3:59 pm to Dr. Cathren Laine , who verbally acknowledged these  results.   Electronically Signed   By: Norva Pavlov M.D.   On: 01/16/2015 16:00     EKG: (Independently reviewed) sinus rhythm, no acute ischemic ST or T-wave changes, normal QTC   Assessment &  Plan  Principal Problem:   Acute bilateral subdural hematomas/traumatic after fall -Admit to stepdown -Appreciate neurosurgery input to EDP and await formal consultation -Repeat CT head on Tuesday 6/28-sooner if develops worsening of symptoms -ET/OT evaluation  Active Problems:   Seizure disorder -Question patient may have had a seizure prior to fall but definite witnessed seizure activity after fall; suspect increased ICP from evolving subdurals contributed to seizure activity after fall -As precaution check Keppra level -Continue preadmission Keppra but IV route    History of traumatic brain injury/ICH -Patient was on a form of hospice care at home but remained a full code -Husband now expressing concerns over her inability to manage patient at home and would like patient placed if possible during this hospitalization -Social work is been consulted    Dementia with behavioral disturbance -As above    Bipolar disorder, mixed -Last admission when patient did not have feeding tube and was nothing by mouth she missed several days of her psychiatric medications and had sequelae from that therefore despite being nothing by mouth we will allow for oral medications with applesauce or pudding    Dysphagia, pharyngoesophageal phase -Formal speech therapy evaluation requested    Urinary frequency -Continue preadmission [place] noting automatic therapeutic substitution -Continue Flomax as well    Acquired pancytopenia -History of same during previous admission although had corrected by time of discharge -Unclear if contributed to development of subdural hematomas -Unclear if mediated by medications such as psych meds or AEDs-intended to follow labs    DVT Prophylaxis: SCDs  Family  Communication:   Husband and children at bedside  Code Status:  Full code-this was discussed at length with family  Condition: Guarded   Discharge disposition: Per husband's request anticipate discharge to skilled nursing facility when medically stable  Time spent in minutes : 60      ELLIS,ALLISON L. ANP on 01/16/2015 at 5:18 PM  Between 7am to 7pm - Pager - [phone]  After 7pm go to [email or web address] - password [email or web address]  And look for the night coverage person covering me after hours  [facility]  I have taken an interval history, reviewed the chart and examined the patient.  69 year old female with history of traumatic brain injury, intra-cerebral hemorrhage who was under hospice care coming with recurrent seizures. Patient started on home medications for seizure including Depakote and Keppra. Keppra level has been drawn and is pending. Dose of Keppra can be increased, based on the levels. CT of the head in the ER revealed bilateral subdural hematomas left more than right with minimal left-to-right midline shift. Neurosurgeon Dr. Hoy Register -Lucianne Muss was consulted by the ED physician. Who recommended to repeat head CT on 01/18/2015.  I agree with the Advanced Practice Provider's note, impression and recommendations. I have made any necessary editorial changes.

## 2015-01-16 NOTE — Consult Note (Signed)
I have reviewed her xrays and discussed her case with Dr, Denton Lank. Tentative plan is for non-operative treatment with sling for 6wks.   I will perform formal c/s within 24hrs.    Margarita Rana D Cell: [phone]

## 2015-01-16 NOTE — ED Provider Notes (Addendum)
CSN: [id number]     Arrival date & time 01/16/15  1409 History   First MD Initiated Contact with Patient 01/16/15 1416     Chief Complaint  Patient presents with  . Seizures     (Consider location/radiation/quality/duration/timing/severity/associated sxs/prior Treatment) Patient is a 69 y.o. female presenting with seizures. The history is provided by the patient and the EMS personnel. The history is limited by the condition of the patient.  Seizures Patient w hx TBI, dementia, sz, presents via EMS from home after suspected seizure.  Husband had noticed pt stiffened, and leaned to left while on toilet, became unresponsive, he lied her to floor. By EMS arrival pt alert, oriented to self.  Pt very limited hx, re tbi/dementia - level 5 caveat. Per report, pt did have a fall yesterday, ?head injury. Pt unsure when last seizure was.  Indicates does take her meds. Pt unaware of recent change in meds. No fevers. Denies pain. Denies gu c/o.       Past Medical History  Diagnosis Date  . Bipolar 1 disorder   . Dementia   . Other pancytopenia 06/03/2014  . Stroke    Past Surgical History  Procedure Laterality Date  . Cholecystectomy     Family History  Problem Relation Age of Onset  . Cancer Mother   . Heart attack Father    History  Substance Use Topics  . Smoking status: Never Smoker   . Smokeless tobacco: Never Used  . Alcohol Use: No   OB History    No data available       Review of Systems  Unable to perform ROS: Dementia  Constitutional: Negative for fever.  Neurological: Positive for seizures.  hx tbi/dementia - level 5 caveat.     Allergies  Review of patient's allergies indicates no known allergies.  Home Medications   Prior to Admission medications   Medication Sig Start Date End Date Taking? Authorizing Provider  acetaminophen (TYLENOL) 500 MG tablet Take 500 mg by mouth at bedtime as needed for moderate pain.    Historical Provider, MD  aspirin EC 81 MG  tablet Take 81 mg by mouth at bedtime.    Historical Provider, MD  Cholecalciferol (VITAMIN D3) 1000 UNITS CAPS Take 1 capsule by mouth at bedtime as needed.     Historical Provider, MD  citalopram (CELEXA) 40 MG tablet Take 20 mg by mouth at bedtime.  05/04/14   Historical Provider, MD  Fish Oil-Cholecalciferol (FISH OIL + D3) 1200-1000 MG-UNIT CAPS Take 1,200 mg by mouth at bedtime.     Historical Provider, MD  FLUZONE HIGH-DOSE 0.5 ML SUSY Inject 1 each into the skin once.  04/08/14   Historical Provider, MD  furosemide (LASIX) 20 MG tablet Take 1 tablet (20 mg total) by mouth daily. 10/25/14   Kathlen Mody, MD  levETIRAcetam (KEPPRA) 500 MG tablet Take 1 tablet (500 mg total) by mouth 2 (two) times daily. 10/25/14   Kathlen Mody, MD  lisinopril (PRINIVIL,ZESTRIL) 2.5 MG tablet Take 1 tablet (2.5 mg total) by mouth daily. 10/25/14   Kathlen Mody, MD  QUEtiapine (SEROQUEL) 300 MG tablet Take 0.5 tablets (150 mg total) by mouth 2 (two) times daily. 10/25/14   Kathlen Mody, MD  tamsulosin (FLOMAX) 0.4 MG CAPS capsule Take 0.4 mg by mouth at bedtime.  03/12/14   Historical Provider, MD  trospium (SANCTURA) 20 MG tablet Take 20 mg by mouth 2 (two) times daily.  05/17/14   Historical Provider, MD  vitamin B-12 (  CYANOCOBALAMIN) 500 MCG tablet Take 1,000 mcg by mouth at bedtime.    Historical Provider, MD  zinc sulfate 220 MG capsule Take 220 mg by mouth daily.    Historical Provider, MD   BP 150/69 mmHg  Pulse 76  Temp(Src) 98.2 F (36.8 C) (Oral)  Resp 14  SpO2 100% Physical Exam  Constitutional: She appears well-developed and well-nourished. No distress.  HENT:  Head: Atraumatic.  Dry mouth/mm.   Eyes: Conjunctivae are normal. Pupils are equal, round, and reactive to light. No scleral icterus.  Neck: Normal range of motion. Neck supple. No tracheal deviation present. No thyromegaly present.  No stiffness or rigidity. No bruit.   Cardiovascular: Normal rate, normal heart sounds and intact distal  pulses.  Exam reveals no gallop and no friction rub.   No murmur heard. Pulmonary/Chest: Effort normal and breath sounds normal. No respiratory distress.  Abdominal: Soft. Normal appearance and bowel sounds are normal. She exhibits no distension. There is no tenderness.  Genitourinary:  No cva tenderness.   Musculoskeletal: She exhibits no edema or tenderness.  Pain/swelling to right shoulder, otherwise good rom bil ext without other pain or focal bony tenderness.  CTLS spine, non tender, aligned, no step off.   Neurological: She is alert.  Oriented to person. Moves bil ext purposefully w good strength. No seizure activity noted.   Skin: Skin is warm and dry. No rash noted. She is not diaphoretic.  Psychiatric: She has a normal mood and affect.  Nursing note and vitals reviewed.   ED Course  Procedures (including critical care time) Labs Review  Results for orders placed or performed during the hospital encounter of 01/16/15  CBC  Result Value Ref Range   WBC 3.1 (L) 4.0 - 10.5 K/uL   RBC 3.58 (L) 3.87 - 5.11 MIL/uL   Hemoglobin 10.7 (L) 12.0 - 15.0 g/dL   HCT 16.1 (L) 09.6 - 04.5 %   MCV 87.7 78.0 - 100.0 fL   MCH 29.9 26.0 - 34.0 pg   MCHC 34.1 30.0 - 36.0 g/dL   RDW 40.9 81.1 - 91.4 %   Platelets 105 (L) 150 - 400 K/uL  Comprehensive metabolic panel  Result Value Ref Range   Sodium 141 135 - 145 mmol/L   Potassium 4.0 3.5 - 5.1 mmol/L   Chloride 109 101 - 111 mmol/L   CO2 26 22 - 32 mmol/L   Glucose, Bld 140 (H) 65 - 99 mg/dL   BUN 12 6 - 20 mg/dL   Creatinine, Ser 7.82 0.44 - 1.00 mg/dL   Calcium 9.0 8.9 - 95.6 mg/dL   Total Protein 6.1 (L) 6.5 - 8.1 g/dL   Albumin 3.8 3.5 - 5.0 g/dL   AST 22 15 - 41 U/L   ALT 32 14 - 54 U/L   Alkaline Phosphatase 104 38 - 126 U/L   Total Bilirubin 0.8 0.3 - 1.2 mg/dL   GFR calc non Af Amer >60 >60 mL/min   GFR calc Af Amer >60 >60 mL/min   Anion gap 6 5 - 15  Valproic acid level  Result Value Ref Range   Valproic Acid Lvl <10  (L) 50.0 - 100.0 ug/mL   Ct Head Wo Contrast  01/16/2015   CLINICAL DATA:  Took a fall 3 months ago. Suffered TBI. Patient has had seizure activity since the event. Event today while on the toilet began leaning and posturing. Fall yesterday to the right side.  EXAM: CT HEAD WITHOUT CONTRAST  TECHNIQUE: Contiguous  axial images were obtained from the base of the skull through the vertex without intravenous contrast.  COMPARISON:  CT head 10/15/2014  FINDINGS: There is a left frontotemporal subdural hematoma measuring 1.5 cm maximum diameter. A smaller right frontotemporal subdural hematoma measures 4.3 mm. There is 3 mm of left-to-right midline shift.  Encephalomalacia is identified in the left frontal region and right cerebellum.  No significant change in right skullbase fracture as identified on studies in March. There is atherosclerotic calcification of the internal carotid arteries.  IMPRESSION: 1. New bilateral subdural hematomas, left greater than right, associated with minimal left-to-right midline shift. 2. No new calvarial fracture. 3. 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 4. Critical Value/emergent results were called by telephone at the time of interpretation on 01/16/2015 at 3:59 pm to Dr. Cathren Laine , who verbally acknowledged these results.   Electronically Signed   By: Norva Pavlov M.D.   On: 01/16/2015 16:00        EKG Interpretation   Date/Time:  Sunday January 16 2015 15:17:10 EDT Ventricular Rate:  79 PR Interval:  169 QRS Duration: 75 QT Interval:  382 QTC Calculation: 438 R Axis:   87 Text Interpretation:  Sinus rhythm Borderline right axis deviation  Nonspecific T wave abnormality Confirmed by Denton Lank  MD, Caryn Bee ([id number]) on  01/16/2015 3:39:58 PM      MDM   Iv ns bolus. Labs. Ua.  Reviewed nursing notes and prior charts for additional history.   Ct scan shows bilateral subdural hematomas, new as  compared to prior CT w small amt midline shift.  Neurosurgery immediately consulted - discussed pt with Dr Conchita Paris who has reviewed CT, and states no need for acute surgical intervention - he recommends admission to medical service, they will consult.   Recheck pt, reassess neuro exam - no acute change as compared to initial exam.  Right shoulder painful, w mild swelling - recent fall - will xray.  Frequent neurochecks.   Filed Vitals:   01/16/15 1430  BP: 150/69  Pulse: 76  Temp:   Resp: 14    [facility] paged for admission.  CRITICAL CARE  RE acute bilateral subdural hematomas, altered mental status, seizure.  Performed by: Suzi Roots Total critical care time: 35 Critical care time was exclusive of separately billable procedures and treating other patients. Critical care was necessary to treat or prevent imminent or life-threatening deterioration. Critical care was time spent personally by me on the following activities: development of treatment plan with patient and/or surrogate as well as nursing, discussions with consultants, evaluation of patient's response to treatment, examination of patient, obtaining history from patient or surrogate, ordering and performing treatments and interventions, ordering and review of laboratory studies, ordering and review of radiographic studies, pulse oximetry and re-evaluation of patient's condition.  Right shoulder xrays still pending - will check when done.   Pt with prox humerus fx, sling applied, orthopedics consulted.  Dr Eulah Pont will see in hospital tomorrow.         Cathren Laine, MD 01/16/15 [phone]

## 2015-01-16 NOTE — Progress Notes (Signed)
Orthopedic Tech Progress Note Patient Details:  Sharon [name] Sep 17, 1945 [id number]  Ortho Devices Type of Ortho Device: Shoulder immobilizer Ortho Device/Splint Location: RUE Ortho Device/Splint Interventions: Ordered, Application   Jennye Moccasin 01/16/2015, 7:26 PM

## 2015-01-16 NOTE — Consult Note (Signed)
ORTHOPAEDIC CONSULTATION  REQUESTING PHYSICIAN: Oswald Hillock, MD  Chief Complaint: right shoulder injury   HPI: Sharon [name] is a 69 y.o. female who is demented at baseline. She suffered a witnessed fall on 6/25 and has c/o pain in her R arm. She is being admitted for a medical workup. She lives at home with her husband  Past Medical History  Diagnosis Date  . Bipolar 1 disorder   . Dementia   . Other pancytopenia 06/03/2014  . Stroke    Past Surgical History  Procedure Laterality Date  . Cholecystectomy     History   Social History  . Marital Status: Married    Spouse Name: N/A  . Number of Children: N/A  . Years of Education: N/A   Social History Main Topics  . Smoking status: Never Smoker   . Smokeless tobacco: Never Used  . Alcohol Use: No  . Drug Use: No  . Sexual Activity: Not on file   Other Topics Concern  . None   Social History Narrative   Family History  Problem Relation Age of Onset  . Cancer Mother   . Heart attack Father    No Known Allergies Prior to Admission medications   Medication Sig Start Date End Date Taking? Authorizing Provider  acetaminophen (TYLENOL) 500 MG tablet Take 500 mg by mouth at bedtime as needed for moderate pain.   Yes Historical Provider, MD  aspirin EC 81 MG tablet Take 81 mg by mouth at bedtime.   Yes Historical Provider, MD  Cholecalciferol (VITAMIN D3) 1000 UNITS CAPS Take 1 capsule by mouth daily.    Yes Historical Provider, MD  citalopram (CELEXA) 40 MG tablet Take 20 mg by mouth at bedtime.  05/04/14  Yes Historical Provider, MD  ferrous sulfate 325 (65 FE) MG tablet Take 325 mg by mouth 2 (two) times daily with a meal.   Yes Historical Provider, MD  levETIRAcetam (KEPPRA) 500 MG tablet Take 1 tablet (500 mg total) by mouth 2 (two) times daily. 10/25/14  Yes Hosie Poisson, MD  LORazepam (ATIVAN) 0.5 MG tablet Take 1 tablet by mouth 2 (two) times daily as needed. 01/11/15  Yes Historical Provider, MD  QUEtiapine  (SEROQUEL) 300 MG tablet Take 0.5 tablets (150 mg total) by mouth 2 (two) times daily. 10/25/14  Yes Hosie Poisson, MD  tamsulosin (FLOMAX) 0.4 MG CAPS capsule Take 0.4 mg by mouth at bedtime.  03/12/14  Yes Historical Provider, MD  trospium (SANCTURA) 20 MG tablet Take 20 mg by mouth 2 (two) times daily.  05/17/14  Yes Historical Provider, MD  vitamin B-12 (CYANOCOBALAMIN) 500 MCG tablet Take 1,000 mcg by mouth at bedtime.   Yes Historical Provider, MD  zinc sulfate 220 MG capsule Take 220 mg by mouth daily.   Yes Historical Provider, MD  furosemide (LASIX) 20 MG tablet Take 1 tablet (20 mg total) by mouth daily. Patient not taking: Reported on 01/16/2015 10/25/14   Hosie Poisson, MD  lisinopril (PRINIVIL,ZESTRIL) 2.5 MG tablet Take 1 tablet (2.5 mg total) by mouth daily. Patient not taking: Reported on 01/16/2015 10/25/14   Hosie Poisson, MD   Dg Shoulder Right  01/16/2015   CLINICAL DATA:  Trauma right shoulder pain hematoma on right arm  EXAM: RIGHT SHOULDER - 2+ VIEW  COMPARISON:  None.  FINDINGS: Nondisplaced fracture through the surgical neck of the humerus. Humerus appears appropriately located relative to the glenoid.  IMPRESSION: Acute fracture surgical neck of the humerus  Electronically Signed   By: Skipper Cliche M.D.   On: 01/16/2015 17:55   Ct Head Wo Contrast  01/16/2015   CLINICAL DATA:  Took a fall 3 months ago. Suffered TBI. Patient has had seizure activity since the event. Event today while on the toilet began leaning and posturing. Fall yesterday to the right side.  EXAM: CT HEAD WITHOUT CONTRAST  TECHNIQUE: Contiguous axial images were obtained from the base of the skull through the vertex without intravenous contrast.  COMPARISON:  CT head 10/15/2014  FINDINGS: There is a left frontotemporal subdural hematoma measuring 1.5 cm maximum diameter. A smaller right frontotemporal subdural hematoma measures 4.3 mm. There is 3 mm of left-to-right midline shift.  Encephalomalacia is identified in  the left frontal region and right cerebellum.  No significant change in right skullbase fracture as identified on studies in March. There is atherosclerotic calcification of the internal carotid arteries.  IMPRESSION: 1. New bilateral subdural hematomas, left greater than right, associated with minimal left-to-right midline shift. 2. No new calvarial fracture. 3. 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 4. Critical Value/emergent results were called by telephone at the time of interpretation on 01/16/2015 at 3:59 pm to Dr. Lajean Saver , who verbally acknowledged these results.   Electronically Signed   By: Nolon Nations M.D.   On: 01/16/2015 16:00    Positive ROS: All other systems have been reviewed and were otherwise negative with the exception of those mentioned in the HPI and as above.  Labs cbc  Recent Labs  01/16/15 1517  WBC 3.1*  HGB 10.7*  HCT 31.4*  PLT 105*    Labs inflam No results for input(s): CRP in the last 72 hours.  Invalid input(s): ESR  Labs coag No results for input(s): INR, PTT in the last 72 hours.  Invalid input(s): PT   Recent Labs  01/16/15 1517  NA 141  K 4.0  CL 109  CO2 26  GLUCOSE 140*  BUN 12  CREATININE 0.60  CALCIUM 9.0    Physical Exam: Filed Vitals:   01/16/15 1715  BP: 154/68  Pulse: 84  Temp:   Resp: 19   General: Alert, no acute distress Cardiovascular: No pedal edema Respiratory: No cyanosis, no use of accessory musculature GI: No organomegaly, abdomen is soft and non-tender Skin: No lesions in the area of chief complaint other than those listed below in MSK exam.  Neurologic: Sensation intact distally Psychiatric: Patient is demented at baseline Lymphatic: No axillary or cervical lymphadenopathy  MUSCULOSKELETAL:  RUE: pain with large arc motion. Mild swelling and echymosis, distally +EPL/FPL/IO, 2+ pulses Other extremities are atraumatic with  painless ROM and NVI.  Assessment: R proximal humerus fracture  Plan: Non-op Sling for comfort F/u with me in 1-2 weeks in clinic.   I will sign off at this time.    Renette Butters, MD Cell [phone]   01/16/2015 6:13 PM

## 2015-01-16 NOTE — ED Notes (Addendum)
Per EMS- pt took a fall 3 months, suffered TBI - since this event, pt has had seizure activity; pt takes Depakote. Pt husband reported pt was on the toilet and began started leaning to left, with posturing. Pt husband layed pt on the floor. Pt had a fall yesterday to right side. Pt alert/oriented to self. VSS.

## 2015-01-16 NOTE — ED Notes (Signed)
Admitting at bedside as well as EDP Steinl discussing pt status with family.

## 2015-01-17 ENCOUNTER — Inpatient Hospital Stay (HOSPITAL_COMMUNITY): Payer: Medicare Other

## 2015-01-17 ENCOUNTER — Other Ambulatory Visit: Payer: Self-pay

## 2015-01-17 DIAGNOSIS — R4182 Altered mental status, unspecified: Secondary | ICD-10-CM | POA: Insufficient documentation

## 2015-01-17 DIAGNOSIS — D619 Aplastic anemia, unspecified: Secondary | ICD-10-CM

## 2015-01-17 DIAGNOSIS — F3160 Bipolar disorder, current episode mixed, unspecified: Secondary | ICD-10-CM

## 2015-01-17 MED ORDER — FOLIC ACID 1 MG PO TABS
1.0000 mg | ORAL_TABLET | Freq: Every day | ORAL | Status: DC
  Administered 2015-01-18: 1 mg via ORAL
  Filled 2015-01-17: qty 1

## 2015-01-17 MED ORDER — VITAMIN B-1 100 MG PO TABS
100.0000 mg | ORAL_TABLET | Freq: Every day | ORAL | Status: DC
  Administered 2015-01-18: 100 mg via ORAL
  Filled 2015-01-17: qty 1

## 2015-01-17 MED ORDER — ENSURE ENLIVE PO LIQD
237.0000 mL | Freq: Two times a day (BID) | ORAL | Status: DC
  Administered 2015-01-18 – 2015-01-19 (×3): 237 mL via ORAL

## 2015-01-17 NOTE — Progress Notes (Signed)
Pt arrived at 4N32. VSS. Alert and oriented to self. Denies any pain. Bed in lowest position. Call light within reach.  Shella SpearingKahin, RN

## 2015-01-17 NOTE — Progress Notes (Signed)
UR COMPLETED  

## 2015-01-17 NOTE — Evaluation (Signed)
Physical Therapy Evaluation Patient Details Name: Sharon [name] MRN: [id number] DOB: Jul 08, 1946 Today's Date: 01/17/2015   History of Present Illness  pt presents after falls at home sustaining bil SDHs, possible Seizure activity, and R Humerus fx.  pt with hx of Dementia and TBI.    Clinical Impression  Pt very agreeable to mobility and requires A for all aspects of mobility.  No family present to discuss D/C planning, but feel pt will need SNF level of care at D/C for safety.  Will continue to follow.      Follow Up Recommendations SNF    Equipment Recommendations  None recommended by PT    Recommendations for Other Services       Precautions / Restrictions Precautions Precautions: Fall Required Braces or Orthoses: Sling Restrictions Weight Bearing Restrictions: No Other Position/Activity Restrictions: No orders written, but attempted to keep pt NWBing on R UE.        Mobility  Bed Mobility Overal bed mobility: Needs Assistance Bed Mobility: Supine to Sit;Sit to Supine     Supine to sit: Mod assist;HOB elevated Sit to supine: Min assist   General bed mobility comments: pt needs cues not to use R UE with bed mobility and tends to try and bring it out of the sling to use.    Transfers Overall transfer level: Needs assistance Equipment used: None Transfers: Sit to/from [employer] Sit to Stand: Min assist Stand pivot transfers: Min assist       General transfer comment: pt needs cues and A for safety and balance.  pt impulsive and has difficulty following some directions.    Ambulation/Gait                Stairs            Wheelchair Mobility    Modified Rankin (Stroke Patients Only)       Balance Overall balance assessment: Needs assistance;History of Falls Sitting-balance support: No upper extremity supported;Feet supported Sitting balance-Leahy Scale: Fair     Standing balance support: No upper extremity supported;During  functional activity Standing balance-Leahy Scale: Poor Standing balance comment: pt needs A to maintain balance and safety.                               Pertinent Vitals/Pain Pain Assessment: No/denies pain    Home Living Family/patient expects to be discharged to:: Skilled nursing facility                 Additional Comments: pt had been home with husband and per chart review had Hospice involvement at home.      Prior Function Level of Independence: Needs assistance   Gait / Transfers Assistance Needed: Unclear, but per chart was ambulatory.    ADL's / Homemaking Assistance Needed: Per chart husband provided A for ADLs, but unclear extent of A.          Hand Dominance        Extremity/Trunk Assessment   Upper Extremity Assessment: Defer to OT evaluation           Lower Extremity Assessment: Generalized weakness      Cervical / Trunk Assessment: Kyphotic  Communication   Communication:  (Unclear if this is baseline as chart mentions expressive.)  Cognition Arousal/Alertness: Awake/alert Behavior During Therapy: WFL for tasks assessed/performed Overall Cognitive Status: No family/caregiver present to determine baseline cognitive functioning  General Comments      Exercises        Assessment/Plan    PT Assessment Patient needs continued PT services  PT Diagnosis Difficulty walking;Generalized weakness   PT Problem List Decreased strength;Decreased activity tolerance;Decreased balance;Decreased mobility;Decreased coordination;Decreased cognition;Decreased knowledge of use of DME;Decreased safety awareness  PT Treatment Interventions DME instruction;Gait training;Functional mobility training;Therapeutic activities;Therapeutic exercise;Balance training;Neuromuscular re-education;Cognitive remediation;Patient/family education   PT Goals (Current goals can be found in the Care Plan section) Acute Rehab PT  Goals Patient Stated Goal: pt unable to state. PT Goal Formulation: Patient unable to participate in goal setting Time For Goal Achievement: 01/31/15 Potential to Achieve Goals: Fair    Frequency Min 2X/week   Barriers to discharge        Co-evaluation               End of Session Equipment Utilized During Treatment: Gait belt Activity Tolerance: Patient tolerated treatment well Patient left: in bed;with call bell/phone within reach;with bed alarm set Nurse Communication: Mobility status         Time: [id number] PT Time Calculation (min) (ACUTE ONLY): 26 min   Charges:   PT Evaluation $Initial PT Evaluation Tier I: 1 Procedure PT Treatments $Therapeutic Activity: 8-22 mins   PT G CodesSunny Schlein:        Erastus Bartolomei F, [place]PT [phone] 01/17/2015, 10:38 AM

## 2015-01-17 NOTE — Procedures (Signed)
History: 69 yo F with bilateral SDH  Sedation: None  Technique: This is a 19 channel routine scalp EEG performed at the bedside with bipolar and monopolar montages arranged in accordance to the international 10/20 system of electrode placement. One channel was dedicated to EKG recording.    Background: The background consists of generalized irregular delta activity. There is some superimposed more normal appearing alpha. There is a posterior dominant rhythm of 8 Hz. At times, there is a predominance of the delta activity in the right hemisphere.   Photic stimulation: Physiologic driving is not performed  EEG Abnormalities: 1) Generalized irregular delta with slight right hemispheric predominance.   Clinical Interpretation: This EEG is consistent with a generalized non-specific cerebral dysfunction(encephalopathy). In addition there is suggestion of superimposed right hemispheric dysfunction, though  voltage of the delta on the left being attenuated due to the SDH is a likely possibility. There was no seizure or seizure predisposition recorded on this study.   Ritta Slot, MD [facility] [phone]  If 7pm- 7am, please page neurology on call as listed in AMION.

## 2015-01-17 NOTE — Progress Notes (Signed)
EEG completed; results pending.    

## 2015-01-17 NOTE — Progress Notes (Addendum)
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 The patient suffered respiratory failure requiring ICU during that admission. She was subsequently discharged to CIR, and subsequently went to [facility] and ultimately home thereafter. The patient fell again on 12/20/2014. On the morning of 01/15/2015, the patient fell on her way to the bathroom onto her right side. Her husband helped her back up. On the afternoon of 01/16/2015, the patient was sitting on the commode when she fell onto her right side and was caught between the commode and the bathtub. Her husband came to her assistance, and he stated that the patient was not responsive at the time, but he did not witness any tonic-clonic activity. EMS was activated. By the time EMS arrived, the patient was awake and alert. Workup in the emergency department revealed subdural hematoma left greater than right with mild left-to-right shift. Assessment/Plan: Traumatic subdural hematoma -Neurosurgery was consulted by EDP, did not feel the patient needed acute surgical intervention -Repeat CT brain 01/18/2015 -Physical therapy recommended skilled nursing facility -Speech therapy recommends dysphagia 3 diet with thin liquids  -Patient is alert and awake and conversant today  Seizure disorder  -Repeat EEG  -Continue Keppra switch keppra to po -Patient's husband ensures her compliance as he dispenses her medications  Transient Loss of Consciousness -unwitnessed  -unclear if pt was trying to get up from commode during the episode -orthostatics -telemetry monitoring Left Humeral Neck Fracture -to be seen by Dr. Eulah Pont -remain in sling for now Dysphagia -speech recommends Dys 3 with thins Dementia with behavioral disturbance -As  above Bipolar disorder, mixed -Continue Seroquel and Ativan  Urinary frequency -Continue preadmission [place] noting automatic therapeutic substitution -Continue Flomax as well -Urinalysis negative for pyuria  Acquired pancytopenia -This is been chronic dating back to April 2008, with intermittent worsening due to consumptive process -Suspect medication effect -We'll need outpatient hematology evaluation -Serum B12, RBC folate  Family Communication:  Husband updated on phone Disposition Plan:   SNF when medically stable, transfer to med-tele       Procedures/Studies: Dg Shoulder Right  01/16/2015   CLINICAL DATA:  Trauma right shoulder pain hematoma on right arm  EXAM: RIGHT SHOULDER - 2+ VIEW  COMPARISON:  None.  FINDINGS: Nondisplaced fracture through the surgical neck of the humerus. Humerus appears appropriately located relative to the glenoid.  IMPRESSION: Acute fracture surgical neck of the humerus   Electronically Signed   By: Esperanza Heir M.D.   On: 01/16/2015 17:55   Ct Head Wo Contrast  01/16/2015   CLINICAL DATA:  Took a fall 3 months ago. Suffered TBI. Patient has had seizure activity since the event. Event today while on the toilet began leaning and posturing. Fall yesterday to the right side.  EXAM: CT HEAD WITHOUT CONTRAST  TECHNIQUE: Contiguous axial images were obtained from the base of the skull through the vertex without intravenous contrast.  COMPARISON:  CT head 10/15/2014  FINDINGS: There is a left frontotemporal subdural hematoma measuring 1.5 cm maximum diameter. A smaller right frontotemporal subdural hematoma measures 4.3 mm. There is 3 mm of left-to-right midline shift.  Encephalomalacia is identified in the left frontal region and right cerebellum.  No significant change in right skullbase fracture as identified on studies in March. There is  atherosclerotic calcification of the internal carotid arteries.  IMPRESSION: 1. New bilateral subdural hematomas,  left greater than right, associated with minimal left-to-right midline shift. 2. No new calvarial fracture. 3. 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 4. Critical Value/emergent results were called by telephone at the time of interpretation on 01/16/2015 at 3:59 pm to Dr. Cathren Laine , who verbally acknowledged these results.   Electronically Signed   By: Norva Pavlov M.D.   On: 01/16/2015 16:00        Subjective:  patient, the headache but denies any visual disturbance, worsening focal extremity weakness, chest pain, shortness breath, nausea, vomiting, diarrhea, abdominal pain. There is no dysuria or hematuria.  Objective: Filed Vitals:   01/17/15 0035 01/17/15 0347 01/17/15 0743 01/17/15 1122  BP: 114/55 157/65 140/98 105/53  Pulse: 85 70 76 77  Temp: 98.7 F (37.1 C) 98.3 F (36.8 C) 98 F (36.7 C) 98.5 F (36.9 C)  TempSrc: Oral Oral Oral Oral  Resp: Height:      Weight:      SpO2: 96% 98% 94% 96%    Intake/Output Summary (Last 24 hours) at 01/17/15 1153 Last data filed at 01/17/15 1030  Gross per 24 hour  Intake    195 ml  Output      0 ml  Net    195 ml   Weight change:  Exam:   General:  Pt is alert, follows commands appropriately, not in acute distress  HEENT: No icterus, No thrush, No meningismus, [place]/AT  Cardiovascular: RRR, S1/S2, no rubs, no gallops  Respiratory: CTA bilaterally, no wheezing, no crackles, no rhonchi  Abdomen: Soft/+BS, non tender, non distended, no guarding  Extremities: No edema, No lymphangitis, No petechiae, No rashes, no synovitis  Data Reviewed: Basic Metabolic Panel:  Recent Labs Lab 01/16/15 1517  NA 141  K 4.0  CL 109  CO2 26  GLUCOSE 140*  BUN 12  CREATININE 0.60  CALCIUM 9.0   Liver Function Tests:  Recent Labs Lab 01/16/15 1517  AST 22  ALT 32  ALKPHOS 104  BILITOT 0.8  PROT 6.1*  ALBUMIN 3.8   No results for  input(s): LIPASE, AMYLASE in the last 168 hours. No results for input(s): AMMONIA in the last 168 hours. CBC:  Recent Labs Lab 01/16/15 1517  WBC 3.1*  HGB 10.7*  HCT 31.4*  MCV 87.7  PLT 105*   Cardiac Enzymes: No results for input(s): CKTOTAL, CKMB, CKMBINDEX, TROPONINI in the last 168 hours. BNP: Invalid input(s): POCBNP CBG: No results for input(s): GLUCAP in the last 168 hours.  Recent Results (from the past 240 hour(s))  MRSA PCR Screening     Status: None   Collection Time: 01/16/15  7:00 PM  Result Value Ref Range Status   MRSA by PCR NEGATIVE NEGATIVE Final    Comment:        The GeneXpert MRSA Assay (FDA approved for NASAL specimens only), is one component of a comprehensive MRSA colonization surveillance program. It is not intended to diagnose MRSA infection nor to guide or monitor treatment for MRSA infections.      Scheduled Meds: . antiseptic oral rinse  7 mL Mouth Rinse q12n4p  . chlorhexidine  15 mL Mouth Rinse BID  . citalopram  20 mg Oral QHS  . darifenacin  7.5 mg Oral Daily  . folic acid  1 mg Intravenous Daily  . levETIRAcetam  500 mg  Intravenous Q12H  . LORazepam  0.5 mg Oral BID  . QUEtiapine  200 mg Oral BID  . sodium chloride  3 mL Intravenous Q12H  . tamsulosin  0.4 mg Oral QHS  . thiamine  100 mg Intravenous Daily   Continuous Infusions:    Zalea Pete, DO  [facility] Pager [phone]  If 7PM-7AM, please contact night-coverage [email or web address] Password [facility] 01/17/2015, 11:53 AM   LOS: 1 day

## 2015-01-17 NOTE — Progress Notes (Signed)
OT Cancellation Note  Patient Details Name: IOMA MENCER MRN: [id number] DOB: 10-Oct-1945   Cancelled Treatment:    Reason Eval/Treat Not Completed: Other (comment). OT order is for today, but not to start until after 5:12 pm today and PT order is to start tomorrow. Will initiate eval tomorrow as appropriate.  Evette Georges [phone] 01/17/2015, 7:36 AM

## 2015-01-17 NOTE — Progress Notes (Signed)
Loren Racer 3S-Room 15-[facility] RN Visit-Stacie Meredith Mody RN, BSN  This is a related, covered admission to her [facility] diagnosis of intracerebral hemorrhage.  Patient has a gold DNR at home. Per [facility] RN, husband stated he would bring the DNR to the hospital. Patient was transported to [facility] ED via EMS after a fall at home.  [facility] was not notified until after the admission.  According to family friend, Vernona Rieger, at bedside patient has experienced many falls in the home leading up to this admission.  Patient seen in room laying in bed.  Patient appears uncomfortable with facial grimacing noted, however denies pain.  Her Rt arm is in a sling.  Family friend, Vernona Rieger stated she had sustained a small fracture in her Rt shoulder following the fall at home. Patient has received one dose of Tylenol in the past 24 hours and two doses of 0.5mg  Ativan po.  She is receiving Keppra IV BID via a peripheral line.  [facility] will continue to follow and anticipate any discharge needs.  [facility] medication list and transfer summary placed in chart.  Chelsea Aus RN, BSN [facility] Liaison [phone]

## 2015-01-17 NOTE — Clinical Social Work Note (Signed)
CSW Consult Acknowledged:   CSW received a consult for SNF placement. CSW awaiting PT/OT evaluation to determine the appropriate level of care.     CSW attempted to contact the pt's husband to discharge disposition. CSW is awaiting a call back.   Javian Nudd, MSW, LCSWA [phone]

## 2015-01-17 NOTE — Evaluation (Signed)
Occupational Therapy Evaluation Patient Details Name: Sharon [name] MRN: [id number] DOB: May 06, 1946 Today's Date: 01/17/2015    History of Present Illness pt presents after falls at home sustaining bil SDHs, possible Seizure activity, and R Humerus fx.  pt with hx of Dementia and TBI.  Pt receiving Hospice/Palliative Care at home.    Clinical Impression   Pt admitted with above. She demonstrates the below listed deficits and will benefit from continued OT to maximize safety and independence with BADLs.  Pt with h/o dementia and no family present to determine her baseline.  Currently, she is requiring max - total A for ADLs and min A for functional mobility.   She will require 24 hour physical assist at discharge.  Recommend SNF.       Follow Up Recommendations  SNF;Supervision/Assistance - 24 hour    Equipment Recommendations  None recommended by OT    Recommendations for Other Services       Precautions / Restrictions Precautions Precautions: Fall;Shoulder Type of Shoulder Precautions: sling to Rt UE at all times for 6 wks  Shoulder Interventions: At all times;Shoulder sling/immobilizer Required Braces or Orthoses: Sling Restrictions Weight Bearing Restrictions: Yes RUE Weight Bearing: Non weight bearing Other Position/Activity Restrictions: No order for WBing written, but due to nature of fracture will keep pt NWB on Rt UE       Mobility Bed Mobility Overal bed mobility: Needs Assistance Bed Mobility: Supine to Sit;Sit to Supine     Supine to sit: Max assist Sit to supine: Min assist   General bed mobility comments: Pt initially resisting OOB.  Assisted with moving her LEs off bed, and she would lift them back onto bed.  Required assist to move LEs off bed and to lift trunk   Transfers Overall transfer level: Needs assistance Equipment used: 1 person hand held assist Transfers: Sit to/from Stand;Stand Pivot Transfers Sit to Stand: Min assist Stand pivot  transfers: Min assist       General transfer comment: Pt requires assist as well as verbal and tactile cues for balance as well as to guide pt     Balance Overall balance assessment: Needs assistance Sitting-balance support: Feet supported Sitting balance-Leahy Scale: Poor Sitting balance - Comments: She required min A to maintain sitting balance on toilet    Standing balance support: Single extremity supported Standing balance-Leahy Scale: Poor Standing balance comment: requires min A to maintain balance                             ADL Overall ADL's : Needs assistance/impaired Eating/Feeding: Maximal assistance;Bed level;Sitting   Grooming: Wash/dry hands;Oral care;Wash/dry face;Brushing hair;Maximal assistance;Sitting   Upper Body Bathing: Maximal assistance;Sitting;Bed level   Lower Body Bathing: Total assistance;Sit to/from stand   Upper Body Dressing : Total assistance;Sitting   Lower Body Dressing: Total assistance;Sit to/from stand   Toilet Transfer: Minimal assistance;Ambulation;Comfort height toilet   Toileting- Clothing Manipulation and Hygiene: Total assistance Toileting - Clothing Manipulation Details (indicate cue type and reason): Pt is obviously Rt handed.  Due to Rt UE being immobilized in sling, pt unable to problem solve how to perfom peri care.  She repeatedly placed toilet paper in her Rt hand, and attempted to move hand out of sling.  When she was unable to do so successfully, she just stopped and retried      Functional mobility during ADLs: Minimal assistance General ADL Comments: Pt limited by significant cognitive impairment.  Max assist for problem solving and safety      Vision     Perception     Praxis      Pertinent Vitals/Pain Pain Assessment: Faces Faces Pain Scale: No hurt     Hand Dominance Right   Extremity/Trunk Assessment Upper Extremity Assessment Upper Extremity Assessment: Generalized weakness;RUE  deficits/detail RUE Deficits / Details: Rt UE immobilized.  Hand appears WFL  RUE: Unable to fully assess due to immobilization   Lower Extremity Assessment Lower Extremity Assessment: Defer to PT evaluation   Cervical / Trunk Assessment Cervical / Trunk Assessment: Kyphotic   Communication Communication Communication: Expressive difficulties (speech mumbled and difficult to understand )   Cognition Arousal/Alertness: Lethargic Behavior During Therapy: Restless Overall Cognitive Status: No family/caregiver present to determine baseline cognitive functioning                     General Comments       Exercises       Shoulder Instructions      Home Living Family/patient expects to be discharged to:: Skilled nursing facility                                 Additional Comments: pt had been home with husband and per chart review had Hospice involvement at home.        Prior Functioning/Environment Level of Independence: Needs assistance  Gait / Transfers Assistance Needed: Unclear, but per chart was ambulatory.   ADL's / Homemaking Assistance Needed: Per chart husband provided A for ADLs, but unclear extent of A.          OT Diagnosis: Generalized weakness;Cognitive deficits   OT Problem List: Decreased strength;Decreased range of motion;Decreased activity tolerance;Impaired balance (sitting and/or standing);Decreased coordination;Decreased cognition;Decreased safety awareness;Decreased knowledge of use of DME or AE;Decreased knowledge of precautions;Impaired UE functional use;Pain   OT Treatment/Interventions: Self-care/ADL training;DME and/or AE instruction;Therapeutic activities;Balance training;Patient/family education;Cognitive remediation/compensation    OT Goals(Current goals can be found in the care plan section) Acute Rehab OT Goals OT Goal Formulation: Patient unable to participate in goal setting Time For Goal Achievement:  01/31/15 Potential to Achieve Goals: Fair ADL Goals Pt Will Perform Eating: with set-up;with supervision;sitting Pt Will Perform Grooming: with min assist;standing Pt Will Transfer to Toilet: with min guard assist;ambulating;regular height toilet;bedside commode;grab bars Pt Will Perform Toileting - Clothing Manipulation and hygiene: with min assist;sit to/from stand  OT Frequency: Min 2X/week   Barriers to D/C:            Co-evaluation              End of Session Nurse Communication: Mobility status;Other (comment) (diarrhea )  Activity Tolerance: Patient limited by fatigue Patient left: in bed;with call bell/phone within reach;with bed alarm set   Time: 4401-0272 OT Time Calculation (min): 16 min Charges:  OT General Charges $OT Visit: 1 Procedure OT Evaluation $Initial OT Evaluation Tier I: 1 Procedure G-Codes:    Jeani Hawking M 02/05/15, 4:54 PM

## 2015-01-17 NOTE — Progress Notes (Signed)
Initial Nutrition Assessment  DOCUMENTATION CODES:  Not applicable  INTERVENTION:  Ensure Enlive (each supplement provides 350kcal and 20 grams of protein) (BID)   Encourage adequate PO intake.   NUTRITION DIAGNOSIS:  Increased nutrient needs related to  (illness) as evidenced by estimated needs.  GOAL:  Patient will meet greater than or equal to 90% of their needs  MONITOR:  PO intake, Supplement acceptance, Weight trends, Labs, I & O's  REASON FOR ASSESSMENT:  Malnutrition Screening Tool    ASSESSMENT: Pt with history of traumatic brain injury after a fall in April 2016. This resulted in intracerebral hemorrhage. She had a witnessed fall on 6/25. Since the fall the husband witnessed the patient having a seizure while she was sitting on the toilet; she became unresponsive. CT of the head without contrast was completed in the ER and this revealed bilateral subdural hematomas.  Pt is familiar to this RD due to previous admission. Pt reports having a good appetite currently and PTA. Current meal completion is 75%. Weight has been stable. Pt is agreeable to Ensure to aid in caloric and protein needs. Pt reports she has been consuming Ensure on occasion. RD to order. Pt was encouraged to eat her food at meals and to drink her supplements.   Nutrition-Focused physical exam completed. Pt with moderate muscle mass depletion.   Labs and medications reviewed.   Height:  Ht Readings from Last 1 Encounters:  01/16/15 5\' 4"  (1.626 m)    Weight:  Wt Readings from Last 1 Encounters:  01/16/15 110 lb 3.7 oz (50 kg)    Ideal Body Weight:  54.5 kg  Wt Readings from Last 10 Encounters:  01/16/15 110 lb 3.7 oz (50 kg)  11/16/14 101 lb 1.6 oz (45.859 kg)  10/25/14 113 lb 1.6 oz (51.302 kg)  07/09/14 108 lb 6.4 oz (49.17 kg)  06/03/14 112 lb 4.8 oz (50.939 kg)    BMI:  Body mass index is 18.91 kg/(m^2).  Estimated Nutritional Needs:  Kcal:  1500-1800  Protein:  70-85  grams  Fluid:  >/= 1.5 L/day  Skin:  Reviewed, no issues  Diet Order:  DIET DYS 3 Room service appropriate?: Yes; Fluid consistency:: Thin  EDUCATION NEEDS:  No education needs identified at this time   Intake/Output Summary (Last 24 hours) at 01/17/15 1407 Last data filed at 01/17/15 1030  Gross per 24 hour  Intake    195 ml  Output      0 ml  Net    195 ml    Last BM:  6/25  Roslyn SmilingStephanie Katharina Jehle, MS, RD, LDN Pager # [phone] After hours/ weekend pager # [phone]

## 2015-01-17 NOTE — Evaluation (Signed)
Clinical/Bedside Swallow Evaluation Patient Details  Name: Sharon [name] MRN: [id number] Date of Birth: 1946-03-01  Today's Date: 01/17/2015 Time: SLP Start Time (ACUTE ONLY): 0911 SLP Stop Time (ACUTE ONLY): 0928 SLP Time Calculation (min) (ACUTE ONLY): 17 min  Past Medical History:  Past Medical History  Diagnosis Date  . Bipolar 1 disorder   . Dementia   . Other pancytopenia 06/03/2014  . Stroke    Past Surgical History:  Past Surgical History  Procedure Laterality Date  . Cholecystectomy     HPI:  Pt is a 69 year old female admitted after recurrent falls, concern for seizue activity s/p TBI in 4/16, resulted in dyspahgia, progressed from honey teaspoon to thin liquids on last OP MBS    Assessment / Plan / Recommendation Clinical Impression  Pt demonstrates appearance of swallow function consistent with last objective evaluation. Pt is able to tolerate small sips of thin liquids with no signs of aspiration. Pt takes small sips independently. Straw sips do result in delayed, consistent cough. Pt is edentulous and lingual thrusting causes slow bolus manipulation. Recommend pt initiate a dys 3 (mechanical soft) diet with thin liquids, no straws. SLP will f/u for tolerance.     Aspiration Risk  Mild    Diet Recommendation Thin;Dysphagia 3 (Mech soft)   Medication Administration: Whole meds with liquid    Other  Recommendations Oral Care Recommendations: Oral care BID   Follow Up Recommendations       Frequency and Duration min 2x/week  1 week   Pertinent Vitals/Pain NA    SLP Swallow Goals     Swallow Study Prior Functional Status       General Other Pertinent Information: Pt is a 69 year old female admitted after recurrent falls, concern for seizue activity s/p TBI in 4/16, resulted in dyspahgia, progressed from honey teaspoon to thin liquids on last OP MBS  Type of Study: Bedside swallow evaluation Diet Prior to this Study: NPO Temperature Spikes Noted:  No Respiratory Status: Room air History of Recent Intubation: No Behavior/Cognition: Alert;Cooperative;Pleasant mood Oral Cavity - Dentition: Edentulous Self-Feeding Abilities: Able to feed self Patient Positioning: Upright in bed Baseline Vocal Quality: Normal Volitional Cough: Strong Volitional Swallow: Able to elicit    Oral/Motor/Sensory Function Overall Oral Motor/Sensory Function: Appears within functional limits for tasks assessed   Ice Chips     Thin Liquid Thin Liquid: Impaired Presentation: Cup;Straw Pharyngeal  Phase Impairments: Cough - Delayed (with straw)    Nectar Thick Nectar Thick Liquid: Not tested   Honey Thick Honey Thick Liquid: Not tested   Puree Puree: Within functional limits   Solid   GO    Solid: Impaired Presentation: Self Fed Oral Phase Impairments: Impaired mastication;Reduced lingual movement/coordination      Sharon [name], [place]MA CCC-SLP [phone]  Sharon [name], Sharon [name] 01/17/2015,9:34 AM

## 2015-01-17 NOTE — Care Management Note (Signed)
Case Management Note  Patient Details  Name: Sharon [name] MRN: [id number]  Date of Birth: December 22, 1945  Subjective/Objective:                 From home with husband admitted with bilat. SDH. History of traumatic brain injury  resulting from a fall in April 2016 .   Action/Plan:  SNF when medically stable. CM to f/u with d/c needs. Expected Discharge Date:                  Expected Discharge Plan:  Skilled Nursing Facility (CSW [facility])  In-House Referral:  Clinical Social Work  Discharge planning Services  CM Consult  Post Acute Care Choice:    Choice offered to:     DME Arranged:    DME Agency:     HH Arranged:    HH Agency:     Status of Service:  In process, will continue to follow  Medicare Important Message Given:    Date Medicare IM Given:    Medicare IM give by:    Date Additional Medicare IM Given:    Additional Medicare Important Message give by:     If discussed at Long Length of Stay Meetings, dates discussed:    Additional Comments: Delailah Durgin (Spouse), [phone]  Epifanio Lesches, RN 01/17/2015, 12:47 PM

## 2015-01-18 ENCOUNTER — Inpatient Hospital Stay (HOSPITAL_COMMUNITY): Payer: Medicare Other

## 2015-01-18 ENCOUNTER — Encounter (HOSPITAL_COMMUNITY): Payer: Self-pay | Admitting: Internal Medicine

## 2015-01-18 DIAGNOSIS — F0391 Unspecified dementia with behavioral disturbance: Secondary | ICD-10-CM

## 2015-01-18 LAB — CBC
HCT: 27.5 % — ABNORMAL LOW (ref 36.0–46.0)
HEMOGLOBIN: 9.4 g/dL — AB (ref 12.0–15.0)
MCH: 29.7 pg (ref 26.0–34.0)
MCHC: 34.2 g/dL (ref 30.0–36.0)
MCV: 86.8 fL (ref 78.0–100.0)
PLATELETS: 112 10*3/uL — AB (ref 150–400)
RBC: 3.17 MIL/uL — AB (ref 3.87–5.11)
RDW: 12.9 % (ref 11.5–15.5)
WBC: 1.6 10*3/uL — AB (ref 4.0–10.5)

## 2015-01-18 LAB — VITAMIN B12: Vitamin B-12: 873 pg/mL (ref 180–914)

## 2015-01-18 MED ORDER — HALOPERIDOL LACTATE 5 MG/ML IJ SOLN: 0.5000 mg | INTRAMUSCULAR | Status: DC | PRN

## 2015-01-18 MED ORDER — QUETIAPINE FUMARATE 50 MG PO TABS
50.0000 mg | ORAL_TABLET | Freq: Two times a day (BID) | ORAL | Status: DC
  Administered 2015-01-18 – 2015-01-19 (×2): 50 mg via ORAL
  Filled 2015-01-18 (×2): qty 1

## 2015-01-18 MED ORDER — LEVETIRACETAM 500 MG PO TABS
500.0000 mg | ORAL_TABLET | Freq: Two times a day (BID) | ORAL | Status: DC
  Administered 2015-01-18: 500 mg via ORAL
  Filled 2015-01-18: qty 1

## 2015-01-18 MED ORDER — MORPHINE SULFATE (CONCENTRATE) 10 MG/0.5ML PO SOLN: 10.0000 mg | ORAL | Status: DC | PRN

## 2015-01-18 MED ORDER — MORPHINE SULFATE (CONCENTRATE) 10 MG/0.5ML PO SOLN: 10.0000 mg | Freq: Once | ORAL | Status: DC

## 2015-01-18 MED ORDER — LORAZEPAM 2 MG/ML IJ SOLN: 1.0000 mg | INTRAMUSCULAR | Status: DC | PRN

## 2015-01-18 MED ORDER — SODIUM CHLORIDE 0.9 % IV SOLN
500.0000 mg | Freq: Two times a day (BID) | INTRAVENOUS | Status: DC
  Administered 2015-01-18 – 2015-01-19 (×2): 500 mg via INTRAVENOUS
  Filled 2015-01-18 (×3): qty 5

## 2015-01-18 NOTE — Care Management (Signed)
Important Message  Patient Details  Name: Sharon [name] MRN: [id number] Date of Birth: [birth year]   Medicare Important Message Given:  Yes-second notification given    Orson AloeMegan P Tashia Leiterman 01/18/2015, 11:19 AM

## 2015-01-18 NOTE — Clinical Social Work Placement (Signed)
   CLINICAL SOCIAL WORK PLACEMENT  NOTE  Date:  01/18/2015  Patient Details  Name: Sharon [name] MRN: [id number] Date of Birth: 11-Jan-1946  Clinical Social Work is seeking post-discharge placement for this patient at the Skilled  Nursing Facility level of care (*CSW will initial, date and re-position this form in  chart as items are completed):  Yes   Patient/family provided with [facility] Clinical Social Work Department's list of facilities offering this level of care within the geographic area requested by the patient (or if unable, by the patient's family).  Yes   Patient/family informed of their freedom to choose among providers that offer the needed level of care, that participate in Medicare, Medicaid or managed care program needed by the patient, have an available bed and are willing to accept the patient.  Yes   Patient/family informed of [facility]'s ownership interest in [facility] and [facility], as well as of the fact that they are under no obligation to receive care at these facilities.  PASRR submitted to EDS on       PASRR number received on       Existing PASRR number confirmed on 01/18/15     FL2 transmitted to all facilities in geographic area requested by pt/family on       FL2 transmitted to all facilities within larger geographic area on       Patient informed that his/her managed care company has contracts with or will negotiate with certain facilities, including the following:        Yes   Patient/family informed of bed offers received.  Patient chooses bed at       Physician recommends and patient chooses bed at      Patient to be transferred to   on  .  Patient to be transferred to facility by       Patient family notified on   of transfer.  Name of family member notified:        PHYSICIAN       Additional Comment:    _______________________________________________ Gwynne EdingerBibbs, Benicio Manna, LCSW 01/18/2015, 1:47 PM

## 2015-01-18 NOTE — Progress Notes (Signed)
Loren RacerJoan [place]-[facility] 4N-Room 32-[facility] RN Visit-Stacie Meredith ModyStein RN, BSN  This is a related, covered admission to her [facility] diagnosis of intracerebral hemorrhage. Patient is a DNR.  Patient seen in room resting quietly.  Patient lethargic today and minimally responsive to verbal stimuli.  Per chart review, patient received one dose of 650mg  Tylenol for pain.  Patient eating approximately 75% of meals. No family present at this time.   Spoke with [facility] social work, Buyer, retailBeth, and currently patient is being evaluated for SNF placement.  He is aware that the hospice benefit will need to be revoked, if patient is admitted to a SNF for rehab services.  [facility] will continue to follow and anticipate any discharge needs.  Please call with any questions.  Chelsea AusStacie Stein RN, BSN [facility] Hospital Liaison [phone]

## 2015-01-18 NOTE — Progress Notes (Signed)
Had meeting with husband and son at bedside. Reinforced their discussion with Dr. Phillips OdorGolding. Updated them on pt's work up and clinical status. Son and husband are agreeable for residential hospice.   I have notified SW to assist in transition Continue present meds for now, but plan to d/c all non-essential meds at time of d/c to residential hospice. Will not order any further labs.  DTat

## 2015-01-18 NOTE — Progress Notes (Signed)
Speech Language Pathology Treatment: Dysphagia  Patient Details Name: Elenora GammaJoan L Stmarie MRN: [id number] DOB: 1946-04-25 Today's Date: 01/18/2015 Time: 1000-1019 SLP Time Calculation (min) (ACUTE ONLY): 19 min  Assessment / Plan / Recommendation Clinical Impression  Pt sleeping upon SLP entrance to room. Upon gentle verbal/tactile stimulation, pt awoke and stated "I'm hungry".  SLP provided pt with ice-cream, peaches, and water.  Excessive lingual-labial movement and lingual thrusting noted with bolus transiting.  Delayed pharyngeal swallow suspected but no indications of airway compromise nor residuals.    Removed single peach from right buccal area that pt did not sense nor clear.  However, pt was able to orally clear peaches earlier in session.  Pt is sleepy and suspect this is contributing to her decreased oral manipulation abilities.  Will continue same diet with very strict precautions, sign posted in room to maximize airway protection.    Of note, hospice personnel staff came to room and inquired to SLP if spouse of pt has been present this am- SLP has not seen pt's spouse.   SLP to follow up for education with spouse/family re: pt's chronic dysphagia and strategies to maximize airway protection, swallow efficiency.      HPI Other Pertinent Information: Pt is a 69 year old female admitted after recurrent falls, concern for seizue activity s/p TBI in 4/16, resulted in dyspahgia, progressed from honey teaspoon to thin liquids on last OP MBS.  MRI showed bilateral SDH.  Pt underwent BSE yesteday, seen today to determine tolerance of po diet and readiness for dietary advancement.    Pertinent Vitals Pain Assessment: Faces Faces Pain Scale: No hurt  SLP Plan  Continue with current plan of care    Recommendations Diet recommendations: Dysphagia 3 (mechanical soft);Thin liquid Medication Administration: Whole meds with puree Supervision: Staff to assist with self feeding Compensations: Slow  rate;Small sips/bites;Check for pocketing Postural Changes and/or Swallow Maneuvers: Seated upright 90 degrees;Upright 30-60 min after meal              Oral Care Recommendations: Oral care BID Follow up Recommendations: Other (comment) (TBD) Plan: Continue with current plan of care    GO     Donavan Burnetamara Saya Mccoll, MS [facility] SLP [phone]

## 2015-01-18 NOTE — Discharge Summary (Addendum)
Physician Discharge Summary  Sharon [name]:[id number] DOB: 04/07/1946 DOA: 01/16/2015  PCP: Gwen Pounds, MD  Admit date: 01/16/2015 Discharge date: 01/19/15 Recommendations for Outpatient Follow-up:  1. Pt will need to follow up with PCP in 2 weeks post discharge 2. Please obtain BMP and CBC in one week  Discharge Diagnoses:  Traumatic subdural hematoma -Neurosurgery was consulted by EDP, did not feel the patient needed acute surgical intervention -Repeat CT brain 01/18/2015--stable right side subdural hematoma; left frontotemporal subdural with shifting of hyperdense component -01/18/15--Case was discussed with neurosurgery Dr. Anise Salvo not feel pt needs surgical intervation -Physical therapy recommended skilled nursing facility -Speech therapy recommends dysphagia 3 diet with thin liquids  -Patient is alert and awake and conversant today  -stop ASA 81 mg for now -follow up with Dr. Conchita Paris in office in 2-3 weeks Seizure disorder  -Repeat EEG--no focal epileptiform discharges/seizure predisposition  -Continue Keppra switch keppra to po -Patient's husband ensures her compliance as he dispenses her medications  Transient Loss of Consciousness/Orthostatic hypotension -suspect orthostasis -unwitnessed  -unclear if pt was trying to get up from commode during the episode -orthostatics--positive-->pt will need to tolerate higher BP at baseline -telemetry monitoring--no concerning dysrhythmias -discontinue furosemide and lisinopril Left Humeral Neck Fracture -Dr. Gearldine Shown not feel that the patient needed operative management -remain in sling for 6 weeks Dysphagia -speech recommends Dys 3 with thins Dementia with behavioral disturbance -As above Bipolar disorder, mixed -Continue Seroquel and Ativan  Urinary frequency -Continue preadmission [place] noting automatic therapeutic substitution -Continue Flomax as well -Urinalysis negative for pyuria  Acquired  pancytopenia -This is been chronic dating back to April 2008, with intermittent worsening due to consumptive process -Suspect medication effect -Will need outpatient hematology evaluation -Serum B12--873  -RBC folate--pending  Goals of care -Long discussion with the patient's husband on multiple occasions -Patient is DO NOT RESUSCITATE -He desires continue medical care, but is not interested in any invasive procedures including surgeries at this time -Ultimate plan is to discharge the patient to skilled nursing facility with palliative medicine services  Discharge Condition: stable for d/c on 01/19/15  Disposition: SNF with palliative medicine services     Follow-up Information    Follow up with [facility], NEELESH, C, MD In 3 weeks.   Specialty:  Neurosurgery   Contact information:   [address] [place] [address] [phone]       Follow up with Sheral Apley, MD In 2 weeks.   Specialty:  Orthopedic Surgery   Contact information:   [address] [place] [address] [phone]       Follow up with Gwen Pounds, MD In 2 weeks.   Specialty:  Internal Medicine   Contact information:   [address] [place] [address] [phone]     home  Diet:dysphagia 3 with thin liquids Wt Readings from Last 3 Encounters:  01/16/15 50 kg (110 lb 3.7 oz)  11/16/14 45.859 kg (101 lb 1.6 oz)  10/25/14 51.302 kg (113 lb 1.6 oz)    History of present illness:  69 year old female with a history of traumatic brain injury with resulting from a fall in April 2016 which resulted in an intracerebral hemorrhage. The patient suffered respiratory failure requiring ICU during that admission. She was subsequently discharged to CIR, and subsequently went to [facility] and ultimately home thereafter. The patient fell again on 12/20/2014. On the morning of 01/15/2015, the patient fell on her way to the bathroom onto her right side. Her  husband  helped her back up. On the afternoon of 01/16/2015, the patient was sitting on the commode when she fell onto her right side and was caught between the commode and the bathtub. Her husband came to her assistance, and he stated that the patient was not responsive at the time, but he did not witness any tonic-clonic activity. EMS was activated. By the time EMS arrived, the patient was awake and alert. Workup in the emergency department revealed subdural hematoma left greater than right with mild left-to-right shift. Repeat CT of the brain on 01/10/2015 showed stable right-sided subdural hematoma with some shifting of the hyperdense component of the left subdural hematoma. The case was discussed with neurosurgery who did not feel the patient needed any surgical intervention at this time. Consultants: Palliative medicine Neurosurgery Ortho--[facility]  Discharge Exam: Filed Vitals:   01/18/15 0918  BP: 148/70  Pulse: 68  Temp: 98.2 F (36.8 C)  Resp: 16   Filed Vitals:   01/17/15 2136 01/18/15 0142 01/18/15 0602 01/18/15 0918  BP: 154/85 148/59 96/39 148/70  Pulse: 67 72 67 68  Temp: 98.2 F (36.8 C) 98.6 F (37 C) 97.9 F (36.6 C) 98.2 F (36.8 C)  TempSrc: Oral Oral Oral Oral  Resp: 18 18 16 16   Height:      Weight:      SpO2: 94% 98% 97% 100%   General: Awake and alert, NAD, pleasant, cooperative Cardiovascular: RRR, no rub, no gallop, no S3 Respiratory: CTAB, no wheeze, no rhonchi Abdomen:soft, nontender, nondistended, positive bowel sounds Extremities: No edema, No lymphangitis, no petechiae  Discharge Instructions     Medication List    STOP taking these medications        aspirin EC 81 MG tablet     furosemide 20 MG tablet  Commonly known as:  LASIX     lisinopril 2.5 MG tablet  Commonly known as:  PRINIVIL,ZESTRIL      TAKE these medications        acetaminophen 500 MG tablet  Commonly known as:  TYLENOL  Take 500 mg by mouth at bedtime as needed for moderate  pain.     citalopram 40 MG tablet  Commonly known as:  CELEXA  Take 20 mg by mouth at bedtime.     ferrous sulfate 325 (65 FE) MG tablet  Take 325 mg by mouth 2 (two) times daily with a meal.     levETIRAcetam 500 MG tablet  Commonly known as:  KEPPRA  Take 1 tablet (500 mg total) by mouth 2 (two) times daily.     LORazepam 0.5 MG tablet  Commonly known as:  ATIVAN  Take 1 tablet by mouth 2 (two) times daily as needed.     QUEtiapine 300 MG tablet  Commonly known as:  SEROQUEL  Take 0.5 tablets (150 mg total) by mouth 2 (two) times daily.     tamsulosin 0.4 MG Caps capsule  Commonly known as:  FLOMAX  Take 0.4 mg by mouth at bedtime.     trospium 20 MG tablet  Commonly known as:  SANCTURA  Take 20 mg by mouth 2 (two) times daily.     vitamin B-12 500 MCG tablet  Commonly known as:  CYANOCOBALAMIN  Take 1,000 mcg by mouth at bedtime.     Vitamin D3 1000 UNITS Caps  Take 1 capsule by mouth daily.     zinc sulfate 220 MG capsule  Take 220 mg by mouth daily.  The results of significant diagnostics from this hospitalization (including imaging, microbiology, ancillary and laboratory) are listed below for reference.    Significant Diagnostic Studies: Dg Shoulder Right  01/16/2015   CLINICAL DATA:  Trauma right shoulder pain hematoma on right arm  EXAM: RIGHT SHOULDER - 2+ VIEW  COMPARISON:  None.  FINDINGS: Nondisplaced fracture through the surgical neck of the humerus. Humerus appears appropriately located relative to the glenoid.  IMPRESSION: Acute fracture surgical neck of the humerus   Electronically Signed   By: Esperanza Heir M.D.   On: 01/16/2015 17:55   Ct Head Wo Contrast  01/18/2015   CLINICAL DATA:  69 year old female fall 2 days ago in 3 months ago. Follow-up subdural hematoma. Subsequent encounter.  EXAM: CT HEAD WITHOUT CONTRAST  TECHNIQUE: Contiguous axial images were obtained from the base of the skull through the vertex without intravenous contrast.   COMPARISON:  01/16/2015 and 10/14/2014.  FINDINGS: Loculated left frontal -temporal subdural hematoma with maximal thickness of 1.5 cm with overall similar size but with shifting of hyperdense component of left subdural hematoma. Local mass effect with midline shift to the right by 2.9 mm.  Right frontal broad-based subdural hematoma with maximal thickness of 4.3 mm without significant change.  Anterior left frontal moderate area of encephalomalacia and smaller area encephalomalacia right cerebellum without change and consistent with result of prior hematoma/ hemorrhagic contusion.  Complex fracture of the occipital bone/ skullbase greater on the right (extending to the clivus) noted as acute on 10/14/2014 and without significant change.  Small vessel disease type changes without CT evidence of large acute thrombotic infarct.  No intracranial mass lesion noted on this unenhanced exam.  Vascular calcifications.  IMPRESSION: When compared to the recent CT, there has been shifting of hyperdense component of loculated left frontal-temporal subdural hematoma with similar overall maximal thickness of 1.5 cm and 2.9 mm midline shift to the right.  No significant change right-sided subdural hematoma with maximal thickness of 4.3 mm.  Please see above.   Electronically Signed   By: Lacy Duverney M.D.   On: 01/18/2015 06:56   Ct Head Wo Contrast  01/16/2015   CLINICAL DATA:  Took a fall 3 months ago. Suffered TBI. Patient has had seizure activity since the event. Event today while on the toilet began leaning and posturing. Fall yesterday to the right side.  EXAM: CT HEAD WITHOUT CONTRAST  TECHNIQUE: Contiguous axial images were obtained from the base of the skull through the vertex without intravenous contrast.  COMPARISON:  CT head 10/15/2014  FINDINGS: There is a left frontotemporal subdural hematoma measuring 1.5 cm maximum diameter. A smaller right frontotemporal subdural hematoma measures 4.3 mm. There is 3 mm of  left-to-right midline shift.  Encephalomalacia is identified in the left frontal region and right cerebellum.  No significant change in right skullbase fracture as identified on studies in March. There is atherosclerotic calcification of the internal carotid arteries.  IMPRESSION: 1. New bilateral subdural hematomas, left greater than right, associated with minimal left-to-right midline shift. 2. No new calvarial fracture. 3. 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 4. Critical Value/emergent results were called by telephone at the time of interpretation on 01/16/2015 at 3:59 pm to Dr. Cathren Laine , who verbally acknowledged these results.   Electronically Signed   By: Norva Pavlov M.D.   On: 01/16/2015 16:00     Microbiology: Recent Results (from the past 240 hour(s))  MRSA PCR Screening  Status: None   Collection Time: 01/16/15  7:00 PM  Result Value Ref Range Status   MRSA by PCR NEGATIVE NEGATIVE Final    Comment:        The GeneXpert MRSA Assay (FDA approved for NASAL specimens only), is one component of a comprehensive MRSA colonization surveillance program. It is not intended to diagnose MRSA infection nor to guide or monitor treatment for MRSA infections.      Labs: Basic Metabolic Panel:  Recent Labs Lab 01/16/15 1517  NA 141  K 4.0  CL 109  CO2 26  GLUCOSE 140*  BUN 12  CREATININE 0.60  CALCIUM 9.0   Liver Function Tests:  Recent Labs Lab 01/16/15 1517  AST 22  ALT 32  ALKPHOS 104  BILITOT 0.8  PROT 6.1*  ALBUMIN 3.8   No results for input(s): LIPASE, AMYLASE in the last 168 hours. No results for input(s): AMMONIA in the last 168 hours. CBC:  Recent Labs Lab 01/16/15 1517 01/18/15 0650  WBC 3.1* 1.6*  HGB 10.7* 9.4*  HCT 31.4* 27.5*  MCV 87.7 86.8  PLT 105* 112*   Cardiac Enzymes: No results for input(s): CKTOTAL, CKMB, CKMBINDEX, TROPONINI in the last 168  hours. BNP: Invalid input(s): POCBNP CBG: No results for input(s): GLUCAP in the last 168 hours.  Time coordinating discharge:  Greater than 30 minutes  Signed:  Sarai January, DO [facility] Pager: [phone] 01/18/2015, 11:11 AM

## 2015-01-18 NOTE — Progress Notes (Signed)
Ms. Larina BrasStone is minimally responsive. She has a deep grimace on her face-she is difficult to arouse and does not answer to prompting about pain or discomfort. I placed gentle pressure on her which cause her to retract and moan. Her lunch is sitting in front of her untouched- she did eat some breakfast with full assist but her intake has been sporadic and minimal- RN reports that she sleeps through eating and is probably a high aspiration risk.  I spoke with her husband in detail- his goals are full comfort, to not unecessarily prolong her life and to not subject her to rehab that clearly will have little if any benefit given her irreversible brain damage.  Based on her condition at this moment and my assessment today I think she would be appropriate for hospice facility-she needs pain and agitation management above all else.I made this recopmmendation to Mr. Larina BrasStone who said that this had not been discussed as an option nor did he has a real sense of her prognosis or capacity for improvement- he tells me in his heart he knows she is dying and hopes for her peace soon.  I have added on prn opiates for pain and IV ativan for seizure in case she cannot swallow.  Will follow closely -will also communicate with [facility] team to coordinate care.  Anderson MaltaElizabeth Golding, DO Palliative Medicine [phone]

## 2015-01-18 NOTE — Clinical Social Work Note (Signed)
Clinical Social Work Assessment  Patient Details  Name: Sharon [name] MRN: [id number] Date of Birth: [birth year]  Date of referral:  01/17/15               Reason for consult:  Facility Placement              Housing/Transportation Living arrangements for the past 2 months:  Single Family Home Source of Information:  Spouse Cornelius Moras(Owen "Ray" Presley ) Patient Interpreter Needed:  None Criminal Activity/Legal Involvement Pertinent to Current Situation/Hospitalization:  No - Comment as needed Significant Relationships:  Spouse Lives with:  Spouse Do you feel safe going back to the place where you live?  No Need for family participation in patient care:  Yes (Comment)  Care giving concerns:  Ray reported that he no longer can care for the pt at home.    Social Worker assessment / plan:    Employment status:  Retired Health and safety inspectornsurance information:  Medicare PT Recommendations:  Skilled Nursing Facility Information / Referral to community resources:  Skilled Nursing Facility  Patient/Family's Response to care:  Ray reported that the staff has treated the pt well.   Patient/Family's Understanding of and Emotional Response to Diagnosis, Current Treatment, and Prognosis:  Ray acknowledged that the pt's health will not improve. Ray reported that he and his son has discussed and has come to terms with knowing the pt will not expires.   Emotional Assessment Appearance:   (Unable to Assess ) Attitude/Demeanor/Rapport:  Unable to Assess Affect (typically observed):  Unable to Assess Orientation:   (disoriented ) Alcohol / Substance use:  Not Applicable Psych involvement (Current and /or in the community):  No (Comment)  Discharge Needs  Concerns to be addressed:  Denies Needs/Concerns at this time Readmission within the last 30 days:  No Current discharge risk:  None Barriers to Discharge:  No Barriers Identified   Lisset Ketchem, LCSW 01/18/2015, 1:44 PM

## 2015-01-18 NOTE — Progress Notes (Signed)
No family present.  Tried contacting pt's husband via phone several times but line busy.  Pt is currently a hospice and palliative care of [place] pt.  If husband chooses for pt to go to a SNF for rehab then he will revoke pt's hospice medicare benefit in order for medicare to pay the cost of rehab.  LCSW will continue to try to contact husband to discuss this with him.  [place], [place]  [phone] ext:  [phone]

## 2015-01-19 DIAGNOSIS — I6201 Nontraumatic acute subdural hemorrhage: Secondary | ICD-10-CM

## 2015-01-19 LAB — FOLATE RBC
FOLATE, RBC: 1677 ng/mL (ref >498)
Folate, Hemolysate: 466.2 ng/mL
HEMATOCRIT: 27.8 % — AB (ref 34.0–46.6)

## 2015-01-19 MED ORDER — MORPHINE SULFATE (CONCENTRATE) 10 MG/0.5ML PO SOLN: 5.0000 mg | ORAL | Status: AC | PRN

## 2015-01-19 MED ORDER — MORPHINE SULFATE (CONCENTRATE) 10 MG/0.5ML PO SOLN
5.0000 mg | ORAL | Status: DC | PRN
  Administered 2015-01-19: 5 mg via ORAL
  Filled 2015-01-19: qty 0.5

## 2015-01-19 NOTE — Clinical Social Work Note (Addendum)
CSW spoke with the pt's husband Ray. Ray asked the CSW to make a [facility]. CSW spoke with Carley HammedEva [facility]. CSW informed Carley Hammedva that the family and palliative care to has agreed that the pt is hospice appropriate. Carley Hammedva informed the CSW that she will review the pt's chart and notify her team. CSW is awaiting a call from Carley Hammedva to determine if they can accept the pt.   Addendum: CSW spoke with Ray [facility]. CSW and Ray discussed ambulance transport. CSW contact [facility] at [phone] to schedule transport for the pt. Bedside RN can call reported to [phone].   Micha Erck, MSW, LCSWA [phone]

## 2015-01-19 NOTE — Progress Notes (Signed)
Sharon [name] 4N-Room 32-[facility] RN Visit-Sharon Meredith ModyStein RN, BSN  This is a related, covered admission to her [facility] diagnosis of intracerebral hemorrhage. Patient is a DNR.   Patient seen in room with facial grimacing noted.  Patient appears uncomfortable.  2 doses of 650mg  Tylenol given for pain the past 24 hours. Spoke with Chrissie NoaWilliam, RN and encouraged to give liquid Morphine as ordered.  Plan [facility].  [facility] social worker to speak with husband later this morning.  Hospital social worker aware.  Please send signed gold DNR and discharge instructions with patient.  Please call report to [facility] RN at [phone] at time of transfer.  Please call with any questions.  Chelsea AusStacie Stein RN, BSN [facility] Hospital Liaison [phone]

## 2015-01-19 NOTE — Discharge Summary (Signed)
[facility]  Physician Discharge Summary   Patient ID: Sharon [name] MRN: [id number] DOB/AGE: 1946/07/09 69 y.o.  Admit date: 01/16/2015 Discharge date: 01/19/2015  PCP: Sharon Pounds, Sharon [name]  DISCHARGE DIAGNOSES:  Principal Problem:   Acute bilateral subdural hematomas Active Problems:   History of traumatic brain injury/ICH   Dysphagia, pharyngoesophageal phase   Dementia with behavioral disturbance   Seizure disorder   Urinary frequency   Bipolar disorder, mixed   Acquired pancytopenia   Altered mental status   RECOMMENDATIONS FOR OUTPATIENT FOLLOW UP: Patient being discharged to [facility] facility.  DISCHARGE CONDITION: poor  Diet recommendation: Comfort feeds as tolerated.  Filed Weights   01/16/15 1835  Weight: 50 kg (110 lb 3.7 oz)    INITIAL HISTORY: 69 year old female with a history of traumatic brain injury after a fall in April 2016, presented after she had a witnessed seizure at home. Patient was found to have acute bilateral subdural hematomas. Patient was admitted to the hospital for further management. Patient was also noted to have a right proximal humerus fracture.  Consultations: Orthopedics Neurosurgery over the phone   HOSPITAL COURSE:   Traumatic subdural hematoma Neurosurgery was consulted by EDP, did not feel the patient needed acute surgical intervention. Repeat CT brain 01/18/2015 showed stable right side subdural hematoma; left frontotemporal subdural with shifting of hyperdense component. Physical therapy recommended skilled nursing facility. Speech therapy recommends dysphagia 3 diet with thin liquids.   Seizure disorder  Repeat EEG--no focal epileptiform discharges/seizure predisposition. Continue Keppra.   Transient Loss of Consciousness/Orthostatic hypotension Suspect due to orthostasis. Improved.  Right Humeral Fracture Dr. Gearldine Shown not feel that the patient needed operative management. Remain in sling for 6  weeks  Dysphagia Speech recommends Dys 3 with thins  Dementia with behavioral disturbance Stable. Continue current medications.  Bipolar disorder, mixed Stable. Continue current medications.  Urinary frequency Continue preadmission [facility] noting automatic therapeutic substitution. Continue Flomax as well. Urinalysis negative for pyuria  Acquired pancytopenia This is been chronic dating back to April 2008, with intermittent worsening due to consumptive process. Suspect medication effect.   Goals of care Patient was seen by palliative medicine. After discussions with husband patient will be discharged to residential hospice as her prognosis is poor. She is DO NOT RESUSCITATE.  Overall, stable. Prognosis is poor. Okay for discharge to residential hospice today.    PERTINENT LABS:  The results of significant diagnostics from this hospitalization (including imaging, microbiology, ancillary and laboratory) are listed below for reference.    Microbiology: Recent Results (from the past 240 hour(s))  MRSA PCR Screening     Status: None   Collection Time: 01/16/15  7:00 PM  Result Value Ref Range Status   MRSA by PCR NEGATIVE NEGATIVE Final    Comment:        The GeneXpert MRSA Assay (FDA approved for NASAL specimens only), is one component of a comprehensive MRSA colonization surveillance program. It is not intended to diagnose MRSA infection nor to guide or monitor treatment for MRSA infections.      Labs: Basic Metabolic Panel:  Recent Labs Lab 01/16/15 1517  NA 141  K 4.0  CL 109  CO2 26  GLUCOSE 140*  BUN 12  CREATININE 0.60  CALCIUM 9.0   Liver Function Tests:  Recent Labs Lab 01/16/15 1517  AST 22  ALT 32  ALKPHOS 104  BILITOT 0.8  PROT 6.1*  ALBUMIN 3.8   CBC:  Recent Labs Lab 01/16/15 1517 01/18/15 0650  WBC 3.1*  1.6*  HGB 10.7* 9.4*  HCT 31.4* 27.5*  MCV 87.7 86.8  PLT 105* 112*    IMAGING STUDIES Dg Shoulder Right  01/16/2015    CLINICAL DATA:  Trauma right shoulder pain hematoma on right arm  EXAM: RIGHT SHOULDER - 2+ VIEW  COMPARISON:  None.  FINDINGS: Nondisplaced fracture through the surgical neck of the humerus. Humerus appears appropriately located relative to the glenoid.  IMPRESSION: Acute fracture surgical neck of the humerus   Electronically Signed   By: Esperanza Heir [name].D.   On: 01/16/2015 17:55   Ct Head Wo Contrast  01/18/2015   CLINICAL DATA:  69 year old female fall 2 days ago in 3 months ago. Follow-up subdural hematoma. Subsequent encounter.  EXAM: CT HEAD WITHOUT CONTRAST  TECHNIQUE: Contiguous axial images were obtained from the base of the skull through the vertex without intravenous contrast.  COMPARISON:  01/16/2015 and 10/14/2014.  FINDINGS: Loculated left frontal -temporal subdural hematoma with maximal thickness of 1.5 cm with overall similar size but with shifting of hyperdense component of left subdural hematoma. Local mass effect with midline shift to the right by 2.9 mm.  Right frontal broad-based subdural hematoma with maximal thickness of 4.3 mm without significant change.  Anterior left frontal moderate area of encephalomalacia and smaller area encephalomalacia right cerebellum without change and consistent with result of prior hematoma/ hemorrhagic contusion.  Complex fracture of the occipital bone/ skullbase greater on the right (extending to the clivus) noted as acute on 10/14/2014 and without significant change.  Small vessel disease type changes without CT evidence of large acute thrombotic infarct.  No intracranial mass lesion noted on this unenhanced exam.  Vascular calcifications.  IMPRESSION: When compared to the recent CT, there has been shifting of hyperdense component of loculated left frontal-temporal subdural hematoma with similar overall maximal thickness of 1.5 cm and 2.9 mm midline shift to the right.  No significant change right-sided subdural hematoma with maximal thickness of 4.3 mm.   Please see above.   Electronically Signed   By: Lacy Duverney [name].D.   On: 01/18/2015 06:56   Ct Head Wo Contrast  01/16/2015   CLINICAL DATA:  Took a fall 3 months ago. Suffered TBI. Patient has had seizure activity since the event. Event today while on the toilet began leaning and posturing. Fall yesterday to the right side.  EXAM: CT HEAD WITHOUT CONTRAST  TECHNIQUE: Contiguous axial images were obtained from the base of the skull through the vertex without intravenous contrast.  COMPARISON:  CT head 10/15/2014  FINDINGS: There is a left frontotemporal subdural hematoma measuring 1.5 cm maximum diameter. A smaller right frontotemporal subdural hematoma measures 4.3 mm. There is 3 mm of left-to-right midline shift.  Encephalomalacia is identified in the left frontal region and right cerebellum.  No significant change in right skullbase fracture as identified on studies in March. There is atherosclerotic calcification of the internal carotid arteries.  IMPRESSION: 1. New bilateral subdural hematomas, left greater than right, associated with minimal left-to-right midline shift. 2. No new calvarial fracture. 3. 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 4. Critical Value/emergent results were called by telephone at the time of interpretation on 01/16/2015 at 3:59 pm to Dr. Cathren Laine , who verbally acknowledged these results.   Electronically Signed   By: Norva Pavlov [name].D.   On: 01/16/2015 16:00    DISCHARGE EXAMINATION: Filed Vitals:   01/18/15 2123 01/19/15 0222 01/19/15 0704 01/19/15 1000  BP:  153/61 156/71 115/54 141/72  Pulse: 68 60 65 73  Temp: 98.6 F (37 C) 97.8 F (36.6 C) 97.5 F (36.4 C) 98.3 F (36.8 C)  TempSrc: Oral Oral Oral Oral  Resp: 16 18 18 19   Height:      Weight:      SpO2: 100% 100% 100% 100%   General appearance: alert, distracted and no distress Resp: clear to auscultation bilaterally Cardio:  regular rate and rhythm, S1, S2 normal, no murmur, click, rub or gallop GI: soft, non-tender; bowel sounds normal; no masses,  no organomegaly  DISPOSITION: Residential hospice  Discharge Instructions    Call Sharon [name] for:  severe uncontrolled pain    Complete by:  As directed      Diet general    Complete by:  As directed      Discharge instructions    Complete by:  As directed   You were cared for by a hospitalist during your hospital stay. If you have any questions about your discharge medications or the care you received while you were in the hospital after you are discharged, you can call the unit and asked to speak with the hospitalist on call if the hospitalist that took care of you is not available. Once you are discharged, your primary care physician will handle any further medical issues. Please note that NO REFILLS for any discharge medications will be authorized once you are discharged, as it is imperative that you return to your primary care physician (or establish a relationship with a primary care physician if you do not have one) for your aftercare needs so that they can reassess your need for medications and monitor your lab values. If you do not have a primary care physician, you can call [phone] for a physician referral.     Increase activity slowly    Complete by:  As directed            ALLERGIES: No Known Allergies   Current Discharge Medication List    START taking these medications   Details  Morphine Sulfate (MORPHINE CONCENTRATE) 10 MG/0.5ML SOLN concentrated solution Take 0.25-0.5 mLs (5-10 mg total) by mouth every 2 (two) hours as needed for moderate pain, severe pain, anxiety or shortness of breath. Qty: 42 mL      CONTINUE these medications which have NOT CHANGED   Details  acetaminophen (TYLENOL) 500 MG tablet Take 500 mg by mouth at bedtime as needed for moderate pain.    citalopram (CELEXA) 40 MG tablet Take 20 mg by mouth at bedtime.  Refills: 1    Associated Diagnoses: Anemia, unspecified anemia type    levETIRAcetam (KEPPRA) 500 MG tablet Take 1 tablet (500 mg total) by mouth 2 (two) times daily.    LORazepam (ATIVAN) 0.5 MG tablet Take 1 tablet by mouth 2 (two) times daily as needed. Refills: 1    QUEtiapine (SEROQUEL) 300 MG tablet Take 0.5 tablets (150 mg total) by mouth 2 (two) times daily.   Associated Diagnoses: Anemia, unspecified anemia type    tamsulosin (FLOMAX) 0.4 MG CAPS capsule Take 0.4 mg by mouth at bedtime.  Refills: 3   Associated Diagnoses: Anemia, unspecified anemia type    trospium (SANCTURA) 20 MG tablet Take 20 mg by mouth 2 (two) times daily.  Refills: 11   Associated Diagnoses: Anemia, unspecified anemia type      STOP taking these medications     aspirin EC 81 MG tablet      Cholecalciferol (VITAMIN D3)  1000 UNITS CAPS      ferrous sulfate 325 (65 FE) MG tablet      vitamin B-12 (CYANOCOBALAMIN) 500 MCG tablet      zinc sulfate 220 MG capsule      furosemide (LASIX) 20 MG tablet      lisinopril (PRINIVIL,ZESTRIL) 2.5 MG tablet        Follow-up Information    Call Sharon [name],Sharon [name], Sharon [name].   Specialty:  Internal Medicine   Why:  As needed   Contact information:   [address] [place] [address] [phone]       TOTAL DISCHARGE TIME: 35 minutes  [facility] Center[facility]ISHNAN,Ziyad Dyar  [facility] Pager [phone]  01/19/2015, 11:46 AM

## 2015-01-25 LAB — LEVETIRACETAM LEVEL: LEVETIRACETAM: 26 ug/mL (ref 10.0–40.0)

## 2015-02-01 ENCOUNTER — Telehealth: Payer: Self-pay | Admitting: *Deleted

## 2015-02-01 NOTE — Telephone Encounter (Signed)
Received call from husband Cornelius MorasOwen wanting to inform Dr. Mosetta PuttFeng re:  Pt had expired last Thursday.   Message sent to scheduler to cancel all appts. Owen's  Phone    [phone].

## 2015-02-21 DEATH — deceased

## 2016-05-11 IMAGING — US US PELVIS COMPLETE
1 series · 14 of 23 positions shown · non-contrast
Comparison: Abdomen films of 01/14/2014

CLINICAL DATA: Abdominal distension, weight loss

EXAM:
TRANSABDOMINAL ULTRASOUND OF PELVIS
TECHNIQUE: Transabdominal ultrasound examination of the pelvis was performed
including evaluation of the uterus, ovaries, adnexal regions, and
pelvic cul-de-sac. The patient refused the transvaginal portion of
the study.

[Series 1: us pelvis complete · 0.20mm/px · 14 of 23 slices shown]
[im 1/23]
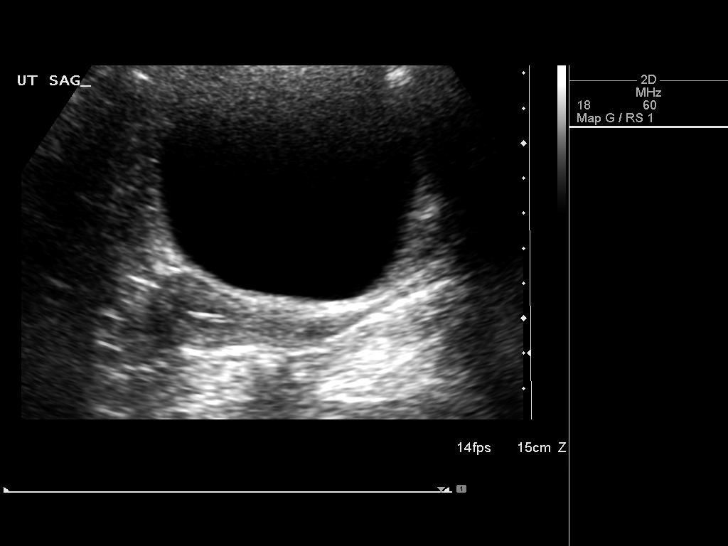
[im 3/23]
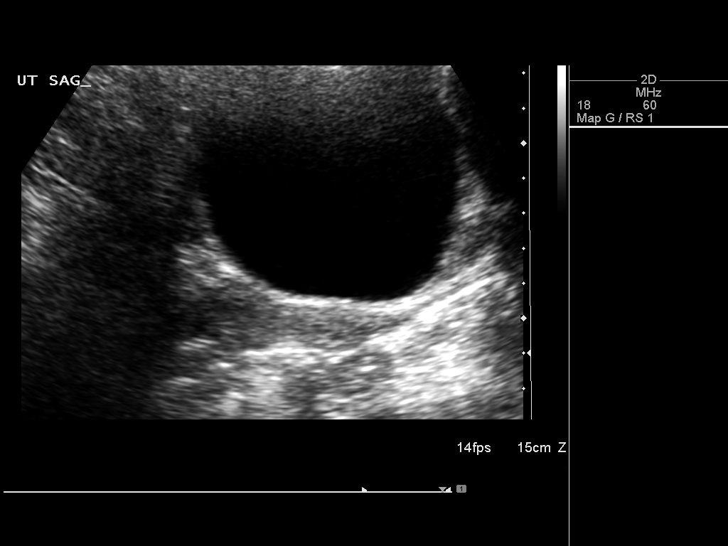
[im 5/23]
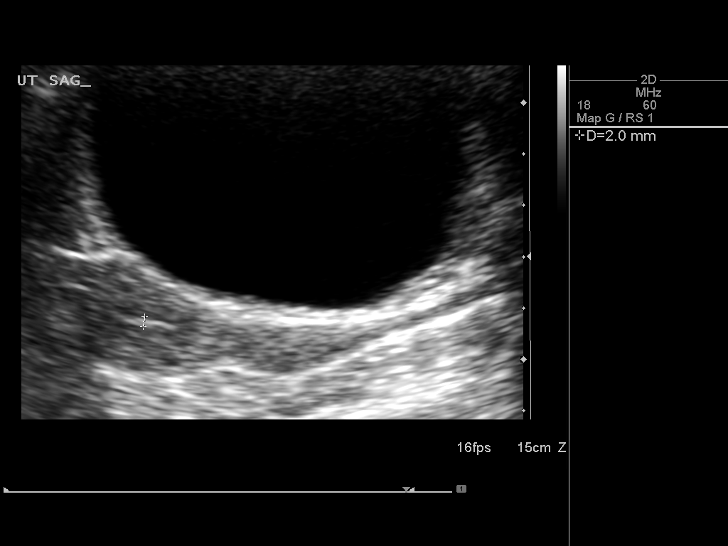
[im 6/23]
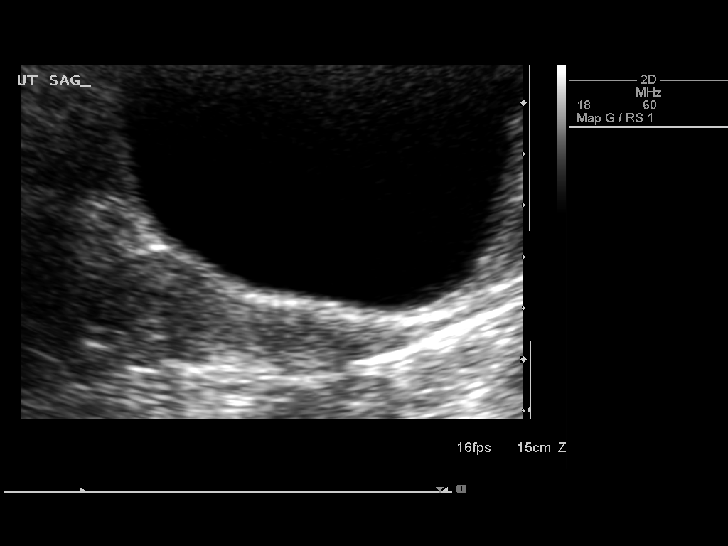
[im 8/23]
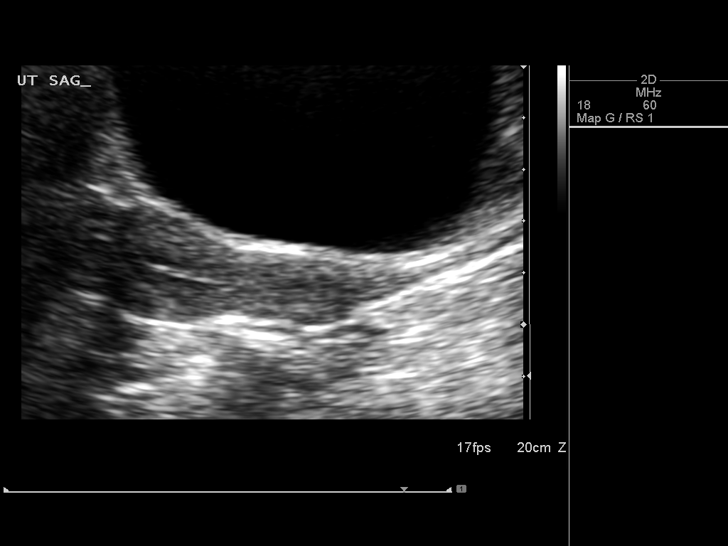
[im 10/23]
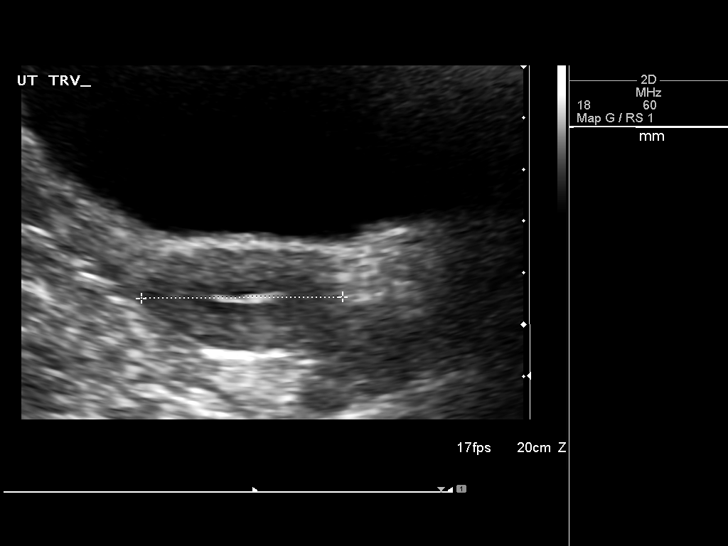
[im 11/23]
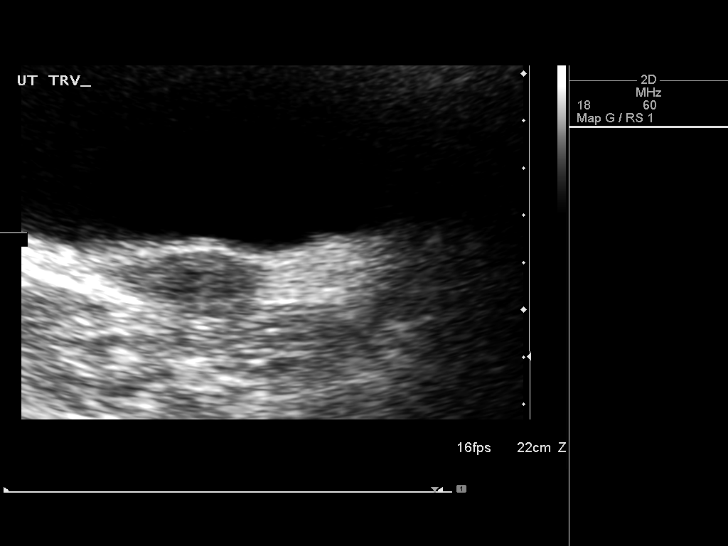
[im 13/23]
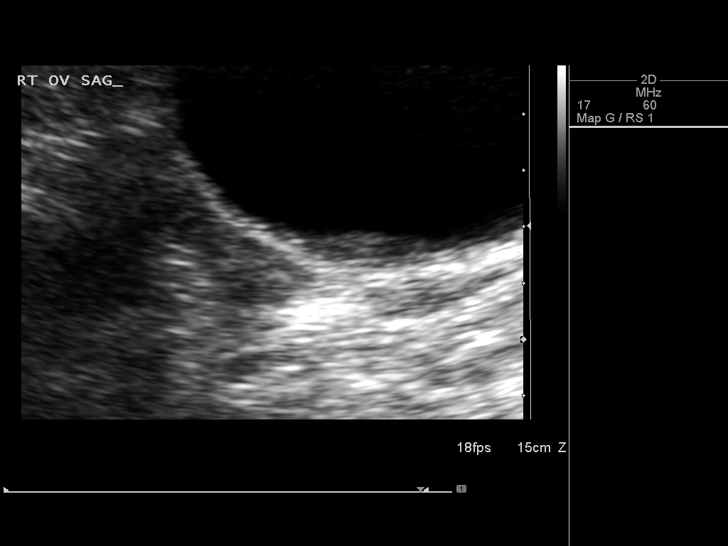
[im 14/23]
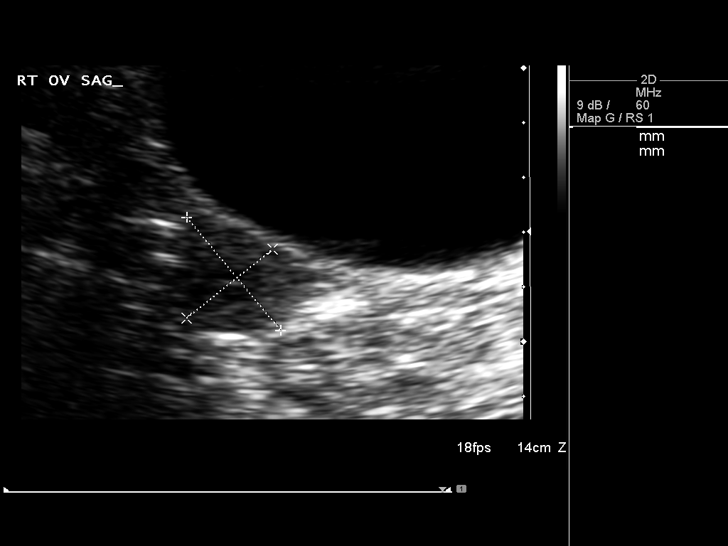
[im 16/23]
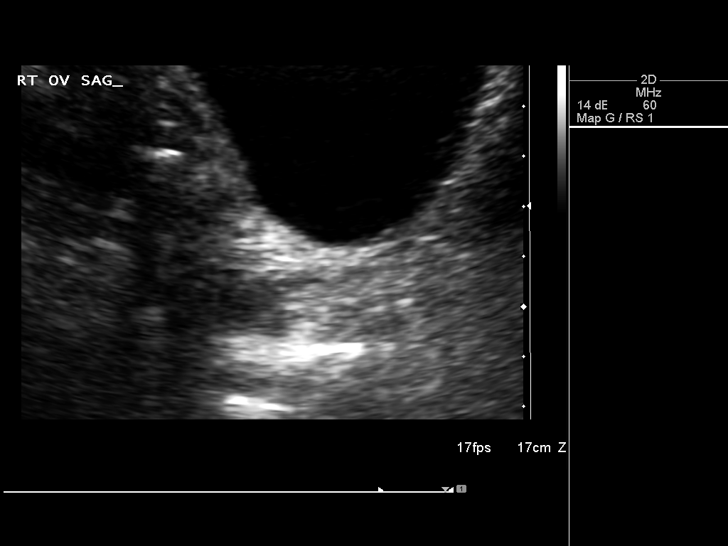
[im 18/23]
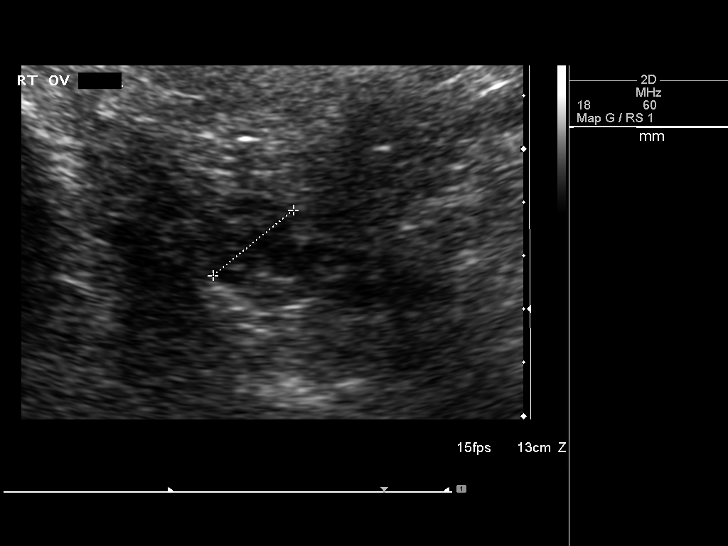
[im 19/23]
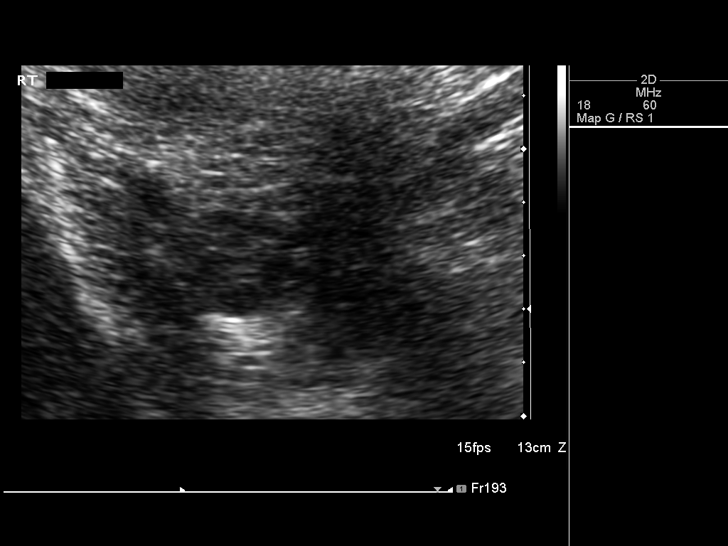
[im 21/23]
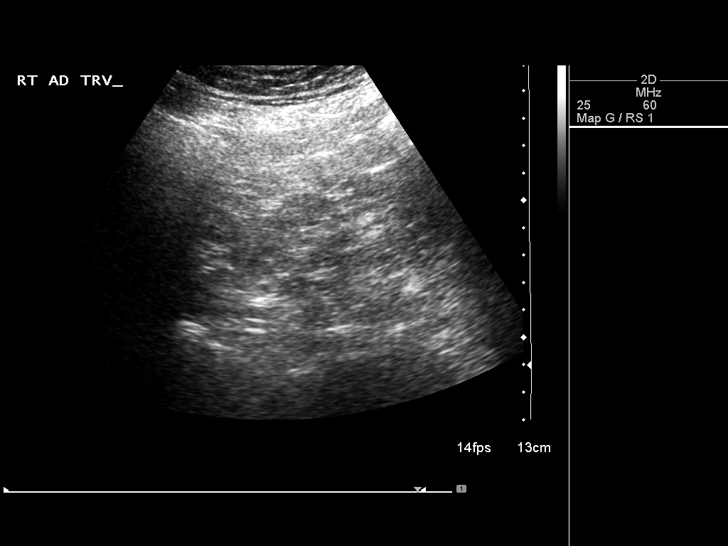
[im 23/23]
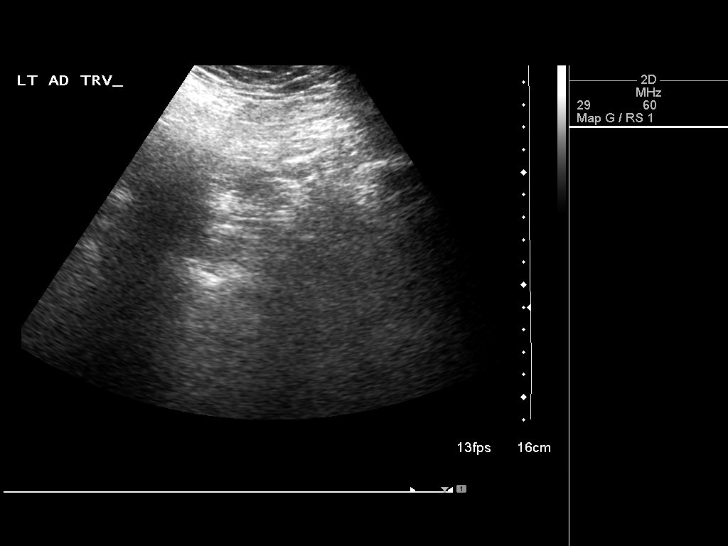

[14 of 23 positions shown; findings below may reference images not displayed]

FINDINGS: Uterus

Measurements: 5.4 x 2.1 x 3.9 cm.. No myometrial abnormality is
seen.

Endometrium

Thickness: 2 mm in thickness..  No focal abnormality is visualized.

Right ovary

Measurements: 2.7 x 2.0 x 1.9 cm.. No abnormality is noted.

Left ovary

Measurements: The left ovary is not visualized due to overlying
bowel gas..

Other findings:  No free fluid
IMPRESSION: 1. No pelvic mass or fluid collection.
2. The uterus and right ovary appear normal. The left ovary is
obscured by bowel gas.

## 2017-02-15 IMAGING — CR DG CHEST 1V PORT
2 series · 2 of 2 positions shown · non-contrast
Comparison: 10/20/2014.

CLINICAL DATA: Pulmonary edema.

EXAM:
PORTABLE CHEST - 1 VIEW

[AP (1 of 2)]
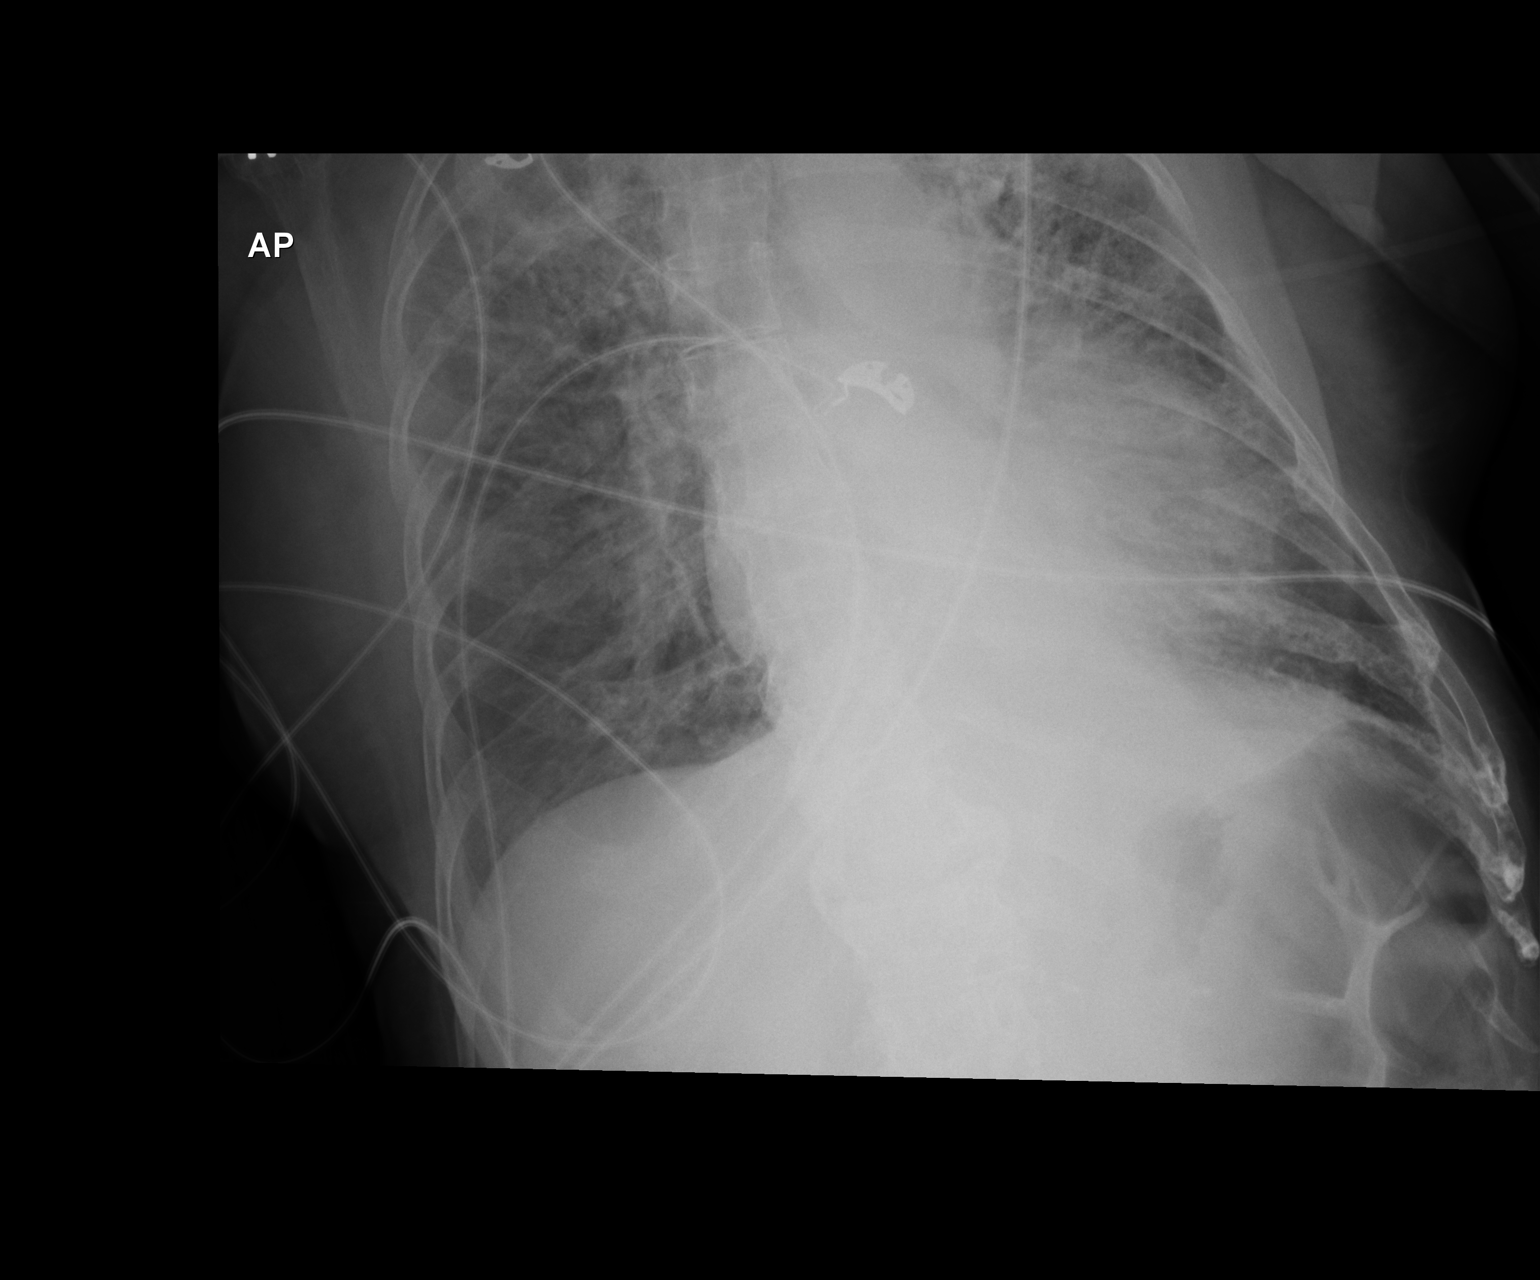

[AP (2 of 2)]
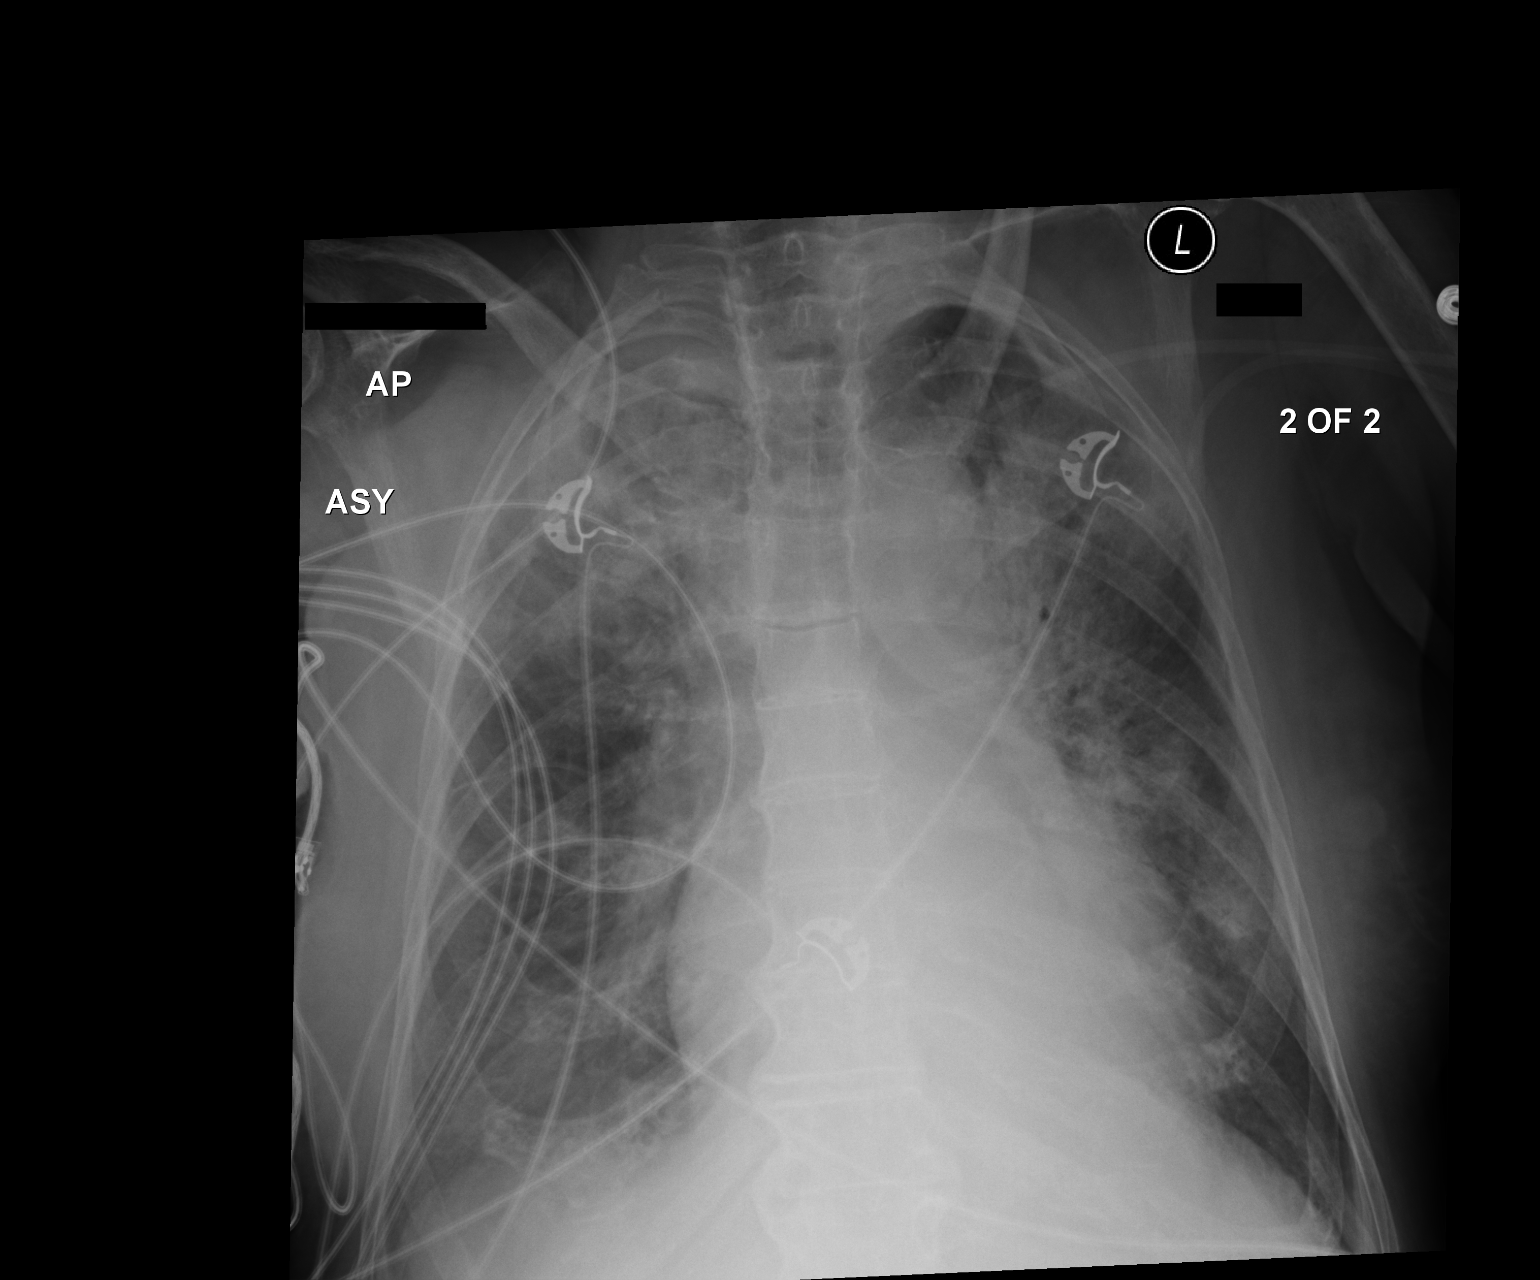

[2 of 2 positions shown; findings below may reference images not displayed]

FINDINGS: Mediastinum and hilar structures are stable. Persistent
cardiomegaly. Persistent but improving bilateral pulmonary
infiltrates/ edema. Interim clearing of pleural effusions. No
pneumothorax.
IMPRESSION: Congestive heart failure with pulmonary edema. Interim improvement
from prior exam.
